# Patient Record
Sex: Female | Born: 1975 | Race: White | Hispanic: No | Marital: Married | Smoking: Never smoker
Health system: Southern US, Community
[De-identification: ages and names within clinical notes are randomized; demographics above are authoritative.]

## PROBLEM LIST (undated history)

## (undated) DIAGNOSIS — D6859 Other primary thrombophilia: Secondary | ICD-10-CM

## (undated) DIAGNOSIS — F419 Anxiety disorder, unspecified: Secondary | ICD-10-CM

## (undated) DIAGNOSIS — I82409 Acute embolism and thrombosis of unspecified deep veins of unspecified lower extremity: Secondary | ICD-10-CM

## (undated) DIAGNOSIS — J939 Pneumothorax, unspecified: Secondary | ICD-10-CM

## (undated) DIAGNOSIS — D689 Coagulation defect, unspecified: Secondary | ICD-10-CM

## (undated) DIAGNOSIS — Z87442 Personal history of urinary calculi: Secondary | ICD-10-CM

## (undated) DIAGNOSIS — N189 Chronic kidney disease, unspecified: Secondary | ICD-10-CM

## (undated) HISTORY — DX: Pneumothorax, unspecified: J93.9

## (undated) HISTORY — DX: Coagulation defect, unspecified: D68.9

## (undated) HISTORY — DX: Other primary thrombophilia: D68.59

## (undated) HISTORY — DX: Chronic kidney disease, unspecified: N18.9

## (undated) HISTORY — DX: Anxiety disorder, unspecified: F41.9

## (undated) HISTORY — DX: Acute embolism and thrombosis of unspecified deep veins of unspecified lower extremity: I82.409

---
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
## 2005-07-18 ENCOUNTER — Ambulatory Visit: Payer: Self-pay | Admitting: Obstetrics and Gynecology

## 2005-07-28 ENCOUNTER — Ambulatory Visit: Payer: Self-pay | Admitting: Obstetrics and Gynecology

## 2005-11-20 ENCOUNTER — Inpatient Hospital Stay: Payer: Self-pay | Admitting: Obstetrics and Gynecology

## 2005-12-25 ENCOUNTER — Ambulatory Visit: Payer: Self-pay

## 2006-01-11 ENCOUNTER — Emergency Department: Payer: Self-pay | Admitting: Emergency Medicine

## 2006-01-12 ENCOUNTER — Ambulatory Visit: Payer: Self-pay | Admitting: Emergency Medicine

## 2006-03-23 ENCOUNTER — Ambulatory Visit: Payer: Self-pay | Admitting: Obstetrics and Gynecology

## 2006-05-19 ENCOUNTER — Inpatient Hospital Stay: Payer: Self-pay | Admitting: Obstetrics and Gynecology

## 2006-05-25 ENCOUNTER — Observation Stay: Payer: Self-pay | Admitting: Certified Nurse Midwife

## 2006-05-27 ENCOUNTER — Observation Stay: Payer: Self-pay

## 2006-05-28 ENCOUNTER — Observation Stay: Payer: Self-pay

## 2006-05-31 ENCOUNTER — Observation Stay: Payer: Self-pay

## 2006-06-01 ENCOUNTER — Ambulatory Visit: Payer: Self-pay

## 2006-06-09 ENCOUNTER — Ambulatory Visit: Payer: Self-pay | Admitting: Unknown Physician Specialty

## 2006-06-21 ENCOUNTER — Inpatient Hospital Stay: Payer: Self-pay | Admitting: Obstetrics and Gynecology

## 2006-06-24 ENCOUNTER — Ambulatory Visit: Payer: Self-pay | Admitting: Certified Nurse Midwife

## 2007-09-25 ENCOUNTER — Inpatient Hospital Stay: Payer: Self-pay | Admitting: Obstetrics and Gynecology

## 2007-10-03 ENCOUNTER — Encounter: Payer: Self-pay | Admitting: Maternal & Fetal Medicine

## 2007-10-10 ENCOUNTER — Encounter: Payer: Self-pay | Admitting: Maternal & Fetal Medicine

## 2008-02-02 ENCOUNTER — Observation Stay: Payer: Self-pay | Admitting: Obstetrics and Gynecology

## 2008-05-07 ENCOUNTER — Inpatient Hospital Stay: Payer: Self-pay | Admitting: Obstetrics and Gynecology

## 2010-05-19 ENCOUNTER — Ambulatory Visit: Payer: Self-pay | Admitting: Obstetrics and Gynecology

## 2010-05-25 ENCOUNTER — Ambulatory Visit: Payer: Self-pay | Admitting: Obstetrics and Gynecology

## 2010-10-04 ENCOUNTER — Ambulatory Visit: Payer: Self-pay | Admitting: Unknown Physician Specialty

## 2010-10-07 ENCOUNTER — Ambulatory Visit: Payer: Self-pay | Admitting: Otolaryngology

## 2010-10-13 ENCOUNTER — Ambulatory Visit: Payer: Self-pay | Admitting: Cardiothoracic Surgery

## 2010-10-14 LAB — AFP TUMOR MARKER: AFP-Tumor Marker: 1.6 ng/mL (ref 0.0–8.3)

## 2010-10-19 ENCOUNTER — Ambulatory Visit: Payer: Self-pay | Admitting: Cardiothoracic Surgery

## 2010-12-29 ENCOUNTER — Ambulatory Visit: Payer: Self-pay | Admitting: Cardiothoracic Surgery

## 2011-01-17 ENCOUNTER — Ambulatory Visit: Payer: Self-pay | Admitting: Cardiothoracic Surgery

## 2011-04-19 ENCOUNTER — Ambulatory Visit: Payer: Self-pay | Admitting: Oncology

## 2011-04-24 ENCOUNTER — Encounter: Payer: Self-pay | Admitting: Obstetrics and Gynecology

## 2011-04-30 ENCOUNTER — Inpatient Hospital Stay: Payer: Self-pay | Admitting: Obstetrics and Gynecology

## 2011-05-08 ENCOUNTER — Ambulatory Visit: Payer: Self-pay | Admitting: Obstetrics and Gynecology

## 2011-05-08 ENCOUNTER — Observation Stay: Payer: Self-pay | Admitting: Obstetrics and Gynecology

## 2011-05-20 ENCOUNTER — Ambulatory Visit: Payer: Self-pay | Admitting: Oncology

## 2011-05-25 ENCOUNTER — Ambulatory Visit: Payer: Self-pay | Admitting: Oncology

## 2011-06-01 ENCOUNTER — Encounter: Payer: Self-pay | Admitting: Obstetrics & Gynecology

## 2011-06-19 ENCOUNTER — Ambulatory Visit: Payer: Self-pay | Admitting: Oncology

## 2011-06-21 LAB — HM MAMMOGRAPHY: HM Mammogram: NORMAL

## 2011-07-20 ENCOUNTER — Ambulatory Visit: Payer: Self-pay | Admitting: Oncology

## 2011-08-13 ENCOUNTER — Observation Stay: Payer: Self-pay | Admitting: Obstetrics and Gynecology

## 2011-08-31 ENCOUNTER — Ambulatory Visit: Payer: Self-pay | Admitting: Oncology

## 2011-09-19 ENCOUNTER — Ambulatory Visit: Payer: Self-pay | Admitting: Oncology

## 2011-09-19 ENCOUNTER — Ambulatory Visit: Payer: Self-pay | Admitting: Internal Medicine

## 2011-10-20 ENCOUNTER — Ambulatory Visit: Payer: Self-pay | Admitting: Oncology

## 2011-11-17 ENCOUNTER — Ambulatory Visit: Payer: Self-pay | Admitting: Oncology

## 2011-12-01 ENCOUNTER — Inpatient Hospital Stay: Payer: Self-pay | Admitting: Obstetrics and Gynecology

## 2011-12-01 LAB — CBC WITH DIFFERENTIAL/PLATELET
Basophil %: 0.1 %
Eosinophil #: 0.1 10*3/uL (ref 0.0–0.7)
Eosinophil %: 0.6 %
Lymphocyte #: 1.7 10*3/uL (ref 1.0–3.6)
MCH: 26.6 pg (ref 26.0–34.0)
MCHC: 32.4 g/dL (ref 32.0–36.0)
MCV: 82 fL (ref 80–100)
Monocyte #: 0.7 10*3/uL (ref 0.0–0.7)
Neutrophil %: 76.7 %
Platelet: 226 10*3/uL (ref 150–440)
RBC: 4.61 10*6/uL (ref 3.80–5.20)
RDW: 14.8 % — ABNORMAL HIGH (ref 11.5–14.5)
WBC: 10.8 10*3/uL (ref 3.6–11.0)

## 2011-12-02 LAB — HEMATOCRIT: HCT: 33.9 % — ABNORMAL LOW (ref 35.0–47.0)

## 2012-02-15 ENCOUNTER — Ambulatory Visit: Payer: Self-pay | Admitting: Cardiothoracic Surgery

## 2012-02-15 ENCOUNTER — Ambulatory Visit: Payer: Self-pay | Admitting: Oncology

## 2012-02-17 ENCOUNTER — Ambulatory Visit: Payer: Self-pay

## 2012-02-17 ENCOUNTER — Ambulatory Visit: Payer: Self-pay | Admitting: Oncology

## 2012-05-09 ENCOUNTER — Ambulatory Visit: Payer: Self-pay | Admitting: Oncology

## 2012-05-19 ENCOUNTER — Ambulatory Visit: Payer: Self-pay | Admitting: Oncology

## 2012-06-18 ENCOUNTER — Ambulatory Visit: Payer: Self-pay | Admitting: Oncology

## 2012-06-20 LAB — HM PAP SMEAR: HM Pap smear: NORMAL

## 2012-09-18 DIAGNOSIS — N189 Chronic kidney disease, unspecified: Secondary | ICD-10-CM

## 2012-09-18 HISTORY — DX: Chronic kidney disease, unspecified: N18.9

## 2012-12-17 ENCOUNTER — Emergency Department: Payer: Self-pay | Admitting: Emergency Medicine

## 2012-12-17 LAB — COMPREHENSIVE METABOLIC PANEL
Albumin: 3.7 g/dL (ref 3.4–5.0)
Anion Gap: 10 (ref 7–16)
BUN: 10 mg/dL (ref 7–18)
Bilirubin,Total: 0.7 mg/dL (ref 0.2–1.0)
Calcium, Total: 8.2 mg/dL — ABNORMAL LOW (ref 8.5–10.1)
Chloride: 107 mmol/L (ref 98–107)
Co2: 22 mmol/L (ref 21–32)
EGFR (African American): >60
EGFR (Non-African Amer.): >60
Glucose: 120 mg/dL — ABNORMAL HIGH (ref 65–99)
Potassium: 3.3 mmol/L — ABNORMAL LOW (ref 3.5–5.1)
SGOT(AST): 22 U/L (ref 15–37)
Sodium: 139 mmol/L (ref 136–145)
Total Protein: 6.6 g/dL (ref 6.4–8.2)

## 2012-12-17 LAB — URINALYSIS, COMPLETE
Bacteria: NONE SEEN
Bilirubin,UR: NEGATIVE
Glucose,UR: NEGATIVE mg/dL (ref 0–75)
Leukocyte Esterase: NEGATIVE
Ph: 5 (ref 4.5–8.0)
Protein: 100
Specific Gravity: 1.017 (ref 1.003–1.030)
WBC UR: 10 /HPF (ref 0–5)

## 2012-12-17 LAB — PREGNANCY, URINE: Pregnancy Test, Urine: NEGATIVE m[IU]/mL

## 2012-12-17 LAB — CBC
HCT: 43.7 % (ref 35.0–47.0)
HGB: 14.9 g/dL (ref 12.0–16.0)
MCV: 89 fL (ref 80–100)
Platelet: 180 10*3/uL (ref 150–440)
RBC: 4.94 10*6/uL (ref 3.80–5.20)
RDW: 13.7 % (ref 11.5–14.5)
WBC: 5.4 10*3/uL (ref 3.6–11.0)

## 2012-12-17 LAB — LIPASE, BLOOD: Lipase: 124 U/L (ref 73–393)

## 2012-12-26 ENCOUNTER — Ambulatory Visit: Payer: Self-pay | Admitting: Cardiothoracic Surgery

## 2013-01-02 ENCOUNTER — Ambulatory Visit: Payer: Self-pay

## 2013-01-16 ENCOUNTER — Ambulatory Visit: Payer: Self-pay | Admitting: Cardiothoracic Surgery

## 2013-01-16 ENCOUNTER — Ambulatory Visit: Payer: Self-pay | Admitting: Oncology

## 2013-02-21 ENCOUNTER — Emergency Department: Payer: Self-pay | Admitting: Emergency Medicine

## 2013-02-21 LAB — BASIC METABOLIC PANEL
Anion Gap: 7 (ref 7–16)
Calcium, Total: 8.2 mg/dL — ABNORMAL LOW (ref 8.5–10.1)
Chloride: 108 mmol/L — ABNORMAL HIGH (ref 98–107)
Co2: 25 mmol/L (ref 21–32)
EGFR (Non-African Amer.): >60
Glucose: 88 mg/dL (ref 65–99)
Osmolality: 279 (ref 275–301)
Potassium: 3.3 mmol/L — ABNORMAL LOW (ref 3.5–5.1)
Sodium: 140 mmol/L (ref 136–145)

## 2013-02-21 LAB — CBC
HCT: 42.4 % (ref 35.0–47.0)
HGB: 14.4 g/dL (ref 12.0–16.0)
MCH: 29.8 pg (ref 26.0–34.0)
MCV: 88 fL (ref 80–100)
Platelet: 157 10*3/uL (ref 150–440)
RDW: 13.5 % (ref 11.5–14.5)
WBC: 5.1 10*3/uL (ref 3.6–11.0)

## 2013-02-21 LAB — TROPONIN I: Troponin-I: <0.02 ng/mL

## 2013-02-21 LAB — CK TOTAL AND CKMB (NOT AT ARMC): CK-MB: 1 ng/mL (ref 0.5–3.6)

## 2013-02-21 LAB — PROTIME-INR: Prothrombin Time: 13.7 secs (ref 11.5–14.7)

## 2013-06-11 ENCOUNTER — Ambulatory Visit: Payer: Self-pay | Admitting: Internal Medicine

## 2013-07-21 ENCOUNTER — Encounter (INDEPENDENT_AMBULATORY_CARE_PROVIDER_SITE_OTHER): Payer: Self-pay

## 2013-07-21 ENCOUNTER — Encounter: Payer: Self-pay | Admitting: Internal Medicine

## 2013-07-21 ENCOUNTER — Ambulatory Visit (INDEPENDENT_AMBULATORY_CARE_PROVIDER_SITE_OTHER): Payer: Managed Care, Other (non HMO) | Admitting: Internal Medicine

## 2013-07-21 VITALS — BP 102/76 | HR 69 | Temp 98.7°F | Resp 12 | Ht 61.0 in | Wt 111.5 lb

## 2013-07-21 DIAGNOSIS — Z13220 Encounter for screening for lipoid disorders: Secondary | ICD-10-CM

## 2013-07-21 DIAGNOSIS — Z86711 Personal history of pulmonary embolism: Secondary | ICD-10-CM | POA: Insufficient documentation

## 2013-07-21 DIAGNOSIS — R079 Chest pain, unspecified: Secondary | ICD-10-CM

## 2013-07-21 DIAGNOSIS — Z7901 Long term (current) use of anticoagulants: Secondary | ICD-10-CM

## 2013-07-21 DIAGNOSIS — Z1239 Encounter for other screening for malignant neoplasm of breast: Secondary | ICD-10-CM | POA: Insufficient documentation

## 2013-07-21 DIAGNOSIS — Z23 Encounter for immunization: Secondary | ICD-10-CM

## 2013-07-21 DIAGNOSIS — I499 Cardiac arrhythmia, unspecified: Secondary | ICD-10-CM | POA: Insufficient documentation

## 2013-07-21 DIAGNOSIS — D6859 Other primary thrombophilia: Secondary | ICD-10-CM

## 2013-07-21 DIAGNOSIS — E329 Disease of thymus, unspecified: Secondary | ICD-10-CM

## 2013-07-21 DIAGNOSIS — R5381 Other malaise: Secondary | ICD-10-CM

## 2013-07-21 LAB — CBC WITH DIFFERENTIAL/PLATELET
Basophils Absolute: 0 10*3/uL (ref 0.0–0.1)
Basophils Relative: 0.6 % (ref 0.0–3.0)
Eosinophils Absolute: 0.2 10*3/uL (ref 0.0–0.7)
HCT: 46.6 % — ABNORMAL HIGH (ref 36.0–46.0)
Lymphs Abs: 1.7 10*3/uL (ref 0.7–4.0)
MCHC: 34.3 g/dL (ref 30.0–36.0)
Monocytes Absolute: 0.3 10*3/uL (ref 0.1–1.0)
Monocytes Relative: 6.9 % (ref 3.0–12.0)
Neutro Abs: 2.8 10*3/uL (ref 1.4–7.7)
Neutrophils Relative %: 55.6 % (ref 43.0–77.0)
Platelets: 187 10*3/uL (ref 150.0–400.0)
RDW: 13.2 % (ref 11.5–14.6)
WBC: 5.1 10*3/uL (ref 4.5–10.5)

## 2013-07-21 LAB — LIPID PANEL
HDL: 54.5 mg/dL (ref >39.00)
LDL Cholesterol: 120 mg/dL — ABNORMAL HIGH (ref 0–99)
Total CHOL/HDL Ratio: 3
Triglycerides: 55 mg/dL (ref 0.0–149.0)
VLDL: 11 mg/dL (ref 0.0–40.0)

## 2013-07-21 LAB — COMPREHENSIVE METABOLIC PANEL
ALT: 17 U/L (ref 0–35)
AST: 26 U/L (ref 0–37)
Alkaline Phosphatase: 55 U/L (ref 39–117)
CO2: 26 mEq/L (ref 19–32)
Calcium: 8.8 mg/dL (ref 8.4–10.5)
Chloride: 106 mEq/L (ref 96–112)
Creatinine, Ser: 0.8 mg/dL (ref 0.4–1.2)
Sodium: 139 mEq/L (ref 135–145)
Total Bilirubin: 0.7 mg/dL (ref 0.3–1.2)
Total Protein: 6.9 g/dL (ref 6.0–8.3)

## 2013-07-21 LAB — BRAIN NATRIURETIC PEPTIDE: Pro B Natriuretic peptide (BNP): 12 pg/mL (ref 0.0–100.0)

## 2013-07-21 NOTE — Patient Instructions (Signed)
Baseline EKG to be done today   Referral to [facility]  Mammogram TBD   Fasting labs today

## 2013-07-21 NOTE — Assessment & Plan Note (Signed)
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  Referring to cardiology for evaluation .

## 2013-07-21 NOTE — Assessment & Plan Note (Signed)
Suggested by history of bradycardia occurring with exertion and tachycardia at rest.  Short PR interval noted on today's EKG.  Refer to Cardiology for evaluation .

## 2013-07-21 NOTE — Assessment & Plan Note (Signed)
Per patient, has had unchanged thymus gland enlargement for 20 years. 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

## 2013-07-21 NOTE — Assessment & Plan Note (Addendum)
complicated by pregnancy.  Prior DVT/PEs despite use of once daily Lovenox. Followed by tim Orlie Dakin

## 2013-07-21 NOTE — Progress Notes (Signed)
Patient ID: Sabrina Woodard, female   DOB: 1976-04-18, 37 y.o.   MRN: [id number]   Patient Active Problem List   Diagnosis Date Noted  . Recurrent chest pain 07/21/2013  . Arrhythmia 07/21/2013  . 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 Hx pulmonary embolism 07/21/2013  . History of pulmonary embolism 07/21/2013    Subjective:  CC:   Chief Complaint  Patient presents with  . Establish Care    HPI:   Sabrina Woodard is a 37 y.o. female who presents as a new patient to establish primary care with the chief complaint of  Chest pain , recurrent.  She has a history of Protein C deficiency with recurrent DVT/PE, last one Spring  2014 , who has been having episodes of  chest tightnessand pain with exertion  On and off since her last PE.Marland Kitchen  She went to the ER In June and PE was ruled out,  EKG done, but o other workjp done . She recalls that during last episode of evaluation she was tachycardic at rest but became bradycardic with exertion.  Last episode occurred about 3 weeks ago while playing basket ball with the kids.   Protein C deficiency was found in 1999 during pregnancy;  Did not miscarry but had subsequent miscarriages  X 2 while on coumadin  History of PE in 1999 and in  2014, recently event  occurred during a sedentary period despite use of once daily lovenox .  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 No prior biopsy or bronchoscopy.  Had serologic workup for MG and CA.   5 children , oldest is 15  hoemschooling the 3 youngest.   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 Urged to have primary care by GYN .  Was due in October for her annual. Clois Comber at [place],  No history of abnormals. Last mammogram  2012 at [facility]  No history of biopsies         Past Medical History  Diagnosis Date  . Chronic kidney disease 2014    kidney  stones  . Clotting disorder 1995    Protien C deficcency    Past Surgical History  Procedure Laterality Date  . Tonsillectomy and adenoidectomy Bilateral 1995    Family History  Problem Relation Age of Onset  . Hyperlipidemia Mother   . Hypertension Mother   . Cancer Father 42    prostate   . Hyperlipidemia Father   . Hypertension Father   . Stroke Maternal Grandfather   . Cancer Paternal Grandmother 60    breast ca   . COPD Paternal Grandfather     History   Social History  . Marital Status: Married    Spouse Name: N/A    Number of Children: N/A  . Years of Education: N/A   Occupational History  . Not on file.   Social History Main Topics  . Smoking status: Never Smoker   . Smokeless tobacco: Never Used  . Alcohol Use: No  . Drug Use: No  . Sexual Activity: Yes    Birth Control/ Protection: Other-see comments     Comment: husband has had a vasectomy   Other Topics Concern  . Not on file   Social History Narrative  . No narrative on file   Allergies  Allergen Reactions  .  Penicillins Anaphylaxis  . Sulfa Antibiotics Rash     Review of Systems:   Patient denies headache, fevers, malaise, unintentional weight loss, skin rash, eye pain, sinus congestion and sinus pain, sore throat, dysphagia,  hemoptysis , cough, dyspnea, wheezing, chest pain, palpitations, orthopnea, edema, abdominal pain, nausea, melena, diarrhea, constipation, flank pain, dysuria, hematuria, urinary  Frequency, nocturia, numbness, tingling, seizures,  Focal weakness, Loss of consciousness,  Tremor, insomnia, depression, anxiety, and suicidal ideation.     Objective:  BP 102/76  Pulse 69  Temp(Src) 98.7 F (37.1 C) (Oral)  Resp 12  Ht 5\' 1"  (1.549 m)  Wt 111 lb 8 oz (50.576 kg)  BMI 21.08 kg/m2  SpO2 99%  LMP 07/19/2013  General appearance: alert, cooperative and appears stated age Ears: normal TM's and external ear canals both ears Throat: lips, mucosa, and tongue normal;  teeth and gums normal Neck: no adenopathy, no carotid bruit, supple, symmetrical, trachea midline and thyroid not enlarged, symmetric, no tenderness/mass/nodules Back: symmetric, no curvature. ROM normal. No CVA tenderness. Lungs: clear to auscultation bilaterally Heart: regular rate and rhythm, S1, S2 normal, no murmur, click, rub or gallop Abdomen: soft, non-tender; bowel sounds normal; no masses,  no organomegaly Pulses: 2+ and symmetric Skin: Skin color, texture, turgor normal. No rashes or lesions Lymph nodes: Cervical, supraclavicular, and axillary nodes normal.  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  Referring to cardiology for evaluation .   Arrhythmia Suggested by history of bradycardia occurring with exertion and tachycardia at rest.  Short PR interval noted on today's EKG.  Refer to Cardiology for evaluation .   Protein C deficiency complicated by pregnancy.  Prior DVT/PEs despite use of once daily Lovenox. Followed by tim Finnegan  History of pulmonary embolism Secondary to protein c deficiency . complicated by pregnancy .  Continue lovenox daily.  Patient has prevented pregnancy by having husband treated with vasectomy.  Unspecified disease of thymus gland Per patient, has had unchanged thymus gland enlargement for 20 years. Prior workup for MG and thyroid Ca negative   Breast cancer screening Advised to contineu annual mammograms,.. Overdue now ,.  Ordered.    Updated Medication List Outpatient Encounter Prescriptions as of 07/21/2013  Medication Sig  . enoxaparin (LOVENOX) 80 MG/0.8ML injection Inject 80 mg into the skin daily.

## 2013-07-21 NOTE — Assessment & Plan Note (Signed)
Advised to contineu annual mammograms,.. Overdue now ,.  Ordered.

## 2013-07-21 NOTE — Assessment & Plan Note (Addendum)
Secondary to protein c deficiency . complicated by pregnancy .  Continue lovenox daily.  Patient has prevented pregnancy by having husband treated with vasectomy.

## 2013-07-22 ENCOUNTER — Encounter: Payer: Self-pay | Admitting: *Deleted

## 2013-07-23 ENCOUNTER — Encounter: Payer: Self-pay | Admitting: Cardiovascular Disease

## 2013-07-23 ENCOUNTER — Ambulatory Visit (INDEPENDENT_AMBULATORY_CARE_PROVIDER_SITE_OTHER): Payer: Managed Care, Other (non HMO) | Admitting: Cardiovascular Disease

## 2013-07-23 VITALS — BP 116/82 | HR 69 | Ht 61.0 in | Wt 111.5 lb

## 2013-07-23 DIAGNOSIS — R079 Chest pain, unspecified: Secondary | ICD-10-CM

## 2013-07-23 DIAGNOSIS — R002 Palpitations: Secondary | ICD-10-CM

## 2013-07-23 DIAGNOSIS — Z86711 Personal history of pulmonary embolism: Secondary | ICD-10-CM

## 2013-07-23 NOTE — Assessment & Plan Note (Signed)
Sabrina Woodard presents today for further evaluation of 2 episodes of chest discomfort. These occurred when she was playing with her children. Suspected she may have some mild pulmonary hypertension due to her previous pulmonary block. Tuesday doubt that she has any ischemic heart disease.  We'll get an echocardiogram for further evaluation of her left ventricular function, right ventricular size and function and her pulmonary pressures. She's currently on daily Lovenox. I questioned her if she ever consider starting one of the new anticoagulants which should be a perfect to use in protein C deficiency.  I will see her  back in the office in 3 months.

## 2013-07-23 NOTE — Progress Notes (Signed)
     Sabrina Woodard Date of Birth  22-Jul-1976       [facility] [address]  [address] [place], [place]  [address]   [place], [place]  [address] [phone]     [phone]   Fax  [phone]    Fax [phone]  Problem List: 1. Protein C deficiency-history of recurrent pulmonary 2. Chest pain  History of Present Illness:  Sabrina Woodard is a 37 yo who is referred for evaluation of chest pain while exercising.  The pain was mild.  The pain lasted about 5-10 minutes.  She has occasional episodes at rest. She had some pleuretic cp, no dizziness.   She has been noted to have resting tachycardia.    She has had a similar episode while playing basketball with her children.  She had 3 DVT's ( 1 associated with birth control, 2  associated with her pregnancies)  She had a pulmonary embolus this past April 9 when her lovenox shipment had been delayed)   She is very active on a normal basis. She typically doesn't have any limitations) with her 5 children.  Current Outpatient Prescriptions on File Prior to Visit  Medication Sig Dispense Refill  . enoxaparin (LOVENOX) 80 MG/0.8ML injection Inject 80 mg into the skin daily.       No current facility-administered medications on file prior to visit.    Allergies  Allergen Reactions  . Penicillins Anaphylaxis  . Sulfa Antibiotics Rash    Past Medical History  Diagnosis Date  . Chronic kidney disease 2014    kidney stones  . Clotting disorder 1995    Protien C deficcency    Past Surgical History  Procedure Laterality Date  . Tonsillectomy and adenoidectomy Bilateral 1995    History  Smoking status  . Never Smoker   Smokeless tobacco  . Never Used    History  Alcohol Use No    Family History  Problem Relation Age of Onset  . Hyperlipidemia Mother   . Hypertension Mother   . Cancer Father 41    prostate   . Hyperlipidemia Father   . Hypertension Father   . Stroke  Maternal Grandfather   . Cancer Paternal Grandmother 39    breast ca   . COPD Paternal Grandfather     Reviw of Systems:  Reviewed in the HPI.  All other systems are negative.  Physical Exam: Blood pressure 116/82, pulse 69, height 5\' 1"  (1.549 m), weight 111 lb 8 oz (50.576 kg), last menstrual period 07/19/2013. General: Well developed, well nourished, in no acute distress.  Head: Normocephalic, atraumatic, sclera non-icteric, mucus membranes are moist,   Neck: Supple. Carotids are 2 + without bruits. No JVD   Lungs: Clear   Heart: RR, normal S1, loud S2, soft systolic murmur, no RV heave, pmi is non displaced  Abdomen: Soft, non-tender, non-distended with normal bowel sounds.   i was able to palpate her abdominal aorta  Msk:  Strength and tone are normal   Extremities: No clubbing or cyanosis. No edema.  Distal pedal pulses are 2+ and equal    Neuro: CN II - XII intact.  Alert and oriented X 3.   Psych:  Normal   ECG: Nov. 5, 2014:  NSR at 69,  NS ST abnormalities in the lateral leads.   Assessment / Plan:

## 2013-07-23 NOTE — Patient Instructions (Signed)
PLEASE SCHEDULE TO HAVE AN ECHO; DX H/O PE  FOLLOW UP WITH DR. Elease Hashimoto IN 3 MONTHS  NO CHANGES WERE MADE WITH YOUR MEDICATIONS TODAY

## 2013-08-05 ENCOUNTER — Ambulatory Visit (INDEPENDENT_AMBULATORY_CARE_PROVIDER_SITE_OTHER): Payer: Managed Care, Other (non HMO) | Admitting: Cardiology

## 2013-08-05 DIAGNOSIS — R079 Chest pain, unspecified: Secondary | ICD-10-CM

## 2013-08-06 ENCOUNTER — Telehealth: Payer: Self-pay

## 2013-08-06 NOTE — Telephone Encounter (Signed)
Message copied by Marilynne Halsted on Wed Aug 06, 2013 11:22 AM ------      Message from: Antony Odea      Created: Wed Aug 06, 2013 10:04 AM                   ----- Message -----         From: Vesta Mixer, MD         Sent: 08/05/2013   5:50 PM           To: Antony Odea, RN            Normal echo       ------

## 2013-08-06 NOTE — Telephone Encounter (Signed)
Spoke w/ pt.  She is aware of results.  

## 2013-08-27 ENCOUNTER — Encounter: Payer: Self-pay | Admitting: Internal Medicine

## 2013-08-27 DIAGNOSIS — D6859 Other primary thrombophilia: Secondary | ICD-10-CM

## 2013-08-29 ENCOUNTER — Telehealth: Payer: Self-pay | Admitting: Emergency Medicine

## 2013-08-29 ENCOUNTER — Other Ambulatory Visit: Payer: Self-pay | Admitting: Family Medicine

## 2013-08-29 DIAGNOSIS — Z86711 Personal history of pulmonary embolism: Secondary | ICD-10-CM

## 2013-08-29 MED ORDER — WARFARIN SODIUM 5 MG PO TABS: 5.0000 mg | ORAL_TABLET | Freq: Every day | ORAL | Status: DC

## 2013-08-29 NOTE — Telephone Encounter (Signed)
FYI

## 2013-08-29 NOTE — Telephone Encounter (Signed)
rx and message sent.

## 2013-08-29 NOTE — Telephone Encounter (Addendum)
I spoke with Sabrina Woodard @ [facility] who manages the coumadin clinic, she states we need to write the patient a script for coumadin for 5mg /day to take along with the the Lovenox. She needs to start this asap. This will help wean her. Sabrina Woodard will see the patient next Thursday 12/18 @ 4pm to determine when the pt needs to stop taking the Lovenox. She will instruct pt from that point.       I have also LVM for patient to call our office about apt with [facility] on 09/04/13 @ 4pm. Arriving @ 345pm.

## 2013-09-04 ENCOUNTER — Ambulatory Visit (INDEPENDENT_AMBULATORY_CARE_PROVIDER_SITE_OTHER): Payer: Managed Care, Other (non HMO) | Admitting: Family Medicine

## 2013-09-04 DIAGNOSIS — Z86711 Personal history of pulmonary embolism: Secondary | ICD-10-CM

## 2013-09-04 DIAGNOSIS — D6859 Other primary thrombophilia: Secondary | ICD-10-CM

## 2013-09-08 ENCOUNTER — Ambulatory Visit: Payer: Managed Care, Other (non HMO)

## 2013-09-08 ENCOUNTER — Ambulatory Visit (INDEPENDENT_AMBULATORY_CARE_PROVIDER_SITE_OTHER): Payer: Managed Care, Other (non HMO) | Admitting: Family Medicine

## 2013-09-08 DIAGNOSIS — D6859 Other primary thrombophilia: Secondary | ICD-10-CM

## 2013-09-08 DIAGNOSIS — Z86711 Personal history of pulmonary embolism: Secondary | ICD-10-CM

## 2013-09-10 ENCOUNTER — Ambulatory Visit (INDEPENDENT_AMBULATORY_CARE_PROVIDER_SITE_OTHER): Payer: Managed Care, Other (non HMO) | Admitting: Family Medicine

## 2013-09-10 DIAGNOSIS — Z86711 Personal history of pulmonary embolism: Secondary | ICD-10-CM

## 2013-09-10 DIAGNOSIS — D6859 Other primary thrombophilia: Secondary | ICD-10-CM

## 2013-09-17 ENCOUNTER — Ambulatory Visit (INDEPENDENT_AMBULATORY_CARE_PROVIDER_SITE_OTHER): Payer: Managed Care, Other (non HMO) | Admitting: Family Medicine

## 2013-09-17 DIAGNOSIS — D6859 Other primary thrombophilia: Secondary | ICD-10-CM

## 2013-09-17 DIAGNOSIS — Z86711 Personal history of pulmonary embolism: Secondary | ICD-10-CM

## 2013-09-17 LAB — POCT INR: INR: 1.2

## 2013-09-19 ENCOUNTER — Ambulatory Visit: Payer: Managed Care, Other (non HMO)

## 2013-10-02 ENCOUNTER — Ambulatory Visit: Payer: Managed Care, Other (non HMO)

## 2013-10-09 ENCOUNTER — Ambulatory Visit (INDEPENDENT_AMBULATORY_CARE_PROVIDER_SITE_OTHER): Payer: Managed Care, Other (non HMO) | Admitting: Family Medicine

## 2013-10-09 DIAGNOSIS — D6859 Other primary thrombophilia: Secondary | ICD-10-CM

## 2013-10-09 DIAGNOSIS — Z86711 Personal history of pulmonary embolism: Secondary | ICD-10-CM

## 2013-10-09 LAB — POCT INR: INR: 1.5

## 2013-10-23 ENCOUNTER — Ambulatory Visit: Payer: Managed Care, Other (non HMO) | Admitting: Cardiovascular Disease

## 2013-10-30 ENCOUNTER — Ambulatory Visit (INDEPENDENT_AMBULATORY_CARE_PROVIDER_SITE_OTHER): Payer: Managed Care, Other (non HMO) | Admitting: Family Medicine

## 2013-10-30 DIAGNOSIS — D6859 Other primary thrombophilia: Secondary | ICD-10-CM

## 2013-10-30 DIAGNOSIS — Z5181 Encounter for therapeutic drug level monitoring: Secondary | ICD-10-CM

## 2013-10-30 DIAGNOSIS — Z86711 Personal history of pulmonary embolism: Secondary | ICD-10-CM

## 2013-10-30 LAB — POCT INR: INR: 1.3

## 2013-12-01 ENCOUNTER — Ambulatory Visit: Payer: Self-pay | Admitting: Family Medicine

## 2013-12-01 ENCOUNTER — Encounter: Payer: Self-pay | Admitting: Internal Medicine

## 2013-12-01 ENCOUNTER — Ambulatory Visit: Payer: Managed Care, Other (non HMO)

## 2013-12-01 DIAGNOSIS — Z5181 Encounter for therapeutic drug level monitoring: Secondary | ICD-10-CM

## 2013-12-01 DIAGNOSIS — D6859 Other primary thrombophilia: Secondary | ICD-10-CM

## 2013-12-08 ENCOUNTER — Ambulatory Visit (INDEPENDENT_AMBULATORY_CARE_PROVIDER_SITE_OTHER): Payer: Managed Care, Other (non HMO) | Admitting: Family Medicine

## 2013-12-08 DIAGNOSIS — D6859 Other primary thrombophilia: Secondary | ICD-10-CM

## 2013-12-08 DIAGNOSIS — Z5181 Encounter for therapeutic drug level monitoring: Secondary | ICD-10-CM

## 2013-12-08 DIAGNOSIS — Z86711 Personal history of pulmonary embolism: Secondary | ICD-10-CM

## 2013-12-08 LAB — POCT INR: INR: 1.8

## 2013-12-09 ENCOUNTER — Encounter: Payer: Self-pay | Admitting: Internal Medicine

## 2013-12-11 NOTE — Telephone Encounter (Signed)
Unread mychart message mailed  

## 2013-12-29 ENCOUNTER — Other Ambulatory Visit: Payer: Self-pay | Admitting: Internal Medicine

## 2014-01-05 ENCOUNTER — Ambulatory Visit: Payer: Managed Care, Other (non HMO)

## 2014-01-09 ENCOUNTER — Ambulatory Visit: Payer: Managed Care, Other (non HMO)

## 2014-01-15 ENCOUNTER — Ambulatory Visit (INDEPENDENT_AMBULATORY_CARE_PROVIDER_SITE_OTHER): Payer: Managed Care, Other (non HMO) | Admitting: Family Medicine

## 2014-01-15 DIAGNOSIS — Z5181 Encounter for therapeutic drug level monitoring: Secondary | ICD-10-CM

## 2014-01-15 DIAGNOSIS — Z86711 Personal history of pulmonary embolism: Secondary | ICD-10-CM

## 2014-01-15 DIAGNOSIS — D6859 Other primary thrombophilia: Secondary | ICD-10-CM

## 2014-01-15 LAB — POCT INR: INR: 1.7

## 2014-02-12 ENCOUNTER — Ambulatory Visit: Payer: Managed Care, Other (non HMO)

## 2014-02-16 ENCOUNTER — Ambulatory Visit (INDEPENDENT_AMBULATORY_CARE_PROVIDER_SITE_OTHER): Payer: Managed Care, Other (non HMO) | Admitting: Family Medicine

## 2014-02-16 DIAGNOSIS — D6859 Other primary thrombophilia: Secondary | ICD-10-CM

## 2014-02-16 DIAGNOSIS — Z86711 Personal history of pulmonary embolism: Secondary | ICD-10-CM

## 2014-02-16 DIAGNOSIS — Z5181 Encounter for therapeutic drug level monitoring: Secondary | ICD-10-CM

## 2014-02-16 LAB — POCT INR: INR: 2.7

## 2014-03-18 ENCOUNTER — Encounter: Payer: Self-pay | Admitting: Internal Medicine

## 2014-03-19 ENCOUNTER — Other Ambulatory Visit: Payer: Self-pay | Admitting: General Practice

## 2014-03-19 MED ORDER — WARFARIN SODIUM 5 MG PO TABS: ORAL_TABLET | ORAL | Status: DC

## 2014-03-30 ENCOUNTER — Ambulatory Visit (INDEPENDENT_AMBULATORY_CARE_PROVIDER_SITE_OTHER): Payer: Managed Care, Other (non HMO) | Admitting: Family Medicine

## 2014-03-30 DIAGNOSIS — D6859 Other primary thrombophilia: Secondary | ICD-10-CM

## 2014-03-30 DIAGNOSIS — Z5181 Encounter for therapeutic drug level monitoring: Secondary | ICD-10-CM

## 2014-03-30 DIAGNOSIS — Z86711 Personal history of pulmonary embolism: Secondary | ICD-10-CM

## 2014-03-30 LAB — POCT INR: INR: 3.1

## 2014-05-11 ENCOUNTER — Ambulatory Visit: Payer: Managed Care, Other (non HMO)

## 2014-05-12 ENCOUNTER — Telehealth: Payer: Self-pay | Admitting: Internal Medicine

## 2014-05-12 NOTE — Telephone Encounter (Signed)
I am not managing her coumadin, but the patient needs to be contacted in the morning and rescheduled ASAP

## 2014-05-12 NOTE — Telephone Encounter (Signed)
Patient did not come for their scheduled appointment yesterday 04/2514 with coumadin clinic  Please let me know if the patient needs to be contacted immediately for follow up or if no follow up is necessary.

## 2014-05-13 NOTE — Telephone Encounter (Signed)
Robin-if you could call her and try to get her in tomorrow that'd be great, if not, as soon as she can.  Thanks!

## 2014-05-13 NOTE — Telephone Encounter (Signed)
Appointment 8/31 pt aware

## 2014-05-13 NOTE — Telephone Encounter (Signed)
Sabrina Sax do you want me to call ms Lippman to r/s or do you call the patient

## 2014-05-18 ENCOUNTER — Ambulatory Visit (INDEPENDENT_AMBULATORY_CARE_PROVIDER_SITE_OTHER): Payer: Managed Care, Other (non HMO) | Admitting: Family Medicine

## 2014-05-18 DIAGNOSIS — D6859 Other primary thrombophilia: Secondary | ICD-10-CM

## 2014-05-18 DIAGNOSIS — Z5181 Encounter for therapeutic drug level monitoring: Secondary | ICD-10-CM

## 2014-05-18 DIAGNOSIS — Z86711 Personal history of pulmonary embolism: Secondary | ICD-10-CM

## 2014-05-18 LAB — POCT INR: INR: 2.6

## 2014-06-29 ENCOUNTER — Ambulatory Visit (INDEPENDENT_AMBULATORY_CARE_PROVIDER_SITE_OTHER): Payer: Managed Care, Other (non HMO) | Admitting: *Deleted

## 2014-06-29 DIAGNOSIS — Z5181 Encounter for therapeutic drug level monitoring: Secondary | ICD-10-CM

## 2014-06-29 DIAGNOSIS — D6859 Other primary thrombophilia: Secondary | ICD-10-CM

## 2014-06-29 DIAGNOSIS — Z86711 Personal history of pulmonary embolism: Secondary | ICD-10-CM

## 2014-06-29 LAB — POCT INR: INR: 2.5

## 2014-08-10 ENCOUNTER — Other Ambulatory Visit: Payer: Managed Care, Other (non HMO)

## 2014-08-10 ENCOUNTER — Other Ambulatory Visit (INDEPENDENT_AMBULATORY_CARE_PROVIDER_SITE_OTHER): Payer: Managed Care, Other (non HMO)

## 2014-08-10 ENCOUNTER — Ambulatory Visit: Payer: Managed Care, Other (non HMO)

## 2014-08-10 DIAGNOSIS — Z86711 Personal history of pulmonary embolism: Secondary | ICD-10-CM

## 2014-08-10 DIAGNOSIS — Z5181 Encounter for therapeutic drug level monitoring: Secondary | ICD-10-CM

## 2014-08-11 LAB — PROTIME-INR
INR: 1.8 ratio — ABNORMAL HIGH (ref 0.8–1.0)
Prothrombin Time: 19.5 s — ABNORMAL HIGH (ref 9.6–13.1)

## 2014-09-03 ENCOUNTER — Encounter: Payer: Self-pay | Admitting: Internal Medicine

## 2014-09-25 ENCOUNTER — Ambulatory Visit (INDEPENDENT_AMBULATORY_CARE_PROVIDER_SITE_OTHER): Payer: Managed Care, Other (non HMO) | Admitting: Nurse Practitioner

## 2014-09-25 ENCOUNTER — Encounter: Payer: Self-pay | Admitting: Nurse Practitioner

## 2014-09-25 VITALS — BP 118/82 | HR 84 | Temp 98.1°F | Resp 12 | Ht 61.0 in | Wt 116.0 lb

## 2014-09-25 DIAGNOSIS — J069 Acute upper respiratory infection, unspecified: Secondary | ICD-10-CM

## 2014-09-25 DIAGNOSIS — Z5181 Encounter for therapeutic drug level monitoring: Secondary | ICD-10-CM

## 2014-09-25 MED ORDER — WARFARIN SODIUM 5 MG PO TABS: ORAL_TABLET | ORAL | Status: DC

## 2014-09-25 NOTE — Progress Notes (Signed)
Subjective:    Patient ID: Sabrina Woodard, female    DOB: [birth year], 39 y.o.   MRN: [id number]  HPI  Sabrina Woodard is a 39 yo female with a CC of dizziness, nasal congestion, and refill of Coumadin.   1) Not scheduled appointment for coumadin clinic, but has not heard from them either. She was instructed by a staff member on 09/03/14 to follow up with the Coumadin clinic and she has not heard back she states. [facility].   Taking each day, missed 2 doses during holidays, taking 5 mg daily M,W,F, takes 7.5 T, TH, Sat,Sun  2) x 2 weeks "Feels foggy" and cold   Gets down in floor to play with kids and then standing feels dizzy, happenning daily. Denies room spinning or losing balance.  Aleve- helpful Mucinex DM-helpful   Daughter was sick prior to this  Clear rhinorrhea, green sputum from chest  Review of Systems  HENT: Positive for congestion, postnasal drip and rhinorrhea. Negative for ear discharge, ear pain, sinus pressure, sneezing, sore throat, tinnitus, trouble swallowing and voice change.   Eyes: Positive for visual disturbance.       Feels vision is blurry consistently   Respiratory: Positive for cough. Negative for chest tightness, shortness of breath and wheezing.   Cardiovascular: Negative for chest pain, palpitations and leg swelling.  Gastrointestinal: Negative for nausea, vomiting and diarrhea.  Skin: Negative for rash.  Neurological: Positive for dizziness and headaches.       Headache yesterday   Past Medical History  Diagnosis Date  . Chronic kidney disease 2014    kidney stones  . Clotting disorder 1995    Protien C deficcency    History   Social History  . Marital Status: Married    Spouse Name: N/A    Number of Children: N/A  . Years of Education: N/A   Occupational History  . Not on file.   Social History Main Topics  . Smoking status: Never Smoker   . Smokeless tobacco: Never Used  . Alcohol Use: No  . Drug Use: No    . Sexual Activity: Yes    Birth Control/ Protection: Other-see comments     Comment: husband has had a vasectomy   Other Topics Concern  . Not on file   Social History Narrative    Past Surgical History  Procedure Laterality Date  . Tonsillectomy and adenoidectomy Bilateral 1995    Family History  Problem Relation Age of Onset  . Hyperlipidemia Mother   . Hypertension Mother   . Cancer Father 7060    prostate   . Hyperlipidemia Father   . Hypertension Father   . Stroke Maternal Grandfather   . Cancer Paternal Grandmother [id number]    breast ca   . COPD Paternal Grandfather     Allergies  Allergen Reactions  . Penicillins Anaphylaxis  . Sulfa Antibiotics Rash    Current Outpatient Prescriptions on File Prior to Visit  Medication Sig Dispense Refill  . warfarin (COUMADIN) 5 MG tablet Take as directed by anticoagulation clinic 45 tablet 3   No current facility-administered medications on file prior to visit.      Objective:   Physical Exam  Constitutional: She is oriented to person, place, and time. She appears well-developed and well-nourished. No distress.  HENT:  Head: Normocephalic and atraumatic.  Right Ear: External ear normal.  Left Ear: External ear normal.  Mouth/Throat: Oropharynx is clear and moist. No oropharyngeal  exudate.  Eyes: Conjunctivae and EOM are normal. Pupils are equal, round, and reactive to light. Right eye exhibits no discharge. Left eye exhibits no discharge. No scleral icterus.  Neck: Normal range of motion. Neck supple. No thyromegaly present.  Cardiovascular: Normal rate, regular rhythm, normal heart sounds and intact distal pulses.  Exam reveals no gallop and no friction rub.   No murmur heard. Pulmonary/Chest: Effort normal and breath sounds normal. No respiratory distress. She has no wheezes. She has no rales. She exhibits no tenderness.  Lymphadenopathy:    She has no cervical adenopathy.  Neurological: She is alert and oriented to  person, place, and time. No cranial nerve deficit. She exhibits normal muscle tone. Coordination normal.  Skin: Skin is warm and dry. No rash noted. She is not diaphoretic.  Psychiatric: She has a normal mood and affect. Her behavior is normal. Judgment and thought content normal.   BP 118/82 mmHg  Pulse 84  Temp(Src) 98.1 F (36.7 C) (Oral)  Resp 12  Ht  (1.549 m)  Wt 116 lb (52.617 kg)  BMI 21.93 kg/m2  SpO2 98%      Assessment & Plan:

## 2014-09-25 NOTE — Progress Notes (Signed)
Pre visit review using our clinic review tool, if applicable. No additional management support is needed unless otherwise documented below in the visit note. 

## 2014-09-25 NOTE — Patient Instructions (Signed)
Your cough may be coming from post nasal drip (PND).  Post Nasal Drip can be helped by Benadryl. This is the most effective for drying you up but it is also the most sedating,  So try taking it at night.   Add Sudafed for congestions (nothing with acetaminophen in it!)  "DM" stands for dextromethorphan which is a cough suppressant.  The best/strongest available OTC cough suppressant is Delsym.   Consider using simply Saline to flush your sinuses twice daily when you have congestion to prevent sinus infections.  Please go to the coumadin clinic for check up.

## 2014-09-28 NOTE — Assessment & Plan Note (Signed)
Pt strongly encouraged needing to FU with Coumadin clinc. Refill given for coumadin 5 mg, but needs labs and instruction. She verbalized understanding. Follow.

## 2014-09-29 DIAGNOSIS — J069 Acute upper respiratory infection, unspecified: Secondary | ICD-10-CM | POA: Insufficient documentation

## 2014-09-29 NOTE — Assessment & Plan Note (Signed)
Stable. Probable for viral illness. OTC benadryl at night for PNDrip, mucinex during daytime, and sudafed for congestion. Instructed to stay hydrated and call us if fever of 101 or greater, not improving, or worsens.

## 2014-10-23 ENCOUNTER — Other Ambulatory Visit (INDEPENDENT_AMBULATORY_CARE_PROVIDER_SITE_OTHER): Payer: Managed Care, Other (non HMO)

## 2014-10-23 DIAGNOSIS — I499 Cardiac arrhythmia, unspecified: Secondary | ICD-10-CM

## 2014-10-23 DIAGNOSIS — Z86711 Personal history of pulmonary embolism: Secondary | ICD-10-CM

## 2014-10-23 DIAGNOSIS — Z7901 Long term (current) use of anticoagulants: Secondary | ICD-10-CM

## 2014-10-23 LAB — POCT INR: INR: 1.7

## 2014-10-26 ENCOUNTER — Encounter: Payer: Self-pay | Admitting: Internal Medicine

## 2014-11-12 ENCOUNTER — Other Ambulatory Visit: Payer: Self-pay | Admitting: Nurse Practitioner

## 2014-11-13 NOTE — Telephone Encounter (Signed)
Patient need refill of Coumadin, she would like to know if she could get like four more refills. Thank You

## 2015-01-01 ENCOUNTER — Other Ambulatory Visit (INDEPENDENT_AMBULATORY_CARE_PROVIDER_SITE_OTHER): Payer: Managed Care, Other (non HMO)

## 2015-01-01 DIAGNOSIS — Z7901 Long term (current) use of anticoagulants: Secondary | ICD-10-CM

## 2015-01-02 LAB — PROTIME-INR
INR: 1.36 (ref <1.50)
PROTHROMBIN TIME: 16.8 s — AB (ref 11.6–15.2)

## 2015-01-04 ENCOUNTER — Other Ambulatory Visit: Payer: Self-pay | Admitting: *Deleted

## 2015-01-04 MED ORDER — WARFARIN SODIUM 5 MG PO TABS: ORAL_TABLET | ORAL | Status: DC

## 2015-01-08 ENCOUNTER — Other Ambulatory Visit (INDEPENDENT_AMBULATORY_CARE_PROVIDER_SITE_OTHER): Payer: Managed Care, Other (non HMO)

## 2015-01-08 DIAGNOSIS — Z7901 Long term (current) use of anticoagulants: Secondary | ICD-10-CM | POA: Diagnosis not present

## 2015-01-08 LAB — PROTIME-INR
INR: 1.8 ratio — ABNORMAL HIGH (ref 0.8–1.0)
Prothrombin Time: 19.8 s — ABNORMAL HIGH (ref 9.6–13.1)

## 2015-01-11 MED ORDER — WARFARIN SODIUM 1 MG PO TABS: 1.0000 mg | ORAL_TABLET | Freq: Every day | ORAL | Status: DC

## 2015-01-11 NOTE — Progress Notes (Signed)
Script sent for coumadin 1 mg

## 2015-01-11 NOTE — Addendum Note (Signed)
Addended by: Dennie BibleAVIS, Peretz Thieme R on: 01/11/2015 02:53 PM   Modules accepted: Orders

## 2015-01-14 ENCOUNTER — Telehealth: Payer: Self-pay | Admitting: *Deleted

## 2015-01-14 DIAGNOSIS — Z7901 Long term (current) use of anticoagulants: Secondary | ICD-10-CM

## 2015-01-14 NOTE — Telephone Encounter (Signed)
Labs and dx?  

## 2015-01-15 ENCOUNTER — Other Ambulatory Visit (INDEPENDENT_AMBULATORY_CARE_PROVIDER_SITE_OTHER): Payer: Managed Care, Other (non HMO)

## 2015-01-15 DIAGNOSIS — Z7901 Long term (current) use of anticoagulants: Secondary | ICD-10-CM

## 2015-01-15 LAB — PROTIME-INR
INR: 2 ratio — AB (ref 0.8–1.0)
Prothrombin Time: 21.8 s — ABNORMAL HIGH (ref 9.6–13.1)

## 2015-01-17 ENCOUNTER — Telehealth: Payer: Self-pay | Admitting: Internal Medicine

## 2015-01-17 NOTE — Telephone Encounter (Signed)
Coumadin level is therapeutic,  Continue current regimen and repeat PT/INR in one month 

## 2015-01-18 NOTE — Telephone Encounter (Signed)
Patient notified of results and lab appointment set. Patient also requested appointment with MD for nausea and mild abdominal discomfort for Wednesday per patient requested was scheduled.

## 2015-01-18 NOTE — Telephone Encounter (Signed)
Left message for patient to return call to office. 

## 2015-01-20 ENCOUNTER — Ambulatory Visit: Payer: Managed Care, Other (non HMO) | Admitting: Internal Medicine

## 2015-02-11 ENCOUNTER — Other Ambulatory Visit: Payer: Managed Care, Other (non HMO)

## 2015-02-15 ENCOUNTER — Other Ambulatory Visit: Payer: Self-pay | Admitting: Internal Medicine

## 2015-02-16 NOTE — Telephone Encounter (Signed)
Pt missed last lab appt for a 2 week appt for INR and no showed with last visit with MD.

## 2015-02-17 NOTE — Telephone Encounter (Signed)
Ok to refill,  Refill sent  

## 2015-02-25 ENCOUNTER — Telehealth: Payer: Self-pay | Admitting: Internal Medicine

## 2015-03-03 ENCOUNTER — Other Ambulatory Visit (INDEPENDENT_AMBULATORY_CARE_PROVIDER_SITE_OTHER): Payer: Managed Care, Other (non HMO)

## 2015-03-03 DIAGNOSIS — Z7901 Long term (current) use of anticoagulants: Secondary | ICD-10-CM

## 2015-03-03 LAB — COMPREHENSIVE METABOLIC PANEL
ALK PHOS: 54 U/L (ref 39–117)
ALT: 14 U/L (ref 0–35)
AST: 23 U/L (ref 0–37)
Albumin: 4.2 g/dL (ref 3.5–5.2)
BILIRUBIN TOTAL: 0.5 mg/dL (ref 0.2–1.2)
BUN: 15 mg/dL (ref 6–23)
CO2: 24 meq/L (ref 19–32)
Calcium: 8.9 mg/dL (ref 8.4–10.5)
Chloride: 106 mEq/L (ref 96–112)
Creatinine, Ser: 1.04 mg/dL (ref 0.40–1.20)
GFR: 62.31 mL/min (ref >60.00)
Glucose, Bld: 87 mg/dL (ref 70–99)
Potassium: 4.9 mEq/L (ref 3.5–5.1)
Sodium: 137 mEq/L (ref 135–145)
Total Protein: 6.4 g/dL (ref 6.0–8.3)

## 2015-03-03 LAB — CBC WITH DIFFERENTIAL/PLATELET
BASOS ABS: 0 10*3/uL (ref 0.0–0.1)
Basophils Relative: 0.6 % (ref 0.0–3.0)
EOS PCT: 2.6 % (ref 0.0–5.0)
Eosinophils Absolute: 0.1 10*3/uL (ref 0.0–0.7)
HCT: 45.3 % (ref 36.0–46.0)
Hemoglobin: 15.2 g/dL — ABNORMAL HIGH (ref 12.0–15.0)
LYMPHS PCT: 18.4 % (ref 12.0–46.0)
Lymphs Abs: 1 10*3/uL (ref 0.7–4.0)
MCHC: 33.5 g/dL (ref 30.0–36.0)
MCV: 89 fl (ref 78.0–100.0)
Monocytes Absolute: 0.3 10*3/uL (ref 0.1–1.0)
Monocytes Relative: 5.7 % (ref 3.0–12.0)
NEUTROS ABS: 4 10*3/uL (ref 1.4–7.7)
Neutrophils Relative %: 72.7 % (ref 43.0–77.0)
Platelets: 160 10*3/uL (ref 150.0–400.0)
RBC: 5.1 Mil/uL (ref 3.87–5.11)
RDW: 13.7 % (ref 11.5–15.5)
WBC: 5.5 10*3/uL (ref 4.0–10.5)

## 2015-03-03 LAB — PROTIME-INR
INR: 3.6 ratio — AB (ref 0.8–1.0)
PROTHROMBIN TIME: 38.9 s — AB (ref 9.6–13.1)

## 2015-03-05 ENCOUNTER — Other Ambulatory Visit: Payer: Self-pay

## 2015-03-05 DIAGNOSIS — Z1329 Encounter for screening for other suspected endocrine disorder: Secondary | ICD-10-CM

## 2015-03-05 DIAGNOSIS — Z13220 Encounter for screening for lipoid disorders: Secondary | ICD-10-CM

## 2015-03-05 DIAGNOSIS — Z7901 Long term (current) use of anticoagulants: Secondary | ICD-10-CM

## 2015-03-05 DIAGNOSIS — Z5181 Encounter for therapeutic drug level monitoring: Secondary | ICD-10-CM

## 2015-03-12 ENCOUNTER — Other Ambulatory Visit (INDEPENDENT_AMBULATORY_CARE_PROVIDER_SITE_OTHER): Payer: Managed Care, Other (non HMO)

## 2015-03-12 DIAGNOSIS — Z1329 Encounter for screening for other suspected endocrine disorder: Secondary | ICD-10-CM | POA: Diagnosis not present

## 2015-03-12 DIAGNOSIS — Z7901 Long term (current) use of anticoagulants: Secondary | ICD-10-CM

## 2015-03-12 DIAGNOSIS — Z5181 Encounter for therapeutic drug level monitoring: Secondary | ICD-10-CM

## 2015-03-12 LAB — LIPID PANEL
CHOL/HDL RATIO: 4
CHOLESTEROL: 191 mg/dL (ref 0–200)
HDL: 53.1 mg/dL (ref >39.00)
LDL Cholesterol: 125 mg/dL — ABNORMAL HIGH (ref 0–99)
NonHDL: 137.9
Triglycerides: 66 mg/dL (ref 0.0–149.0)
VLDL: 13.2 mg/dL (ref 0.0–40.0)

## 2015-03-12 LAB — CBC WITH DIFFERENTIAL/PLATELET
BASOS ABS: 0 10*3/uL (ref 0.0–0.1)
Basophils Relative: 0.8 % (ref 0.0–3.0)
EOS PCT: 4.4 % (ref 0.0–5.0)
Eosinophils Absolute: 0.2 10*3/uL (ref 0.0–0.7)
HCT: 45.7 % (ref 36.0–46.0)
Hemoglobin: 15.3 g/dL — ABNORMAL HIGH (ref 12.0–15.0)
LYMPHS ABS: 1.4 10*3/uL (ref 0.7–4.0)
LYMPHS PCT: 30.7 % (ref 12.0–46.0)
MCHC: 33.5 g/dL (ref 30.0–36.0)
MCV: 88.9 fl (ref 78.0–100.0)
MONOS PCT: 7.3 % (ref 3.0–12.0)
Monocytes Absolute: 0.3 10*3/uL (ref 0.1–1.0)
NEUTROS ABS: 2.5 10*3/uL (ref 1.4–7.7)
Neutrophils Relative %: 56.8 % (ref 43.0–77.0)
Platelets: 168 10*3/uL (ref 150.0–400.0)
RBC: 5.14 Mil/uL — ABNORMAL HIGH (ref 3.87–5.11)
RDW: 13.4 % (ref 11.5–15.5)
WBC: 4.5 10*3/uL (ref 4.0–10.5)

## 2015-03-12 LAB — PROTIME-INR
INR: 2.3 ratio — AB (ref 0.8–1.0)
Prothrombin Time: 25.3 s — ABNORMAL HIGH (ref 9.6–13.1)

## 2015-03-12 LAB — COMPREHENSIVE METABOLIC PANEL
ALT: 15 U/L (ref 0–35)
AST: 25 U/L (ref 0–37)
Albumin: 4.1 g/dL (ref 3.5–5.2)
Alkaline Phosphatase: 59 U/L (ref 39–117)
BUN: 12 mg/dL (ref 6–23)
CO2: 28 meq/L (ref 19–32)
Calcium: 8.8 mg/dL (ref 8.4–10.5)
Chloride: 105 mEq/L (ref 96–112)
Creatinine, Ser: 0.98 mg/dL (ref 0.40–1.20)
GFR: 66.72 mL/min (ref >60.00)
GLUCOSE: 87 mg/dL (ref 70–99)
Potassium: 4.3 mEq/L (ref 3.5–5.1)
Sodium: 138 mEq/L (ref 135–145)
TOTAL PROTEIN: 6.6 g/dL (ref 6.0–8.3)
Total Bilirubin: 0.6 mg/dL (ref 0.2–1.2)

## 2015-03-12 LAB — TSH: TSH: 1.64 u[IU]/mL (ref 0.35–4.50)

## 2015-03-15 ENCOUNTER — Encounter: Payer: Self-pay | Admitting: *Deleted

## 2015-03-29 ENCOUNTER — Other Ambulatory Visit: Payer: Self-pay | Admitting: Internal Medicine

## 2015-03-31 ENCOUNTER — Telehealth: Payer: Self-pay | Admitting: Internal Medicine

## 2015-03-31 ENCOUNTER — Emergency Department
Admission: EM | Admit: 2015-03-31 | Discharge: 2015-03-31 | Disposition: A | Discharge status: Home / Self Care | Payer: Managed Care, Other (non HMO) | Attending: Emergency Medicine | Admitting: Emergency Medicine

## 2015-03-31 ENCOUNTER — Emergency Department: Payer: Managed Care, Other (non HMO)

## 2015-03-31 ENCOUNTER — Encounter: Payer: Self-pay | Admitting: Emergency Medicine

## 2015-03-31 ENCOUNTER — Other Ambulatory Visit: Payer: Self-pay

## 2015-03-31 DIAGNOSIS — N189 Chronic kidney disease, unspecified: Secondary | ICD-10-CM | POA: Insufficient documentation

## 2015-03-31 DIAGNOSIS — R0789 Other chest pain: Secondary | ICD-10-CM | POA: Diagnosis present

## 2015-03-31 DIAGNOSIS — R0602 Shortness of breath: Secondary | ICD-10-CM | POA: Insufficient documentation

## 2015-03-31 DIAGNOSIS — R0781 Pleurodynia: Secondary | ICD-10-CM | POA: Insufficient documentation

## 2015-03-31 DIAGNOSIS — Z880 Allergy status to penicillin: Secondary | ICD-10-CM | POA: Insufficient documentation

## 2015-03-31 LAB — CBC
HEMATOCRIT: 44.7 % (ref 35.0–47.0)
HEMOGLOBIN: 15.1 g/dL (ref 12.0–16.0)
MCH: 30.1 pg (ref 26.0–34.0)
MCHC: 33.7 g/dL (ref 32.0–36.0)
MCV: 89.3 fL (ref 80.0–100.0)
Platelets: 152 10*3/uL (ref 150–440)
RBC: 5.01 MIL/uL (ref 3.80–5.20)
RDW: 13.8 % (ref 11.5–14.5)
WBC: 7.8 10*3/uL (ref 3.6–11.0)

## 2015-03-31 LAB — PROTIME-INR
INR: 1.37
PROTHROMBIN TIME: 17.1 s — AB (ref 11.4–15.0)

## 2015-03-31 LAB — TROPONIN I

## 2015-03-31 LAB — BASIC METABOLIC PANEL
ANION GAP: 7 (ref 5–15)
BUN: 14 mg/dL (ref 6–20)
CO2: 24 mmol/L (ref 22–32)
CREATININE: 1.07 mg/dL — AB (ref 0.44–1.00)
Calcium: 8.5 mg/dL — ABNORMAL LOW (ref 8.9–10.3)
Chloride: 107 mmol/L (ref 101–111)
Glucose, Bld: 99 mg/dL (ref 65–99)
Potassium: 4.3 mmol/L (ref 3.5–5.1)
Sodium: 138 mmol/L (ref 135–145)

## 2015-03-31 MED ORDER — ENOXAPARIN SODIUM 60 MG/0.6ML ~~LOC~~ SOLN
50.0000 mg | Freq: Once | SUBCUTANEOUS | Status: AC
  Administered 2015-03-31: 50 mg via SUBCUTANEOUS
  Filled 2015-03-31: qty 0.6

## 2015-03-31 MED ORDER — IOHEXOL 350 MG/ML SOLN
75.0000 mL | Freq: Once | INTRAVENOUS | Status: AC | PRN
  Administered 2015-03-31: 75 mL via INTRAVENOUS
  Filled 2015-03-31: qty 75

## 2015-03-31 NOTE — Discharge Instructions (Signed)
Pleurodynia °Pleurodynia is a sharp pain in the muscles between your ribs (intercostal muscles). This condition makes it painful to breathe. Pleurodynia is sometimes described as an iron grip around the rib cage. Pleurodynia attacks are unpredictable. °CAUSES  °Pleurodynia is commonly caused by a viral infection. A virus, called coxsackievirus B, attacks the intercostal muscles. However, getting pleurodynia from this virus is rare. Most people infected with coxsackievirus B have no symptoms. In some people, the virus causes a mild sore throat, cough, or diarrhea. Coxsackievirus B can live in body fluids, such as saliva and mucus. It is easily spread from person to person through coughing or sneezing. Coming in contact with the stool of an infected person can also spread the virus. °SYMPTOMS  °Symptoms usually start 3 to 6 days after you have been infected with the virus. Very bad chest pain is the main symptom of pleurodynia. The pain is usually felt on one side of the body, along the lower ribs. It starts suddenly and may last from a few seconds to 1 minute. It is hard to breathe when the pain strikes. You might feel pain again a few minutes or hours later. In most cases, the pain keeps coming back for 3 to 5 days. Then it goes away. In some cases, the pain keeps coming back every so often for up to 1 month. °Other symptoms of pleurodynia may include:  °· Fever. °· Rapid heartbeat. °· Sore throat. °· Cough. °· Headache. °· Stomach pain. °· Nausea. °· Vomiting. °· Diarrhea. °· Feeling very tired. °· Rash. °· For males, pain in the testicles. °DIAGNOSIS  °If you have had very bad chest pain, your caregiver will probably order some tests to determine whether you have pleurodynia. These tests may include: °· A throat swab. Your caregiver may rub the back of your throat with a cotton swab. The cotton swab can then be tested for coxsackievirus B. °· Urine and stool samples. These samples will be tested for coxsackievirus  B. °· Blood tests. These tests can tell if you have muscle damage. °· Chest X-rays. °· Electrocardiography (ECG). This test checks your heartbeat. °TREATMENT  °There is no treatment for an infection caused by coxsackievirus B. However, pleurodynia usually goes away on its own. It may take up to 1 month to fully recover. You may be given nonsteroidal anti-inflammatory drugs (NSAIDs) to control your pain. If your chest pain continues, you may need to see a pain specialist to discuss possibly using nerve block injections to relieve your pain. °HOME CARE INSTRUCTIONS °· Only take over-the-counter or prescription medicines for pain, fever, or discomfort as directed by your caregiver. °· Return to your regular activities slowly. °· Wash your hands often. This helps prevent coxsackievirus B from spreading. °· Do not smoke. °· Keep all follow-up appointments as directed by your caregiver. °SEEK MEDICAL CARE IF: °· You have new symptoms. °· Your symptoms are getting worse. °· You develop a cough. °· You have a sore throat. °· You have a rash. °· You have abdominal pain. °· You vomit. °· You have diarrhea. °SEEK IMMEDIATE MEDICAL CARE IF: °· You have very bad chest pain that is getting worse. °· You have trouble breathing. °· You have a fever. °MAKE SURE YOU: °· Understand these instructions. °· Will watch your condition. °· Will get help right away if you are not doing well or get worse. °Document Released: 08/24/2011 Document Revised: 11/27/2011 Document Reviewed: 08/24/2011 °[employer]® Patient Information ©2015 [employer]. This information is not   intended to replace advice given to you by your health care provider. Make sure you discuss any questions you have with your health care provider. ° °

## 2015-03-31 NOTE — ED Notes (Signed)
Pharmacy called for lovenox

## 2015-03-31 NOTE — ED Provider Notes (Signed)
[facility] Emergency Department Provider Note     Time seen: ----------------------------------------- 2:04 PM on 03/31/2015 -----------------------------------------    I have reviewed the triage vital signs and the nursing notes.   HISTORY  Chief Complaint Chest Pain    HPI CASILDA PICKERILL is a 39 y.o. female who presents ER for right-sided chest pain, worse with exhaling. Patient states started on Monday, has noted exertional dyspnea. Patient ordinarily exercises 5-6 days a week, has been on Coumadin for several years due to a PE secondary to protein C deficiency. She denies any fevers chills, any recent illness or infection. Patient denies recent stress or sleep deprivation. No other explanation for weakness. Pain is mild at this time, getting only worse with exhaling.   Past Medical History  Diagnosis Date  . Chronic kidney disease 2014    kidney stones  . Clotting disorder 1995    Protien C deficcency    Patient Active Problem List   Diagnosis Date Noted  . Acute upper respiratory infection 09/29/2014  . Encounter for therapeutic drug monitoring 10/30/2013  . Recurrent chest pain 07/21/2013  . Arrhythmia 07/21/2013  . 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 Hx pulmonary embolism 07/21/2013  . History of pulmonary embolism 07/21/2013  . 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 Breast cancer screening 07/21/2013    Past Surgical History  Procedure Laterality Date  . Tonsillectomy and adenoidectomy Bilateral 1995    Allergies Penicillins and Sulfa antibiotics  Social History History  Substance Use Topics  . Smoking status: Never Smoker   . Smokeless tobacco: Never Used  . Alcohol Use: No    Review of Systems Constitutional: Negative for fever. Eyes: Negative for visual changes. ENT: Negative for sore throat. Cardiovascular: Positive for right-sided pleuritic pain Respiratory: Positive for exertional shortness of  breath Gastrointestinal: Negative for abdominal pain, vomiting and diarrhea. Genitourinary: Negative for dysuria. Musculoskeletal: Negative for back pain. Skin: Negative for rash. Neurological: Negative for headaches, focal weakness or numbness.  10-point ROS otherwise negative.  ____________________________________________   PHYSICAL EXAM:  VITAL SIGNS: ED Triage Vitals  Enc Vitals Group     BP 03/31/15 1248 139/84 mmHg     Pulse Rate 03/31/15 1248 82     Resp 03/31/15 1248 18     Temp 03/31/15 1251 98.7 F (37.1 C)     Temp Source 03/31/15 1251 Oral     SpO2 03/31/15 1248 100 %     Weight 03/31/15 1248 112 lb (50.803 kg)     Height 03/31/15 1248  (1.499 m)     Head Cir --      Peak Flow --      Pain Score 03/31/15 1249 4     Pain Loc --      Pain Edu? --      Excl. in GC? --     Constitutional: Alert and oriented. Well appearing and in no distress. Eyes: Conjunctivae are normal. PERRL. Normal extraocular movements. ENT   Head: Normocephalic and atraumatic.   Nose: No congestion/rhinnorhea.   Mouth/Throat: Mucous membranes are moist.   Neck: No stridor. Hematological/Lymphatic/Immunilogical: No cervical lymphadenopathy. Cardiovascular: Normal rate, regular rhythm. Normal and symmetric distal pulses are present in all extremities. No murmurs, rubs, or gallops. Respiratory: Normal respiratory effort without tachypnea nor retractions. Breath sounds are clear and equal bilaterally. No wheezes/rales/rhonchi. Gastrointestinal: Soft and nontender. No distention. No abdominal bruits. There is no CVA tenderness. Musculoskeletal: Nontender with normal range of motion in all  extremities. No joint effusions.  No lower extremity tenderness nor edema. Neurologic:  Normal speech and language. No gross focal neurologic deficits are appreciated. Speech is normal. No gait instability. Skin:  Skin is warm, dry and intact. No rash noted. Psychiatric: Mood and affect are  normal. Speech and behavior are normal. Patient exhibits appropriate insight and judgment. ____________________________________________  EKG: Interpreted by me. Normal sinus rhythm with short PR, rate is 82 bpm, normal axis. No evidence of hypertrophy or acute infarction.  ____________________________________________  ED COURSE:  Pertinent labs & imaging results that were available during my care of the patient were reviewed by me and considered in my medical decision making (see chart for details). Patient a cardiac labs, imaging. ____________________________________________    LABS (pertinent positives/negatives)  Labs Reviewed  BASIC METABOLIC PANEL - Abnormal; Notable for the following:    Creatinine, Ser 1.07 (*)    Calcium 8.5 (*)    All other components within normal limits  PROTIME-INR - Abnormal; Notable for the following:    Prothrombin Time 17.1 (*)    All other components within normal limits  CBC  TROPONIN I    RADIOLOGY Images were viewed by me  CT angiogram  IMPRESSION: 1. No embolus or acute findings. 2. A chronic anterior mediastinal structure with fluid density element and calcific density element is slightly less prominent than back in 2012, but generally stable from more recent exams. Given the fluid density and calcific density, entities such as dermoid/epidermoid cyst (benign teratoma) are favored over residual thymic tissue, thymoma, or lymphoma. 3. Chronic scarring posterior basal segment right lower lobe. ____________________________________________  FINAL ASSESSMENT AND PLAN  Pleuritic pain  Plan: Patient with labs and imaging as dictated above. No clear etiology is identified for her symptoms. We'll give her shot of Lovenox here to bridge her until her Coumadin is more therapeutic and advised an extra dose of Coumadin today.   Emily FilbertWilliams, Jonathan E, MD   Emily FilbertJonathan E Williams, MD 03/31/15 [phone]

## 2015-03-31 NOTE — Telephone Encounter (Signed)
[facility] [place] [facility] TELEPHONE ADVICE RECORD [facility] Patient Name: Sabrina Woodard DOB: [birth year] Initial Comment Caller states she is having constant chest pain on right side. Nurse Assessment Nurse: Apolinar JunesBrandon, RN, Darl PikesSusan Date/Time Lamount Cohen(Eastern Time): 03/31/2015 10:59:32 AM Confirm and document reason for call. If symptomatic, describe symptoms. ---Caller states she is having constant chest pain on right side - started Monday but getting worse - she had costochondritis one time and it hurt like this but it was both sides not just one side like this - she can feel it worsen when she takes a deep breath - no cough or colds recently - she is on coumadin daily for blood clotting disorder - had blood clot in lung in 2013 and in her leg in 2011 Has the patient traveled out of the country within the last 30 days? ---Not Applicable Does the patient require triage? ---Yes Related visit to physician within the last 2 weeks? ---No Does the PT have any chronic conditions? (i.e. diabetes, asthma, etc.) ---Yes Did the patient indicate they were pregnant? ---No Guidelines Guideline Title Affirmed Question Affirmed Notes Chest Pain History of prior "blood clot" in leg or lungs (i.e., deep vein thrombosis, pulmonary embolism) Final Disposition User Go to ED Now Apolinar JunesBrandon, RN, Darl PikesSusan Referrals [facility] - ED Disagree/Comply: Comply

## 2015-03-31 NOTE — ED Notes (Signed)
Pt presents with chest pain right side, worse when exhaling. Pt states started on Monday. No acute distress noted.

## 2015-03-31 NOTE — Telephone Encounter (Signed)
Followed up with patient.  Confirms symptoms are still present and plans to go the ED.  Will continue to follow.

## 2015-04-01 ENCOUNTER — Other Ambulatory Visit: Payer: Self-pay | Admitting: Internal Medicine

## 2015-04-19 ENCOUNTER — Ambulatory Visit: Payer: Managed Care, Other (non HMO) | Admitting: Internal Medicine

## 2015-04-22 ENCOUNTER — Encounter: Payer: Self-pay | Admitting: Internal Medicine

## 2015-04-22 ENCOUNTER — Ambulatory Visit (INDEPENDENT_AMBULATORY_CARE_PROVIDER_SITE_OTHER): Payer: Managed Care, Other (non HMO) | Admitting: Internal Medicine

## 2015-04-22 VITALS — BP 114/76 | HR 82 | Temp 98.3°F | Resp 16 | Ht 61.0 in | Wt 114.5 lb

## 2015-04-22 DIAGNOSIS — Z7901 Long term (current) use of anticoagulants: Secondary | ICD-10-CM

## 2015-04-22 DIAGNOSIS — Z0000 Encounter for general adult medical examination without abnormal findings: Secondary | ICD-10-CM

## 2015-04-22 DIAGNOSIS — E329 Disease of thymus, unspecified: Secondary | ICD-10-CM

## 2015-04-22 DIAGNOSIS — Z86711 Personal history of pulmonary embolism: Secondary | ICD-10-CM

## 2015-04-22 DIAGNOSIS — Z1239 Encounter for other screening for malignant neoplasm of breast: Secondary | ICD-10-CM

## 2015-04-22 DIAGNOSIS — E875 Hyperkalemia: Secondary | ICD-10-CM

## 2015-04-22 NOTE — Progress Notes (Signed)
Subjective:  Patient ID: Sabrina Woodard, female    DOB: [birth year]  Age: 39 y.o. MRN: [id number]  CC: The primary encounter diagnosis was Long term current use of anticoagulant therapy. Diagnoses of Breast cancer screening, Encounter for preventive health examination, History of pulmonary embolism, and Disease of thymus gland were also pertinent to this visit.  HPI [name] presents for  ER follow up AND annua physicil,  But patientn has not been seen in 2 years.   Er evaluation was for chest pain rule out PE given history of PE due to protein C deficiency  And chronically subtherpauetic iNRs per review of last 6 months of INRs.   Last therapuetic INR was  On June 24th  , then subtherapeutic   INR in July during ER evaluation   CT angio was negative for PE but showed a chronic stable mediastinal mass that was seen in 2012  Previosu noninvasive workup for thymic mass by [facility] in 2012 .  And RLL scarring  History of pneumonia in 2012n  Current coumadin dose 7.5 mg daily 7 days per week     Since July 13th  History of chest tightness   And pleurisy  Without cold symptoms.  followed by joint aches without fevers, developed severe swelling of forehead and eyes while vacationing at the beach occurred on the day she did NOT wear a sun hat.  Took four days to resolve.   Last PAP smear by [facility] around 2012 no history of abnormals.   Needs annual mammograms last one 2012 and annual follow up   Was recommended   5 kids, has shome schooled the younger ones.  Oldest one turns 17 today   Outpatient Prescriptions Prior to Visit  Medication Sig Dispense Refill  . warfarin (COUMADIN) 1 MG tablet TAKE 1 TABLET (1 MG TOTAL) BY MOUTH DAILY. 30 tablet 5  . warfarin (COUMADIN) 5 MG tablet TAKE AS DIRECTED BY ANTICOAGULATION CLINIC 45 tablet 1  . warfarin (COUMADIN) 5 MG tablet TAKE AS DIRECTED BY ANTICOAGULATION CLINIC 45 tablet 1   No facility-administered medications prior to visit.    Review of  Systems;  Patient denies headache, fevers, malaise, unintentional weight loss, skin rash, eye pain, sinus congestion and sinus pain, sore throat, dysphagia,  hemoptysis , cough, dyspnea, wheezing, chest pain, palpitations, orthopnea, edema, abdominal pain, nausea, melena, diarrhea, constipation, flank pain, dysuria, hematuria, urinary  Frequency, nocturia, numbness, tingling, seizures,  Focal weakness, Loss of consciousness,  Tremor, insomnia, depression, anxiety, and suicidal ideation.      Objective:  BP 114/76 mmHg  Pulse 82  Temp(Src) 98.3 F (36.8 C) (Oral)  Resp 16  Ht  (1.549 m)  Wt 114 lb 8 oz (51.937 kg)  BMI 21.65 kg/m2  SpO2 99%  LMP 04/05/2015 (Approximate)  BP Readings from Last 3 Encounters:  04/22/15 114/76  03/31/15 104/74  09/25/14 118/82    Wt Readings from Last 3 Encounters:  04/22/15 114 lb 8 oz (51.937 kg)  03/31/15 112 lb (50.803 kg)  09/25/14 116 lb (52.617 kg)    General appearance: alert, cooperative and appears stated age Ears: normal TM's and external ear canals both ears Throat: lips, mucosa, and tongue normal; teeth and gums normal Neck: no adenopathy, no carotid bruit, supple, symmetrical, trachea midline and thyroid not enlarged, symmetric, no tenderness/mass/nodules Back: symmetric, no curvature. ROM normal. No CVA tenderness. Lungs: clear to auscultation bilaterally Heart: regular rate and rhythm, S1, S2 normal, no murmur, click, rub or  gallop Abdomen: soft, non-tender; bowel sounds normal; no masses,  no organomegaly Pulses: 2+ and symmetric Skin: Skin color, texture, turgor normal. No rashes or lesions Lymph nodes: Cervical, supraclavicular, and axillary nodes normal.  No results found for: HGBA1C  Lab Results  Component Value Date   CREATININE 1.13 04/22/2015   CREATININE 1.07* 03/31/2015   CREATININE 0.98 03/12/2015    Lab Results  Component Value Date   WBC 7.8 03/31/2015   HGB 15.1 03/31/2015   HCT 44.7 03/31/2015    PLT 152 03/31/2015   GLUCOSE 90 04/22/2015   CHOL 191 03/12/2015   TRIG 66.0 03/12/2015   HDL 53.10 03/12/2015   LDLCALC 125* 03/12/2015   ALT 15 03/12/2015   AST 25 03/12/2015   NA 140 04/22/2015   K 4.8 04/22/2015   CL 105 04/22/2015   CREATININE 1.13 04/22/2015   BUN 15 04/22/2015   CO2 27 04/22/2015   TSH 1.64 03/12/2015   INR 4.0* 04/22/2015    Dg Chest 2 View  03/31/2015   CLINICAL DATA:  Right upper chest pain, history of PE  EXAM: CHEST  2 VIEW  COMPARISON:  02/21/2013  FINDINGS: Cardiomediastinal silhouette is stable. No acute infiltrate or pleural effusion. No pulmonary edema. Stable compression deformities mid thoracic spine.  IMPRESSION: No active cardiopulmonary disease.   Electronically Signed   By: Natasha Mead M.D.   On: 03/31/2015 13:29   Ct Angio Chest Pe W/cm &/or Wo Cm  03/31/2015   CLINICAL DATA:  Right chest pain, pleuritic.  EXAM: CT ANGIOGRAPHY CHEST WITH CONTRAST  TECHNIQUE: Multidetector CT imaging of the chest was performed using the standard protocol during bolus administration of intravenous contrast. Multiplanar CT image reconstructions and MIPs were obtained to evaluate the vascular anatomy.  CONTRAST:  75mL OMNIPAQUE IOHEXOL 350 MG/ML SOLN  COMPARISON:  Multiple exams, including 02/21/2013 and 03/31/2015  FINDINGS: Mediastinum/Nodes: No filling defect is identified in the pulmonary arterial tree to suggest pulmonary embolus. No acute aortic findings identified.  The patient has a chronic low-density anterior mediastinal structure with primarily fluid density elements but also with curvilinear calcification inferiorly. This is been present back through 2012 and if anything has slightly reduced in size. It appears somewhat multilobular.  Lungs/Pleura: Biapical pleural parenchymal scarring. Chronic scarring in the posterior basal segment right lower lobe, similar to prior.  Upper abdomen: Unremarkable  Musculoskeletal: No significant findings.  Review of the MIP  images confirms the above findings.  IMPRESSION: 1. No embolus or acute findings. 2. A chronic anterior mediastinal structure with fluid density element and calcific density element is slightly less prominent than back in 2012, but generally stable from more recent exams. Given the fluid density and calcific density, entities such as dermoid/epidermoid cyst (benign teratoma) are favored over residual thymic tissue, thymoma, or lymphoma. 3. Chronic scarring posterior basal segment right lower lobe.   Electronically Signed   By: Gaylyn Rong M.D.   On: 03/31/2015 14:53    Assessment & Plan:   Problem List Items Addressed This Visit      Unprioritized   History of pulmonary embolism    Secondary to Protein C deficiency.  CT angiogram was done recently during ER evaluation of pleurisy and was negative.  She will need lifelong anticoagulation      Disease of thymus gland    Prior workup was done by Dr Thelma Barge. And benign. Repeat CT was reviewed.       Encounter for preventive health examination    Annual  wellness  exam was done as well as a comprehensive physical exam  .  During the course of the visit the patient was educated and counseled about appropriate screening and preventive services and screenings were brought up to date for cervical and breast cancer .  She will return for fasting labs to provide samples for diabetes screening and lipid analysis with projected  10 year  risk for CAD. nutrition counseling, skin cancer screening has been recommended, along with review of the age appropriate recommended immunizations.  Printed recommendations for health maintenance screenings was given.        Breast cancer screening   Relevant Orders   MM DIGITAL SCREENING BILATERAL    Other Visit Diagnoses    Long term current use of anticoagulant therapy    -  Primary    Relevant Orders    INR/PT (Completed)    CBC with Differential/Platelet    Basic metabolic panel       I am having Ms.  Denson maintain her warfarin, warfarin, and warfarin.  No orders of the defined types were placed in this encounter.    There are no discontinued medications.  Follow-up: Return in about 4 weeks (around 05/20/2015).   Sherlene Shams, MD

## 2015-04-22 NOTE — Progress Notes (Signed)
Pre-visit discussion using our clinic review tool. No additional management support is needed unless otherwise documented below in the visit note.  

## 2015-04-22 NOTE — Patient Instructions (Signed)

## 2015-04-23 LAB — BASIC METABOLIC PANEL
BUN: 15 mg/dL (ref 6–23)
CO2: 27 mEq/L (ref 19–32)
Calcium: 9.2 mg/dL (ref 8.4–10.5)
Chloride: 105 mEq/L (ref 96–112)
Creatinine, Ser: 1.13 mg/dL (ref 0.40–1.20)
GFR: 56.58 mL/min — AB (ref >60.00)
GLUCOSE: 90 mg/dL (ref 70–99)
Potassium: 4.8 mEq/L (ref 3.5–5.1)
Sodium: 140 mEq/L (ref 135–145)

## 2015-04-23 LAB — PROTIME-INR
INR: 4 ratio — ABNORMAL HIGH (ref 0.8–1.0)
PROTHROMBIN TIME: 43 s — AB (ref 9.6–13.1)

## 2015-04-25 DIAGNOSIS — Z0000 Encounter for general adult medical examination without abnormal findings: Secondary | ICD-10-CM | POA: Insufficient documentation

## 2015-04-25 NOTE — Assessment & Plan Note (Signed)
Prior workup was done by Dr Thelma Barge. And benign. Repeat CT was reviewed.

## 2015-04-25 NOTE — Assessment & Plan Note (Addendum)
Secondary to Protein C deficiency.  CT angiogram was done recently during ER evaluation of pleurisy and was negative.  She will need lifelong anticoagulation

## 2015-04-25 NOTE — Assessment & Plan Note (Signed)

## 2015-04-27 MED ORDER — WARFARIN SODIUM 1 MG PO TABS: 2.0000 mg | ORAL_TABLET | Freq: Every day | ORAL | Status: DC

## 2015-04-27 NOTE — Addendum Note (Signed)
Addended by: Sherlene Shams on: 04/27/2015 06:58 AM   Modules accepted: Orders

## 2015-04-29 ENCOUNTER — Telehealth: Payer: Self-pay | Admitting: *Deleted

## 2015-04-29 DIAGNOSIS — Z7901 Long term (current) use of anticoagulants: Secondary | ICD-10-CM

## 2015-04-29 NOTE — Telephone Encounter (Signed)
Pt was notified that we needed to repeat a CBC when she comes in for her PT/INR due to insufficient specimen.

## 2015-05-03 ENCOUNTER — Other Ambulatory Visit (INDEPENDENT_AMBULATORY_CARE_PROVIDER_SITE_OTHER): Payer: Managed Care, Other (non HMO)

## 2015-05-03 DIAGNOSIS — Z7901 Long term (current) use of anticoagulants: Secondary | ICD-10-CM

## 2015-05-03 LAB — PROTIME-INR
INR: 1.9 ratio — ABNORMAL HIGH (ref 0.8–1.0)
Prothrombin Time: 20.8 s — ABNORMAL HIGH (ref 9.6–13.1)

## 2015-05-19 ENCOUNTER — Other Ambulatory Visit: Payer: Managed Care, Other (non HMO)

## 2015-05-21 ENCOUNTER — Other Ambulatory Visit (INDEPENDENT_AMBULATORY_CARE_PROVIDER_SITE_OTHER): Payer: Managed Care, Other (non HMO)

## 2015-05-21 ENCOUNTER — Other Ambulatory Visit (HOSPITAL_COMMUNITY)
Admission: RE | Admit: 2015-05-21 | Discharge: 2015-05-21 | Disposition: A | Discharge status: Home / Self Care | Payer: Managed Care, Other (non HMO) | Source: Ambulatory Visit | Attending: Internal Medicine | Admitting: Internal Medicine

## 2015-05-21 ENCOUNTER — Ambulatory Visit (INDEPENDENT_AMBULATORY_CARE_PROVIDER_SITE_OTHER): Payer: Managed Care, Other (non HMO) | Admitting: Internal Medicine

## 2015-05-21 ENCOUNTER — Encounter: Payer: Self-pay | Admitting: Internal Medicine

## 2015-05-21 VITALS — BP 108/68 | HR 58 | Temp 98.4°F | Ht 61.0 in | Wt 116.0 lb

## 2015-05-21 DIAGNOSIS — Z7901 Long term (current) use of anticoagulants: Secondary | ICD-10-CM

## 2015-05-21 DIAGNOSIS — Z113 Encounter for screening for infections with a predominantly sexual mode of transmission: Secondary | ICD-10-CM | POA: Insufficient documentation

## 2015-05-21 DIAGNOSIS — Z01419 Encounter for gynecological examination (general) (routine) without abnormal findings: Secondary | ICD-10-CM | POA: Insufficient documentation

## 2015-05-21 DIAGNOSIS — Z5181 Encounter for therapeutic drug level monitoring: Secondary | ICD-10-CM | POA: Diagnosis not present

## 2015-05-21 DIAGNOSIS — Z1151 Encounter for screening for human papillomavirus (HPV): Secondary | ICD-10-CM | POA: Insufficient documentation

## 2015-05-21 DIAGNOSIS — Z124 Encounter for screening for malignant neoplasm of cervix: Secondary | ICD-10-CM | POA: Diagnosis not present

## 2015-05-21 DIAGNOSIS — N760 Acute vaginitis: Secondary | ICD-10-CM | POA: Diagnosis present

## 2015-05-21 LAB — PROTIME-INR
INR: 3.4 ratio — AB (ref 0.8–1.0)
PROTHROMBIN TIME: 36.8 s — AB (ref 9.6–13.1)

## 2015-05-21 NOTE — Patient Instructions (Signed)

## 2015-05-21 NOTE — Progress Notes (Signed)
Pre visit review using our clinic review tool, if applicable. No additional management support is needed unless otherwise documented below in the visit note. 

## 2015-05-23 ENCOUNTER — Encounter: Payer: Self-pay | Admitting: Internal Medicine

## 2015-05-23 DIAGNOSIS — Z124 Encounter for screening for malignant neoplasm of cervix: Secondary | ICD-10-CM | POA: Insufficient documentation

## 2015-05-23 NOTE — Assessment & Plan Note (Signed)
Coumadin level (INR) is high at 3.4 (goal is 2.0 to 3.0)   But dose was recently changed.  Will repeat in 2 weeks.  Lab Results  Component Value Date   INR 3.4* 05/21/2015   INR 1.9* 05/03/2015   INR 4.0* 04/22/2015

## 2015-05-23 NOTE — Assessment & Plan Note (Signed)
PAP smear was done.  Pelvic Exam was normal

## 2015-05-23 NOTE — Progress Notes (Signed)
Subjective:  Patient ID: Sabrina Woodard, female    DOB: 14-Jun-1976  Age: 39 y.o. MRN: [id number]  CC: The primary encounter diagnosis was Long term current use of anticoagulant therapy. Diagnoses of Screening for cervical cancer, Encounter for screening for cervical cancer , and Encounter for therapeutic drug monitoring were also pertinent to this visit.  HPI Sabrina Woodard presents for cervical ca screening and anticoagulation monitoring. She has had no excessive bleeding , and menses oare occurring regularly.   Outpatient Prescriptions Prior to Visit  Medication Sig Dispense Refill  . warfarin (COUMADIN) 1 MG tablet Take 2 tablets (2 mg total) by mouth daily at 6 PM. With 5 mg tablet 60 tablet 5  . warfarin (COUMADIN) 5 MG tablet TAKE AS DIRECTED BY ANTICOAGULATION CLINIC 45 tablet 1  . warfarin (COUMADIN) 5 MG tablet TAKE AS DIRECTED BY ANTICOAGULATION CLINIC 45 tablet 1   No facility-administered medications prior to visit.    Review of Systems;  Patient denies headache, fevers, malaise, unintentional weight loss, skin rash, eye pain, sinus congestion and sinus pain, sore throat, dysphagia,  hemoptysis , cough, dyspnea, wheezing, chest pain, palpitations, orthopnea, edema, abdominal pain, nausea, melena, diarrhea, constipation, flank pain, dysuria, hematuria, urinary  Frequency, nocturia, numbness, tingling, seizures,  Focal weakness, Loss of consciousness,  Tremor, insomnia, depression, anxiety, and suicidal ideation.      Objective:  BP 108/68 mmHg  Pulse 58  Temp(Src) 98.4 F (36.9 C) (Oral)  Ht 5\' 1"  (1.549 m)  Wt 116 lb (52.617 kg)  BMI 21.93 kg/m2  SpO2 97%  LMP 04/05/2015 (Approximate)  BP Readings from Last 3 Encounters:  05/21/15 108/68  04/22/15 114/76  03/31/15 104/74    Wt Readings from Last 3 Encounters:  05/21/15 116 lb (52.617 kg)  04/22/15 114 lb 8 oz (51.937 kg)  03/31/15 112 lb (50.803 kg)    Exam:  General Appearance:    Alert, cooperative, no  distress, appears stated age  Head:    Normocephalic, without obvious abnormality, atraumatic     Back:     Symmetric, no curvature, ROM normal, no CVA tenderness  Lungs:     Clear to auscultation bilaterally, respirations unlabored  Chest Wall:    No tenderness or deformity   Heart:    Regular rate and rhythm, S1 and S2 normal, no murmur, rub   or gallop     Genitalia:    Pelvic: cervix normal in appearance, external genitalia normal, no adnexal masses or tenderness, no cervical motion tenderness, rectovaginal septum normal, uterus normal size, shape, and consistency and vagina normal without discharge  Extremities:   Extremities normal, atraumatic, no cyanosis or edema  Pulses:   2+ and symmetric all extremities  Skin:   Skin color, texture, turgor normal, no rashes or lesions  Lymph nodes:   Cervical, supraclavicular, and axillary nodes normal  Neurologic:   CNII-XII intact, normal strength, sensation and reflexes    throughout   No results found for: HGBA1C  Lab Results  Component Value Date   CREATININE 1.13 04/22/2015   CREATININE 1.07* 03/31/2015   CREATININE 0.98 03/12/2015    Lab Results  Component Value Date   WBC 7.8 03/31/2015   HGB 15.1 03/31/2015   HCT 44.7 03/31/2015   PLT 152 03/31/2015   GLUCOSE 90 04/22/2015   CHOL 191 03/12/2015   TRIG 66.0 03/12/2015   HDL 53.10 03/12/2015   LDLCALC 125* 03/12/2015   ALT 15 03/12/2015   AST 25 03/12/2015  NA 140 04/22/2015   K 4.8 04/22/2015   CL 105 04/22/2015   CREATININE 1.13 04/22/2015   BUN 15 04/22/2015   CO2 27 04/22/2015   TSH 1.64 03/12/2015   INR 3.4* 05/21/2015    No results found.  Assessment & Plan:   Problem List Items Addressed This Visit      Unprioritized   Encounter for therapeutic drug monitoring    Coumadin level (INR) is high at 3.4 (goal is 2.0 to 3.0)   But dose was recently changed.  Will repeat in 2 weeks.  Lab Results  Component Value Date   INR 3.4* 05/21/2015   INR 1.9*  05/03/2015   INR 4.0* 04/22/2015         Encounter for screening for cervical cancer     PAP smear was done.  Pelvic Exam was normal       Other Visit Diagnoses    Long term current use of anticoagulant therapy    -  Primary    Relevant Orders    INR/PT (Completed)    Screening for cervical cancer        Relevant Orders    Cytology - PAP       I am having Ms. Steve maintain her warfarin, warfarin, and warfarin.  No orders of the defined types were placed in this encounter.    There are no discontinued medications.  Follow-up: No Follow-up on file.   Sherlene Shams, MD

## 2015-05-26 LAB — CYTOLOGY - PAP

## 2015-05-27 ENCOUNTER — Encounter: Payer: Self-pay | Admitting: *Deleted

## 2015-05-27 LAB — CERVICOVAGINAL ANCILLARY ONLY
Bacterial vaginitis: NEGATIVE
Candida vaginitis: NEGATIVE

## 2015-06-01 ENCOUNTER — Inpatient Hospital Stay
Admission: RE | Admit: 2015-06-01 | Discharge: 2015-06-01 | Disposition: A | Discharge status: Home / Self Care | Payer: Managed Care, Other (non HMO) | Source: Ambulatory Visit | Attending: Internal Medicine | Admitting: Internal Medicine

## 2015-06-01 DIAGNOSIS — Z1239 Encounter for other screening for malignant neoplasm of breast: Secondary | ICD-10-CM

## 2015-06-01 DIAGNOSIS — Z1231 Encounter for screening mammogram for malignant neoplasm of breast: Secondary | ICD-10-CM | POA: Diagnosis present

## 2015-06-02 LAB — CERVICOVAGINAL ANCILLARY ONLY: Herpes: NEGATIVE

## 2015-06-04 ENCOUNTER — Other Ambulatory Visit: Payer: Managed Care, Other (non HMO)

## 2015-06-11 ENCOUNTER — Other Ambulatory Visit (INDEPENDENT_AMBULATORY_CARE_PROVIDER_SITE_OTHER): Payer: Managed Care, Other (non HMO)

## 2015-06-11 ENCOUNTER — Encounter: Payer: Self-pay | Admitting: Internal Medicine

## 2015-06-11 DIAGNOSIS — Z7901 Long term (current) use of anticoagulants: Secondary | ICD-10-CM

## 2015-06-11 LAB — PROTIME-INR
INR: 4.3 ratio — AB (ref 0.8–1.0)
Prothrombin Time: 45.7 s — ABNORMAL HIGH (ref 9.6–13.1)

## 2015-08-03 ENCOUNTER — Other Ambulatory Visit: Payer: Self-pay | Admitting: Internal Medicine

## 2015-08-25 ENCOUNTER — Ambulatory Visit (INDEPENDENT_AMBULATORY_CARE_PROVIDER_SITE_OTHER): Payer: Managed Care, Other (non HMO) | Admitting: Family Medicine

## 2015-08-25 ENCOUNTER — Encounter: Payer: Self-pay | Admitting: Family Medicine

## 2015-08-25 VITALS — BP 106/64 | HR 70 | Temp 98.0°F | Ht 61.0 in | Wt 113.5 lb

## 2015-08-25 DIAGNOSIS — R21 Rash and other nonspecific skin eruption: Secondary | ICD-10-CM | POA: Insufficient documentation

## 2015-08-25 MED ORDER — PREDNISONE 10 MG PO TABS: ORAL_TABLET | ORAL | Status: DC

## 2015-08-25 NOTE — Progress Notes (Signed)
Pre visit review using our clinic review tool, if applicable. No additional management support is needed unless otherwise documented below in the visit note. 

## 2015-08-25 NOTE — Assessment & Plan Note (Signed)
New Problem. Rash appears to be contact/allergic in nature. Discussed treatment options today: Topical steroids versus systemic steroids. Patient elected for systemic steroids. Will treat with prednisone taper; See order.

## 2015-08-25 NOTE — Patient Instructions (Signed)
Take the prednisone as prescribed.  Benadryl for itching.  Call if you worsen or fail to improve.  Take care  Dr. Adriana Simasook

## 2015-08-25 NOTE — Progress Notes (Signed)
Subjective:  Patient ID: Sabrina Woodard, female    DOB: [birth year]  Age: 39 y.o. MRN: [id number]  CC: Rash  HPI:  39 year old female presents with complaints of rash.  Rash  Patient reports that she developed a bilateral medial ankle rash approximately 3 weeks ago.  She states that she thought was from her new boots.  She states that it is currently improving.  However, yesterday she developed the same type of rash around her neck and face.  Rash is intensely pruritic.  She denies any new exposures or contacts (other than the boots).  She's been using topical cortisone as well as lisinopril with no improvement.  No other associated symptoms: Fever, chills, mouth lesions.  Social Hx   Social History   Social History  . Marital Status: Married    Spouse Name: N/A  . Number of Children: N/A  . Years of Education: N/A   Social History Main Topics  . Smoking status: Never Smoker   . Smokeless tobacco: Never Used  . Alcohol Use: No  . Drug Use: No  . Sexual Activity: Yes    Birth Control/ Protection: Other-see comments     Comment: husband has had a vasectomy   Other Topics Concern  . None   Social History Narrative   Review of Systems  Constitutional: Negative for fever and chills.  Skin: Positive for rash.   Objective:  BP 106/64 mmHg  Pulse 70  Temp(Src) 98 F (36.7 C) (Oral)  Ht 5\' 1"  (1.549 m)  Wt 113 lb 8 oz (51.483 kg)  BMI 21.46 kg/m2  SpO2 99%  BP/Weight 08/25/2015 05/21/2015 04/22/2015  Systolic BP 106 108 114  Diastolic BP 64 68 76  Wt. (Lbs) 113.5 116 114.5  BMI 21.46 21.93 21.65   Physical Exam  Constitutional: She is oriented to person, place, and time. She appears well-developed. No distress.  HENT:  Head: Normocephalic and atraumatic.  Mouth/Throat: Oropharynx is clear and moist.  Cardiovascular: Normal rate and regular rhythm.   Pulmonary/Chest: Effort normal and breath sounds normal. No respiratory distress. She has no wheezes. She  has no rales.  Neurological: She is alert and oriented to person, place, and time.  Skin:  Medial ankles/medial lower legs - erythematous, papular rash noted.  Face/Neck - erythematous, papular rash noted. Excoriation marks noted. No crusting or drainage.  Psychiatric: She has a normal mood and affect.  Vitals reviewed.  Lab Results  Component Value Date   WBC 7.8 03/31/2015   HGB 15.1 03/31/2015   HCT 44.7 03/31/2015   PLT 152 03/31/2015   GLUCOSE 90 04/22/2015   CHOL 191 03/12/2015   TRIG 66.0 03/12/2015   HDL 53.10 03/12/2015   LDLCALC 125* 03/12/2015   ALT 15 03/12/2015   AST 25 03/12/2015   NA 140 04/22/2015   K 4.8 04/22/2015   CL 105 04/22/2015   CREATININE 1.13 04/22/2015   BUN 15 04/22/2015   CO2 27 04/22/2015   TSH 1.64 03/12/2015   INR 4.3* 06/11/2015    Assessment & Plan:   Problem List Items Addressed This Visit    Rash - Primary    New Problem. Rash appears to be contact/allergic in nature. Discussed treatment options today: Topical steroids versus systemic steroids. Patient elected for systemic steroids. Will treat with prednisone taper; See order.         Meds ordered this encounter  Medications  . predniSONE (DELTASONE) 10 MG tablet    Sig: 40 mg (4 tablets)  daily x 3 days, then 30 mg (3 tablets) daily x 3 days, then 20 mg (2 tablets) daily x 3 days, then 10 mg (1 tablet) daily x 3 days.    Dispense:  30 tablet    Refill:  0    Follow-up: Return if symptoms worsen or fail to improve.  Everlene Other DO [facility]

## 2015-10-22 ENCOUNTER — Other Ambulatory Visit: Payer: Self-pay

## 2015-10-22 MED ORDER — WARFARIN SODIUM 5 MG PO TABS: ORAL_TABLET | ORAL | Status: DC

## 2015-10-22 NOTE — Telephone Encounter (Signed)
Refill for 30 days only. inr needed prior to any more refills

## 2015-10-22 NOTE — Telephone Encounter (Signed)
[facility] pharmacy call to get a refill on Sabrina Woodard coumadin. Pt's last OV with you 05/21/15, pt did have a appt with Dr Adriana Simas 08/25/15 last INR 06/11/15 was abnormal, last filled 08/03/15 #45tabs with 1refill. Please advise

## 2015-10-25 NOTE — Telephone Encounter (Signed)
Pt scheduled  

## 2015-10-25 NOTE — Telephone Encounter (Signed)
LM with mother for her to call and make an appt to get her blood drawn

## 2015-10-29 ENCOUNTER — Encounter: Payer: Self-pay | Admitting: Internal Medicine

## 2015-10-29 ENCOUNTER — Other Ambulatory Visit (INDEPENDENT_AMBULATORY_CARE_PROVIDER_SITE_OTHER): Payer: Managed Care, Other (non HMO)

## 2015-10-29 DIAGNOSIS — Z7901 Long term (current) use of anticoagulants: Secondary | ICD-10-CM | POA: Diagnosis not present

## 2015-10-29 DIAGNOSIS — Z5181 Encounter for therapeutic drug level monitoring: Secondary | ICD-10-CM

## 2015-10-29 LAB — PROTIME-INR
INR: 1.6 ratio — AB (ref 0.8–1.0)
Prothrombin Time: 16.8 s — ABNORMAL HIGH (ref 9.6–13.1)

## 2015-11-04 NOTE — Telephone Encounter (Signed)
Increase dose to 10 mg tonight,   And going forward 6 mg daily starting on Friday   Lab Results  Component Value Date   INR 1.6* 10/29/2015   INR 4.3* 06/11/2015   INR 3.4* 05/21/2015

## 2015-11-04 NOTE — Telephone Encounter (Signed)
Pt called and stated that she was currently locked out of her mychart account. I notified the pt of her test results, pt verbalized understanding. Pt states that she is currently taking  daily, and she has not missed any doses. Please advise, thanks

## 2015-11-04 NOTE — Telephone Encounter (Signed)
Left message for patient to call office and verify message received for the repeat of new regimen.

## 2015-11-15 NOTE — Telephone Encounter (Signed)
Left numerous messages for patien to retrurn call concerning PT-INR .

## 2015-11-16 NOTE — Telephone Encounter (Signed)
Mailed unread message to patient.  

## 2015-11-16 NOTE — Telephone Encounter (Signed)
Patient was notified on 11/04/15 of results and new regimen but no appointment for re-check of PT-INR given is 11/26/15 ok?

## 2015-11-25 ENCOUNTER — Other Ambulatory Visit: Payer: Self-pay | Admitting: Internal Medicine

## 2015-11-25 ENCOUNTER — Other Ambulatory Visit (INDEPENDENT_AMBULATORY_CARE_PROVIDER_SITE_OTHER): Payer: Managed Care, Other (non HMO)

## 2015-11-25 DIAGNOSIS — Z5181 Encounter for therapeutic drug level monitoring: Secondary | ICD-10-CM

## 2015-11-25 LAB — PROTIME-INR
INR: 1.9 ratio — AB (ref 0.8–1.0)
PROTHROMBIN TIME: 20 s — AB (ref 9.6–13.1)

## 2015-11-25 MED ORDER — WARFARIN SODIUM 2 MG PO TABS: ORAL_TABLET | ORAL | Status: DC

## 2015-11-25 MED ORDER — WARFARIN SODIUM 5 MG PO TABS: ORAL_TABLET | ORAL | Status: DC

## 2015-12-21 ENCOUNTER — Other Ambulatory Visit: Payer: Self-pay | Admitting: Internal Medicine

## 2015-12-21 DIAGNOSIS — Z7901 Long term (current) use of anticoagulants: Secondary | ICD-10-CM

## 2015-12-21 NOTE — Telephone Encounter (Signed)
Scheduled patient for PT-INR on 12/24/15 ok to refill 5 mg coumadin ?

## 2015-12-21 NOTE — Telephone Encounter (Signed)
Ok to refill. But I would like to make an appt to discuss alternative  forms of anticoagulation

## 2015-12-24 ENCOUNTER — Other Ambulatory Visit (INDEPENDENT_AMBULATORY_CARE_PROVIDER_SITE_OTHER): Payer: Managed Care, Other (non HMO)

## 2015-12-24 DIAGNOSIS — Z7901 Long term (current) use of anticoagulants: Secondary | ICD-10-CM | POA: Diagnosis not present

## 2015-12-24 LAB — PROTIME-INR
INR: 2.8 ratio — ABNORMAL HIGH (ref 0.8–1.0)
PROTHROMBIN TIME: 30.2 s — AB (ref 9.6–13.1)

## 2016-01-05 ENCOUNTER — Telehealth: Payer: Self-pay | Admitting: Internal Medicine

## 2016-01-05 ENCOUNTER — Encounter: Payer: Self-pay | Admitting: *Deleted

## 2016-01-05 NOTE — Telephone Encounter (Signed)
Spoke with the patient reviewed labs with her.  Verbalized understanding.

## 2016-01-05 NOTE — Telephone Encounter (Signed)
Pt is calling to get her lab results. Please call cell phone.

## 2016-02-18 ENCOUNTER — Other Ambulatory Visit (INDEPENDENT_AMBULATORY_CARE_PROVIDER_SITE_OTHER): Payer: Managed Care, Other (non HMO)

## 2016-02-18 DIAGNOSIS — Z7901 Long term (current) use of anticoagulants: Secondary | ICD-10-CM | POA: Diagnosis not present

## 2016-02-18 LAB — PROTIME-INR
INR: 3.5 ratio — AB (ref 0.8–1.0)
Prothrombin Time: 37.8 s — ABNORMAL HIGH (ref 9.6–13.1)

## 2016-05-04 ENCOUNTER — Other Ambulatory Visit: Payer: Self-pay

## 2016-05-04 DIAGNOSIS — Z7901 Long term (current) use of anticoagulants: Secondary | ICD-10-CM

## 2016-05-05 ENCOUNTER — Other Ambulatory Visit (INDEPENDENT_AMBULATORY_CARE_PROVIDER_SITE_OTHER): Payer: Managed Care, Other (non HMO)

## 2016-05-05 DIAGNOSIS — Z7901 Long term (current) use of anticoagulants: Secondary | ICD-10-CM

## 2016-05-05 LAB — PROTIME-INR
INR: 2.1 ratio — ABNORMAL HIGH (ref 0.8–1.0)
PROTHROMBIN TIME: 22.6 s — AB (ref 9.6–13.1)

## 2016-05-09 ENCOUNTER — Telehealth: Payer: Self-pay | Admitting: *Deleted

## 2016-05-09 NOTE — Telephone Encounter (Signed)
Refill request for, last seen, last filled.  Please advise. 

## 2016-05-09 NOTE — Telephone Encounter (Signed)
Patient was informed of results.  Patient understood and no questions, comments, or concerns at this time.  

## 2016-05-09 NOTE — Telephone Encounter (Signed)
Patient has requested INR results  Pt contact [phone]

## 2016-07-07 ENCOUNTER — Telehealth: Payer: Self-pay | Admitting: *Deleted

## 2016-07-07 NOTE — Telephone Encounter (Signed)
Per pt- she will go to a walk in  *no acute appt available

## 2016-07-07 NOTE — Telephone Encounter (Signed)
Please triage patient.

## 2016-07-07 NOTE — Telephone Encounter (Signed)
Spoken to patient. A month ago patient choked real bad a developed a cough.  Now she has been congested for two weeks. Sinus pain/pressure, drainage, coughing up phlegm, phlegm has been light green form nose and mouth.  Most of the congestion is in her sinuses.  Patient would like an antibiotic for sx.  spokemn to patient and she will be scheduling appointment with margaret on Monday for SX.

## 2016-07-07 NOTE — Telephone Encounter (Signed)
Pt requested to have a antibiotic called into the pharmacy,per pt- she currently has symptoms of a sinus infection. Greenish/yellow mucus, cough from drainage. She stated it started off with allergies and progressed with time. Pt contact [phone]

## 2016-07-10 ENCOUNTER — Encounter: Payer: Self-pay | Admitting: Family

## 2016-07-10 ENCOUNTER — Ambulatory Visit (INDEPENDENT_AMBULATORY_CARE_PROVIDER_SITE_OTHER): Payer: Managed Care, Other (non HMO) | Admitting: Family

## 2016-07-10 VITALS — BP 130/76 | HR 85 | Temp 98.0°F | Ht 59.0 in | Wt 113.6 lb

## 2016-07-10 DIAGNOSIS — J209 Acute bronchitis, unspecified: Secondary | ICD-10-CM | POA: Diagnosis not present

## 2016-07-10 DIAGNOSIS — Z7901 Long term (current) use of anticoagulants: Secondary | ICD-10-CM

## 2016-07-10 LAB — PROTIME-INR
INR: 3.6 ratio — ABNORMAL HIGH (ref 0.8–1.0)
PROTHROMBIN TIME: 39.4 s — AB (ref 9.6–13.1)

## 2016-07-10 MED ORDER — AZITHROMYCIN 250 MG PO TABS: ORAL_TABLET | ORAL | 0 refills | Status: DC

## 2016-07-10 NOTE — Patient Instructions (Addendum)
  INR today and on Wednesday.   Increase intake of clear fluids. Congestion is best treated by hydration, when mucus is wetter, it is thinner, less sticky, and easier to expel from the body, either through coughing up drainage, or by blowing your nose.   Get plenty of rest.   Use saline nasal drops and blow your nose frequently. Run a humidifier at night and elevate the head of the bed. Vicks Vapor rub will help with congestion and cough. Steam showers and sinus massage for congestion.   Use Acetaminophen or Ibuprofen as needed for fever or pain. Avoid second hand smoke. Even the smallest exposure will worsen symptoms.   Over the counter medications you can try include Delsym for cough, a decongestant for congestion, and Mucinex or Robitussin as an expectorant. Be sure to just get the plain Mucinex or Robitussin that just has one medication (Guaifenesen). We don't recommend the combination products. Note, be sure to drink two glasses of water with each dose of Mucinex as the medication will not work well without adequate hydration.   You can also try a teaspoon of honey to see if this will help reduce cough. Throat lozenges can sometimes be beneficial as well.    This illness will typically last 7 - 10 days.   Please follow up with our clinic if you develop a fever greater than 101 F, symptoms worsen, or do not resolve in the next week.

## 2016-07-10 NOTE — Progress Notes (Signed)
Subjective:    Patient ID: Sabrina Woodard, female    DOB: 02-16-1976, 40 y.o.   MRN: [id number]  CC: Sabrina Woodard is a 40 y.o. female who presents today for an acute visit.    HPI: Acute visit for sinus congestion for 3 weeks, waxing and waning. Has felt SOB when exercising. Endorses productive  cough at night, pressure ears, sore throat. Tried Aleve D, Sudafed, Mucinex with minimal relief.  No fever, chills, body aches.  No h/o lung disease. H/o clotting disorder, on coumadin. Notes 'pink tinge' to mucous when blows nose, intermittent, over last 3 weeks. No other bleeding.     HISTORY:  Past Medical History:  Diagnosis Date  . Chronic kidney disease 2014   kidney stones  . Clotting disorder (HCC) 1995   Protien C deficcency   Past Surgical History:  Procedure Laterality Date  . TONSILLECTOMY AND ADENOIDECTOMY Bilateral 1995   Family History  Problem Relation Age of Onset  . Hyperlipidemia Mother   . Hypertension Mother   . Cancer Father [id number]    prostate   . Hyperlipidemia Father   . Hypertension Father   . Stroke Maternal Grandfather   . Cancer Paternal Grandmother [id number]    breast ca   . COPD Paternal Grandfather     Allergies: Penicillins and Sulfa antibiotics Current Outpatient Prescriptions on File Prior to Visit  Medication Sig Dispense Refill  . warfarin (COUMADIN) 2 MG tablet Take 1 tablet daily along with 5 mg tablet for total  daily dose 7 mg 60 tablet 11  . warfarin (COUMADIN) 5 MG tablet ONE TABLET DAILY 90 tablet 1  . warfarin (COUMADIN) 5 MG tablet TAKE ONE TABLET BY MOUTH DAILY AS DIRECTED. OFFICE VISIT NEEDED PRIOR TO ANY MORE REFILLS 45 tablet 0   No current facility-administered medications on file prior to visit.     Social History  Substance Use Topics  . Smoking status: Never Smoker  . Smokeless tobacco: Never Used  . Alcohol use No    Review of Systems  Constitutional: Negative for chills and fever.  HENT: Positive for congestion, ear pain  (bilateral) and sore throat. Negative for sinus pressure.   Respiratory: Positive for cough and shortness of breath. Negative for wheezing.   Cardiovascular: Negative for chest pain and palpitations.  Gastrointestinal: Negative for nausea and vomiting.  Musculoskeletal: Negative for myalgias.  Neurological: Positive for headaches (pressure on left of head; resolved with sudafed).      Objective:    BP 130/76   Pulse 85   Temp 98 F (36.7 C) (Oral)   Ht 4\' 11"  (1.499 m)   Wt 113 lb 9.6 oz (51.5 kg)   SpO2 99%   BMI 22.94 kg/m    Physical Exam  Constitutional: She appears well-developed and well-nourished.  HENT:  Head: Normocephalic and atraumatic.  Right Ear: Hearing, tympanic membrane, external ear and ear canal normal. No drainage, swelling or tenderness. No foreign bodies. Tympanic membrane is not erythematous and not bulging. No middle ear effusion. No decreased hearing is noted.  Left Ear: Hearing, tympanic membrane, external ear and ear canal normal. No drainage, swelling or tenderness. No foreign bodies. Tympanic membrane is not erythematous and not bulging.  No middle ear effusion. No decreased hearing is noted.  Nose: Rhinorrhea present. Right sinus exhibits no maxillary sinus tenderness and no frontal sinus tenderness. Left sinus exhibits no maxillary sinus tenderness and no frontal sinus tenderness.  Mouth/Throat: Uvula is midline, oropharynx is clear  and moist and mucous membranes are normal. No oropharyngeal exudate, posterior oropharyngeal edema, posterior oropharyngeal erythema or tonsillar abscesses.  Eyes: Conjunctivae are normal.  Cardiovascular: Regular rhythm, normal heart sounds and normal pulses.   Pulmonary/Chest: Effort normal and breath sounds normal. She has no wheezes. She has no rhonchi. She has no rales.  Lymphadenopathy:       Head (right side): No submental, no submandibular, no tonsillar, no preauricular, no posterior auricular and no occipital  adenopathy present.       Head (left side): No submental, no submandibular, no tonsillar, no preauricular, no posterior auricular and no occipital adenopathy present.    She has no cervical adenopathy.  Neurological: She is alert.  Skin: Skin is warm and dry.  Psychiatric: She has a normal mood and affect. Her speech is normal and behavior is normal. Thought content normal.  Vitals reviewed.      Assessment & Plan:  1. Acute bronchitis, unspecified organism Afebrile. Sa02 99. Based on duration of symptoms, patient I jointly agreed on antibiotic therapy. Patient politely declined albuterol inhaler for occasional shortness of breath with exercise. Return precautions given. - azithromycin (ZITHROMAX) 250 MG tablet; Tale 500 mg PO on day 1, then 250 mg PO q24h x 4 days.  Dispense: 6 tablet; Refill: 0  2. Long term current use of anticoagulant therapy Advised patient to stay vigilant for any increased bleeding. Education provided on drug interaction with antibiotics and Coumadin. Pending INR today and on Wednesday.  - INR/PT - INR/PT     I have discontinued Ms. Mccormac's predniSONE. I am also having her start on azithromycin. Additionally, I am having her maintain her warfarin, warfarin, and warfarin.   Meds ordered this encounter  Medications  . azithromycin (ZITHROMAX) 250 MG tablet    Sig: Tale 500 mg PO on day 1, then 250 mg PO q24h x 4 days.    Dispense:  6 tablet    Refill:  0    Order Specific Question:   Supervising Provider    Answer:   Sherlene Shams [[id number]]    Return precautions given.   Risks, benefits, and alternatives of the medications and treatment plan prescribed today were discussed, and patient expressed understanding.   Education regarding symptom management and diagnosis given to patient on AVS.  Continue to follow with TULLO, Mar Daring, MD for routine health maintenance.   Sabrina Seton and I agreed with plan.   Rennie Plowman, FNP

## 2016-07-10 NOTE — Progress Notes (Signed)
Pre visit review using our clinic review tool, if applicable. No additional management support is needed unless otherwise documented below in the visit note. 

## 2016-07-11 ENCOUNTER — Other Ambulatory Visit: Payer: Self-pay

## 2016-07-11 DIAGNOSIS — Z7901 Long term (current) use of anticoagulants: Principal | ICD-10-CM

## 2016-07-11 DIAGNOSIS — Z5181 Encounter for therapeutic drug level monitoring: Secondary | ICD-10-CM

## 2016-07-11 NOTE — Telephone Encounter (Signed)
Mondays and Fridays take 5mg  coumadin Other days takes 7 mg coumadin.  Advised to suspend dose today ( 7mg ) and coming back Weds to recheck INR. Patient will stay vigilant with any signs of bleeding.

## 2016-07-12 ENCOUNTER — Other Ambulatory Visit (INDEPENDENT_AMBULATORY_CARE_PROVIDER_SITE_OTHER): Payer: Managed Care, Other (non HMO)

## 2016-07-12 DIAGNOSIS — Z7901 Long term (current) use of anticoagulants: Secondary | ICD-10-CM

## 2016-07-12 DIAGNOSIS — Z5181 Encounter for therapeutic drug level monitoring: Secondary | ICD-10-CM | POA: Diagnosis not present

## 2016-07-12 LAB — PROTIME-INR
INR: 2.1 ratio — ABNORMAL HIGH (ref 0.8–1.0)
Prothrombin Time: 22.1 s — ABNORMAL HIGH (ref 9.6–13.1)

## 2016-07-14 ENCOUNTER — Telehealth: Payer: Self-pay | Admitting: Family

## 2016-07-14 DIAGNOSIS — Z7901 Long term (current) use of anticoagulants: Secondary | ICD-10-CM

## 2016-07-14 NOTE — Telephone Encounter (Signed)
Thank you!!  TT

## 2016-07-14 NOTE — Telephone Encounter (Signed)
Patient has been informed. Patient will be coming in at 0900 on 30oct2017

## 2016-07-14 NOTE — Telephone Encounter (Signed)
FYI Dr Darrick Huntsmanullo-   Patient started zpack on 10/23 for bronchitis , and we checked her INR that same day which was 3.6. We suspended her coumadin dose for one day.   10/25   INR 2.1 which is in goal ( 2-3).   She completes z pack today.   We will repeat next week after she has completed antibiotic to ensure she remains therapeutic.   Mal AmabileBrock- would you call patient and advise her to come in for INR on Monday? Order pended.

## 2016-07-17 ENCOUNTER — Other Ambulatory Visit (INDEPENDENT_AMBULATORY_CARE_PROVIDER_SITE_OTHER): Payer: Managed Care, Other (non HMO)

## 2016-07-17 DIAGNOSIS — Z7901 Long term (current) use of anticoagulants: Secondary | ICD-10-CM | POA: Diagnosis not present

## 2016-07-17 DIAGNOSIS — Z5181 Encounter for therapeutic drug level monitoring: Secondary | ICD-10-CM

## 2016-07-17 LAB — PROTIME-INR
INR: 1.9 ratio — AB (ref 0.8–1.0)
Prothrombin Time: 20.1 s — ABNORMAL HIGH (ref 9.6–13.1)

## 2016-11-30 ENCOUNTER — Other Ambulatory Visit: Payer: Self-pay | Admitting: Internal Medicine

## 2016-11-30 NOTE — Telephone Encounter (Signed)
Refilled on 12/21/2015 Last OV: 07/10/2016 Next OV: not scheduled.  Last INR: 07/17/2016

## 2016-12-11 ENCOUNTER — Other Ambulatory Visit: Payer: Self-pay | Admitting: Internal Medicine

## 2016-12-22 ENCOUNTER — Ambulatory Visit (INDEPENDENT_AMBULATORY_CARE_PROVIDER_SITE_OTHER): Payer: Managed Care, Other (non HMO) | Admitting: Internal Medicine

## 2016-12-22 ENCOUNTER — Encounter: Payer: Self-pay | Admitting: Internal Medicine

## 2016-12-22 VITALS — BP 112/76 | HR 72 | Temp 98.5°F | Resp 16 | Ht 59.0 in | Wt 116.0 lb

## 2016-12-22 DIAGNOSIS — E329 Disease of thymus, unspecified: Secondary | ICD-10-CM | POA: Diagnosis not present

## 2016-12-22 DIAGNOSIS — Z0000 Encounter for general adult medical examination without abnormal findings: Secondary | ICD-10-CM | POA: Diagnosis not present

## 2016-12-22 DIAGNOSIS — Z79899 Other long term (current) drug therapy: Secondary | ICD-10-CM | POA: Diagnosis not present

## 2016-12-22 DIAGNOSIS — Z124 Encounter for screening for malignant neoplasm of cervix: Secondary | ICD-10-CM

## 2016-12-22 DIAGNOSIS — M8588 Other specified disorders of bone density and structure, other site: Secondary | ICD-10-CM | POA: Diagnosis not present

## 2016-12-22 DIAGNOSIS — E7800 Pure hypercholesterolemia, unspecified: Secondary | ICD-10-CM

## 2016-12-22 DIAGNOSIS — Z5181 Encounter for therapeutic drug level monitoring: Secondary | ICD-10-CM

## 2016-12-22 DIAGNOSIS — Z1231 Encounter for screening mammogram for malignant neoplasm of breast: Secondary | ICD-10-CM

## 2016-12-22 DIAGNOSIS — R5383 Other fatigue: Secondary | ICD-10-CM | POA: Diagnosis not present

## 2016-12-22 DIAGNOSIS — D6859 Other primary thrombophilia: Secondary | ICD-10-CM

## 2016-12-22 DIAGNOSIS — Z1239 Encounter for other screening for malignant neoplasm of breast: Secondary | ICD-10-CM

## 2016-12-22 LAB — COMPREHENSIVE METABOLIC PANEL
ALBUMIN: 4 g/dL (ref 3.5–5.2)
ALT: 16 U/L (ref 0–35)
AST: 24 U/L (ref 0–37)
Alkaline Phosphatase: 52 U/L (ref 39–117)
BILIRUBIN TOTAL: 0.4 mg/dL (ref 0.2–1.2)
BUN: 11 mg/dL (ref 6–23)
CALCIUM: 8.9 mg/dL (ref 8.4–10.5)
CHLORIDE: 107 meq/L (ref 96–112)
CO2: 28 meq/L (ref 19–32)
CREATININE: 0.86 mg/dL (ref 0.40–1.20)
GFR: 76.9 mL/min (ref >60.00)
Glucose, Bld: 89 mg/dL (ref 70–99)
Potassium: 4.2 mEq/L (ref 3.5–5.1)
Sodium: 141 mEq/L (ref 135–145)
Total Protein: 6 g/dL (ref 6.0–8.3)

## 2016-12-22 LAB — CBC WITH DIFFERENTIAL/PLATELET
Basophils Absolute: 0 10*3/uL (ref 0.0–0.1)
Basophils Relative: 1 % (ref 0.0–3.0)
EOS ABS: 0.1 10*3/uL (ref 0.0–0.7)
Eosinophils Relative: 2.5 % (ref 0.0–5.0)
HEMATOCRIT: 43 % (ref 36.0–46.0)
HEMOGLOBIN: 14.6 g/dL (ref 12.0–15.0)
Lymphocytes Relative: 31.6 % (ref 12.0–46.0)
Lymphs Abs: 1.4 10*3/uL (ref 0.7–4.0)
MCHC: 34 g/dL (ref 30.0–36.0)
MCV: 90.5 fl (ref 78.0–100.0)
MONOS PCT: 8.5 % (ref 3.0–12.0)
Monocytes Absolute: 0.4 10*3/uL (ref 0.1–1.0)
NEUTROS ABS: 2.4 10*3/uL (ref 1.4–7.7)
Neutrophils Relative %: 56.4 % (ref 43.0–77.0)
Platelets: 162 10*3/uL (ref 150.0–400.0)
RBC: 4.75 Mil/uL (ref 3.87–5.11)
RDW: 13.1 % (ref 11.5–15.5)
WBC: 4.3 10*3/uL (ref 4.0–10.5)

## 2016-12-22 LAB — LIPID PANEL
CHOL/HDL RATIO: 3
CHOLESTEROL: 187 mg/dL (ref 0–200)
HDL: 53.4 mg/dL (ref >39.00)
LDL CALC: 118 mg/dL — AB (ref 0–99)
NonHDL: 133.24
Triglycerides: 76 mg/dL (ref 0.0–149.0)
VLDL: 15.2 mg/dL (ref 0.0–40.0)

## 2016-12-22 LAB — PROTIME-INR
INR: 1.5 ratio — ABNORMAL HIGH (ref 0.8–1.0)
Prothrombin Time: 16.1 s — ABNORMAL HIGH (ref 9.6–13.1)

## 2016-12-22 LAB — TSH: TSH: 1.36 u[IU]/mL (ref 0.35–4.50)

## 2016-12-22 MED ORDER — WARFARIN SODIUM 5 MG PO TABS: 5.0000 mg | ORAL_TABLET | Freq: Every day | ORAL | 1 refills | Status: DC

## 2016-12-22 NOTE — Progress Notes (Signed)
Patient ID: Sabrina Woodard, female    DOB: [birth year]  Age: 41 y.o. MRN: [id number]  The patient is here for annua examination and management of other chronic and acute problems.   The risk factors are reflected in the social history.  The roster of all physicians providing medical care to patient - is listed in the Snapshot section of the chart.  Home safety : The patient has smoke detectors in the home. They wear seatbelts.  There are no firearms at home. There is no violence in the home.   There is no risks for hepatitis, STDs or HIV. There is no   history of blood transfusion. They have no travel history to infectious disease endemic areas of the world.  The patient has seen their dentist in the last six month. They have seen their eye doctor in the last year.   They do not  have excessive sun exposure. Discussed the need for sun protection: hats, long sleeves and use of sunscreen if there is significant sun exposure.   Diet: the importance of a healthy diet is discussed. They do have a healthy diet.  The benefits of regular aerobic exercise were discussed. She walks 4 times per week ,  20 minutes.   Depression screen: there are no signs or vegative symptoms of depression- irritability, change in appetite, anhedonia, sadness/tearfullness. The following portions of the patient's history were reviewed and updated as appropriate: allergies, current medications, past family history, past medical history,  past surgical history, past social history  and problem list.  Visual acuity was not assessed per patient preference since she has regular follow up with her ophthalmologist. Hearing and body mass index were assessed and reviewed.   During the course of the visit the patient was educated and counseled about appropriate screening and preventive services including : fall prevention , diabetes screening, nutrition counseling, colorectal cancer screening, and recommended immunizations.    CC:  The primary encounter diagnosis was Encounter for therapeutic drug monitoring. Diagnoses of Long-term use of high-risk medication, Pure hypercholesterolemia, Other fatigue, Protein C deficiency (HCC), Breast cancer screening, Disease of thymus gland (HCC), Osteopenia of spine, Encounter for screening for cervical cancer , and Encounter for preventive health examination were also pertinent to this visit.  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   She was last seen in October, and her last INr was in October.  Her INR was low and she was advised to increase her warfarin dose to 7 mg daily (49 mg weekly) via detailed phone message but has not done so and is still taking a total of 45 mg weekly).  Daily dose 7  Mg  5 days per week and 5 mg two days per week.  She denies any signs of symptoms of recurrent DV/PE or of excessive bleeding.   Father is having currently being worked up  By neurology  for Parkinson's and lewy body dementia.  He is In mid 60's.  There is no family history .  History Sabrina Woodard has a past medical history of Chronic kidney disease (2014) and Clotting disorder (HCC) (1995).   She has a past surgical history that includes Tonsillectomy and adenoidectomy (Bilateral, 1995).   Her family history includes COPD in her paternal grandfather; Cancer (age of onset: 35) in her paternal grandmother; Cancer (age of onset: 39) in her father; Hyperlipidemia in her father and mother; Hypertension in her father and mother;  Stroke in her maternal grandfather.She reports that she has never smoked. She has never used smokeless tobacco. She reports that she does not drink alcohol or use drugs.  Outpatient Medications Prior to Visit  Medication Sig Dispense Refill  . warfarin (COUMADIN) 2 MG tablet TAKE 1 TABLET DAILY ALONG WITH 5 MG TABLET FOR TOTAL DAILY DOSE 7 MG 60 tablet 10  . warfarin (COUMADIN) 5 MG tablet ONE  TABLET DAILY 90 tablet 1  . warfarin (COUMADIN) 5 MG tablet TAKE ONE TABLET BY MOUTH DAILY AS DIRECTED. OFFICE VISIT NEEDED PRIOR TO ANY MORE REFILLS 45 tablet 0  . azithromycin (ZITHROMAX) 250 MG tablet Tale 500 mg PO on day 1, then 250 mg PO q24h x 4 days. (Patient not taking: Reported on 12/22/2016) 6 tablet 0   No facility-administered medications prior to visit.     Review of Systems   Patient denies headache, fevers, malaise, unintentional weight loss, skin rash, eye pain, sinus congestion and sinus pain, sore throat, dysphagia,  hemoptysis , cough, dyspnea, wheezing, chest pain, palpitations, orthopnea, edema, abdominal pain, nausea, melena, diarrhea, constipation, flank pain, dysuria, hematuria, urinary  Frequency, nocturia, numbness, tingling, seizures,  Focal weakness, Loss of consciousness,  Tremor, insomnia, depression, anxiety, and suicidal ideation.      Objective:  BP 112/76   Pulse 72   Temp 98.5 F (36.9 C) (Oral)   Resp 16   Ht  (1.499 m)   Wt 116 lb (52.6 kg)   SpO2 98%   BMI 23.43 kg/m   Physical Exam   General appearance: alert, cooperative and appears stated age Head: Normocephalic, without obvious abnormality, atraumatic Eyes: conjunctivae/corneas clear. PERRL, EOM's intact. Fundi benign. Ears: normal TM's and external ear canals both ears Nose: Nares normal. Septum midline. Mucosa normal. No drainage or sinus tenderness. Throat: lips, mucosa, and tongue normal; teeth and gums normal Neck: no adenopathy, no carotid bruit, no JVD, supple, symmetrical, trachea midline and thyroid not enlarged, symmetric, no tenderness/mass/nodules Lungs: clear to auscultation bilaterally Breasts: normal appearance, no masses or tenderness Heart: regular rate and rhythm, S1, S2 normal, no murmur, click, rub or gallop Abdomen: soft, non-tender; bowel sounds normal; no masses,  no organomegaly Extremities: extremities normal, atraumatic, no cyanosis or edema Pulses: 2+ and  symmetric Skin: Skin color, texture, turgor normal. No rashes or lesions Neurologic: Alert and oriented X 3, normal strength and tone. Normal symmetric reflexes. Normal coordination and gait.      Assessment & Plan:   Problem List Items Addressed This Visit    Breast cancer screening    Last mammogram was 2016 and normal except for dense breast.  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  During the course of the visit the patient was educated and counseled about appropriate screening and preventive services including :  diabetes screening, lipid analysis with projected  10 year  risk for CAD  ,  nutrition counseling, colorectal cancer screening, and recommended immunizations.  Printed recommendations for health maintenance screenings was given.        Encounter for screening for cervical cancer     Pap SMEAR WAS NORMAL IN 2016  Encounter for therapeutic drug monitoring - Primary    INR is even lower at 1.5  Using 7 mg 5 days per week and 5 mg 2 days (45 mg ) Increase daily dose to 8 mg daily  (56 mg per week)  And repeat INR In 2 week s      Relevant Orders   INR/PT (Completed)   Osteopenia of spine    By DEXA in 2013 at age 57 .  Will repeat this year       Protein C deficiency (HCC)    Second thrombotic event has resulted in lifelong anticoagulation, but she has been negligent in getting monthly INRs.  Will discuss the risks and benefits of thrombin and direct Xa inhibitors vs continued use of warfarin        Other Visit Diagnoses    Long-term use of high-risk medication       Relevant Orders   CBC with Differential/Platelet (Completed)   Pure hypercholesterolemia       Relevant Medications    warfarin (COUMADIN) 5 MG tablet   Other Relevant Orders   Lipid panel (Completed)   Other fatigue       Relevant Orders   Comprehensive metabolic panel (Completed)   TSH (Completed)      I have discontinued Ms. Segar's azithromycin. I have also changed her warfarin.  Meds ordered this encounter  Medications  . warfarin (COUMADIN) 5 MG tablet    Sig: Take 1 tablet (5 mg total) by mouth daily at 6 PM.    Dispense:  90 tablet    Refill:  1    Medications Discontinued During This Encounter  Medication Reason  . azithromycin (ZITHROMAX) 250 MG tablet Therapy completed  . warfarin (COUMADIN) 5 MG tablet Reorder    Follow-up: Return in about 6 months (around 06/23/2017).   Sherlene Shams, MD

## 2016-12-22 NOTE — Patient Instructions (Signed)
Good to see you!   I have refilled the 5 mg warfarin dose,  But regimen is likley to changed pending today's INR  Mammogram ordered  PLEASE TRY TO GET MONTHLY INRS TO AVOID PROBLEMS

## 2016-12-22 NOTE — Progress Notes (Signed)
Pre visit review using our clinic review tool, if applicable. No additional management support is needed unless otherwise documented below in the visit note. 

## 2016-12-24 ENCOUNTER — Other Ambulatory Visit: Payer: Self-pay | Admitting: Internal Medicine

## 2016-12-24 DIAGNOSIS — Z5181 Encounter for therapeutic drug level monitoring: Secondary | ICD-10-CM

## 2016-12-24 DIAGNOSIS — M8588 Other specified disorders of bone density and structure, other site: Secondary | ICD-10-CM | POA: Insufficient documentation

## 2016-12-24 DIAGNOSIS — Z7901 Long term (current) use of anticoagulants: Principal | ICD-10-CM

## 2016-12-24 MED ORDER — WARFARIN SODIUM 3 MG PO TABS: ORAL_TABLET | ORAL | 0 refills | Status: DC

## 2016-12-24 NOTE — Assessment & Plan Note (Signed)
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  Will discuss the risks and benefits of thrombin and direct Xa inhibitors vs continued use of warfarin

## 2016-12-24 NOTE — Assessment & Plan Note (Signed)
By DEXA in 2013 at age 41 .  Will repeat this year

## 2016-12-24 NOTE — Assessment & Plan Note (Signed)
Annual comprehensive preventive exam was done as well as an evaluation and management of chronic conditions .  During the course of the visit the patient was educated and counseled about appropriate screening and preventive services including :  diabetes screening, lipid analysis with projected  10 year  risk for CAD , nutrition counseling, colorectal cancer screening, and recommended immunizations.  Printed recommendations for health maintenance screenings was given.   

## 2016-12-24 NOTE — Assessment & Plan Note (Signed)
Pap SMEAR WAS NORMAL IN 2016

## 2016-12-24 NOTE — Progress Notes (Unsigned)
warfarin

## 2016-12-24 NOTE — Assessment & Plan Note (Addendum)
INR is even lower at 1.5  Using 7 mg 5 days per week and 5 mg 2 days (45 mg ) Increase daily dose to 8 mg daily  (56 mg per week)  And repeat INR In 2 week s

## 2016-12-24 NOTE — Assessment & Plan Note (Signed)
Last mammogram was 2016 and normal except for dense breast.  3d mammogram ordered,

## 2016-12-24 NOTE — Assessment & Plan Note (Addendum)
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

## 2016-12-25 ENCOUNTER — Telehealth: Payer: Self-pay | Admitting: Internal Medicine

## 2016-12-25 NOTE — Telephone Encounter (Signed)
Documented in result notes message.  

## 2016-12-25 NOTE — Telephone Encounter (Signed)
Pt called back returning your call. Thank you!  Call pt @ [phone]

## 2017-01-09 ENCOUNTER — Other Ambulatory Visit (INDEPENDENT_AMBULATORY_CARE_PROVIDER_SITE_OTHER): Payer: Managed Care, Other (non HMO)

## 2017-01-09 DIAGNOSIS — Z5181 Encounter for therapeutic drug level monitoring: Secondary | ICD-10-CM

## 2017-01-09 DIAGNOSIS — Z7901 Long term (current) use of anticoagulants: Secondary | ICD-10-CM | POA: Diagnosis not present

## 2017-01-09 LAB — PROTIME-INR
INR: 4.8 ratio — ABNORMAL HIGH (ref 0.8–1.0)
Prothrombin Time: 52.3 s — ABNORMAL HIGH (ref 9.6–13.1)

## 2017-01-10 ENCOUNTER — Telehealth: Payer: Self-pay

## 2017-01-10 DIAGNOSIS — Z1239 Encounter for other screening for malignant neoplasm of breast: Secondary | ICD-10-CM

## 2017-01-10 NOTE — Telephone Encounter (Signed)
-----   Message from Sherlene Shams, MD sent at 01/09/2017  8:43 PM EDT ----- Coumadin level (INR) is  Too high (goal is 2.0 to 3.0)   Please confirm current  regimen is 8 mg so I can lower her dose .  Take NO WARFARIN ON Wednesday OR Thursday AND RESUME 7 MG DAILY STARTING ON Friday  .  PLEASE SEND AN RX FOR 4 MG TABLET TO HER PHARMACY . SHE HAS A 3 MG TABLET AND MAY NEED REFILLS ON IT . REPEAT INR IN 2 WEEK S

## 2017-01-19 ENCOUNTER — Encounter: Payer: Self-pay | Admitting: Internal Medicine

## 2017-01-23 ENCOUNTER — Other Ambulatory Visit: Payer: Self-pay | Admitting: Radiology

## 2017-01-23 DIAGNOSIS — Z5181 Encounter for therapeutic drug level monitoring: Secondary | ICD-10-CM

## 2017-01-23 DIAGNOSIS — Z7901 Long term (current) use of anticoagulants: Principal | ICD-10-CM

## 2017-01-24 ENCOUNTER — Other Ambulatory Visit: Payer: Managed Care, Other (non HMO)

## 2017-02-05 ENCOUNTER — Other Ambulatory Visit (INDEPENDENT_AMBULATORY_CARE_PROVIDER_SITE_OTHER): Payer: Managed Care, Other (non HMO)

## 2017-02-05 ENCOUNTER — Encounter (INDEPENDENT_AMBULATORY_CARE_PROVIDER_SITE_OTHER): Payer: Self-pay

## 2017-02-05 DIAGNOSIS — Z7901 Long term (current) use of anticoagulants: Secondary | ICD-10-CM | POA: Diagnosis not present

## 2017-02-05 DIAGNOSIS — Z5181 Encounter for therapeutic drug level monitoring: Secondary | ICD-10-CM | POA: Diagnosis not present

## 2017-02-05 LAB — PROTIME-INR
INR: 1.8 ratio — ABNORMAL HIGH (ref 0.8–1.0)
Prothrombin Time: 18.9 s — ABNORMAL HIGH (ref 9.6–13.1)

## 2017-02-09 ENCOUNTER — Telehealth: Payer: Self-pay | Admitting: Internal Medicine

## 2017-02-09 NOTE — Telephone Encounter (Signed)
Pt called back returning your call. Please advise, thank you!  Call pt @ [phone]

## 2017-02-09 NOTE — Telephone Encounter (Signed)
See result note.  

## 2017-02-14 ENCOUNTER — Ambulatory Visit
Admission: RE | Admit: 2017-02-14 | Discharge: 2017-02-14 | Disposition: A | Discharge status: Home / Self Care | Payer: Managed Care, Other (non HMO) | Source: Ambulatory Visit | Attending: Internal Medicine | Admitting: Internal Medicine

## 2017-02-14 DIAGNOSIS — Z1239 Encounter for other screening for malignant neoplasm of breast: Secondary | ICD-10-CM

## 2017-02-14 DIAGNOSIS — Z1231 Encounter for screening mammogram for malignant neoplasm of breast: Secondary | ICD-10-CM | POA: Insufficient documentation

## 2017-02-15 ENCOUNTER — Other Ambulatory Visit: Payer: Self-pay | Admitting: Internal Medicine

## 2017-02-15 DIAGNOSIS — N6320 Unspecified lump in the left breast, unspecified quadrant: Secondary | ICD-10-CM

## 2017-02-15 DIAGNOSIS — R928 Other abnormal and inconclusive findings on diagnostic imaging of breast: Secondary | ICD-10-CM

## 2017-02-26 ENCOUNTER — Ambulatory Visit
Admission: RE | Admit: 2017-02-26 | Discharge: 2017-02-26 | Disposition: A | Discharge status: Home / Self Care | Payer: Managed Care, Other (non HMO) | Source: Ambulatory Visit | Attending: Internal Medicine | Admitting: Internal Medicine

## 2017-02-26 DIAGNOSIS — R928 Other abnormal and inconclusive findings on diagnostic imaging of breast: Secondary | ICD-10-CM | POA: Diagnosis not present

## 2017-02-26 DIAGNOSIS — N6320 Unspecified lump in the left breast, unspecified quadrant: Secondary | ICD-10-CM | POA: Insufficient documentation

## 2017-03-05 ENCOUNTER — Ambulatory Visit (INDEPENDENT_AMBULATORY_CARE_PROVIDER_SITE_OTHER): Payer: Managed Care, Other (non HMO) | Admitting: Internal Medicine

## 2017-03-05 ENCOUNTER — Telehealth: Payer: Self-pay | Admitting: Internal Medicine

## 2017-03-05 ENCOUNTER — Other Ambulatory Visit (INDEPENDENT_AMBULATORY_CARE_PROVIDER_SITE_OTHER): Payer: Managed Care, Other (non HMO)

## 2017-03-05 ENCOUNTER — Encounter: Payer: Self-pay | Admitting: Internal Medicine

## 2017-03-05 VITALS — BP 110/76 | HR 72 | Temp 98.1°F | Resp 16 | Ht 59.0 in | Wt 114.2 lb

## 2017-03-05 DIAGNOSIS — R310 Gross hematuria: Secondary | ICD-10-CM | POA: Diagnosis not present

## 2017-03-05 DIAGNOSIS — Z7901 Long term (current) use of anticoagulants: Secondary | ICD-10-CM

## 2017-03-05 DIAGNOSIS — T45515A Adverse effect of anticoagulants, initial encounter: Secondary | ICD-10-CM

## 2017-03-05 DIAGNOSIS — Z5181 Encounter for therapeutic drug level monitoring: Secondary | ICD-10-CM | POA: Diagnosis not present

## 2017-03-05 DIAGNOSIS — E329 Disease of thymus, unspecified: Secondary | ICD-10-CM | POA: Diagnosis not present

## 2017-03-05 DIAGNOSIS — R319 Hematuria, unspecified: Secondary | ICD-10-CM

## 2017-03-05 LAB — CBC WITH DIFFERENTIAL/PLATELET
BASOS PCT: 1.2 % (ref 0.0–3.0)
Basophils Absolute: 0 10*3/uL (ref 0.0–0.1)
EOS PCT: 6.9 % — AB (ref 0.0–5.0)
Eosinophils Absolute: 0.3 10*3/uL (ref 0.0–0.7)
HCT: 45.9 % (ref 36.0–46.0)
Hemoglobin: 15.1 g/dL — ABNORMAL HIGH (ref 12.0–15.0)
Lymphocytes Relative: 36.8 % (ref 12.0–46.0)
Lymphs Abs: 1.5 10*3/uL (ref 0.7–4.0)
MCHC: 32.9 g/dL (ref 30.0–36.0)
MCV: 91.3 fl (ref 78.0–100.0)
MONO ABS: 0.4 10*3/uL (ref 0.1–1.0)
MONOS PCT: 10.2 % (ref 3.0–12.0)
NEUTROS ABS: 1.8 10*3/uL (ref 1.4–7.7)
NEUTROS PCT: 44.9 % (ref 43.0–77.0)
PLATELETS: 173 10*3/uL (ref 150.0–400.0)
RBC: 5.03 Mil/uL (ref 3.87–5.11)
RDW: 13.1 % (ref 11.5–15.5)
WBC: 4 10*3/uL (ref 4.0–10.5)

## 2017-03-05 LAB — URINALYSIS, ROUTINE W REFLEX MICROSCOPIC
Bilirubin Urine: NEGATIVE
KETONES UR: NEGATIVE
Leukocytes, UA: NEGATIVE
Nitrite: NEGATIVE
Total Protein, Urine: NEGATIVE
UROBILINOGEN UA: 0.2 (ref 0.0–1.0)
Urine Glucose: NEGATIVE
WBC UA: NONE SEEN (ref 0–?)
pH: 7 (ref 5.0–8.0)

## 2017-03-05 LAB — PROTIME-INR
INR: 3.7 ratio — ABNORMAL HIGH (ref 0.8–1.0)
Prothrombin Time: 40 s — ABNORMAL HIGH (ref 9.6–13.1)

## 2017-03-05 NOTE — Telephone Encounter (Signed)
Pt called and c/o peeing blood since yesterday, pt is on coumadin. Pt just got off cycle but states that the blood is coming from her urine. Transferred call to [facility].  Call pt @ [phone]

## 2017-03-05 NOTE — Patient Instructions (Addendum)
No coumadin today   Take 5 mg tomorrow   Take 7 mgs daily starting on  Wednesday ,  And  One day per week (Saturday) take 10 mg   INR recheck in  2  Weeks

## 2017-03-05 NOTE — Telephone Encounter (Signed)
Patient current  Coumadin dose is 8 mg daily,  Patient stated she noticed it yesterday afternoon when she went urinate, and has had blood in urine ever since, patient stopped her cycle normally just  few days before this started has had no bleeding gums, but feels weak and tired today. With blood in urine . Yesterday the urine was really red, but today has not been as red but sinks to the bottom of when she urinates , patient held last nights dose of coumadin.

## 2017-03-05 NOTE — Progress Notes (Signed)
Subjective:  Patient ID: Sabrina Woodard, female    DOB: 06/17/1976  Age: 41 y.o. MRN: [id number]  CC: The primary encounter diagnosis was Encounter for therapeutic drug monitoring. Diagnoses of Anticoagulant-induced hematuria and Disease of thymus gland (HCC) were also pertinent to this visit.  HPI Sabrina Woodard presents for evaluation of hematuria with clots, first noted yesterday,  No other  BLEEDING symptoms.  Had a normal menstrual period which ended just a few days ago and was actually shorter than usual  Held her dose of coumadin (8 mg daily) yesterday . No history of flank pain or dysuria,  But has been feeling more faituge than usual.  Exercises vigorously daily and changed her routine recently.  INR is 3.7 today,  UA is normal.  Outpatient Medications Prior to Visit  Medication Sig Dispense Refill  . warfarin (COUMADIN) 5 MG tablet Take 1 tablet (5 mg total) by mouth daily at 6 PM. 90 tablet 1  . warfarin (COUMADIN) 3 MG tablet TAKE WITH 5 MG FOR TOTAL DAILY DOSE OF 8 MG 90 tablet 0   No facility-administered medications prior to visit.     Review of Systems;  Patient denies headache, fevers, malaise, unintentional weight loss, skin rash, eye pain, sinus congestion and sinus pain, sore throat, dysphagia,  hemoptysis , cough, dyspnea, wheezing, chest pain, palpitations, orthopnea, edema, abdominal pain, nausea, melena, diarrhea, constipation, flank pain, dysuria, hematuria, urinary  Frequency, nocturia, numbness, tingling, seizures,  Focal weakness, Loss of consciousness,  Tremor, insomnia, depression, anxiety, and suicidal ideation.      Objective:  BP 110/76   Pulse 72   Temp 98.1 F (36.7 C) (Oral)   Resp 16   Ht 4\' 11"  (1.499 m)   Wt 114 lb 4 oz (51.8 kg)   LMP 03/02/2017   SpO2 98%   BMI 23.08 kg/m   BP Readings from Last 3 Encounters:  03/05/17 110/76  12/22/16 112/76  07/10/16 130/76    Wt Readings from Last 3 Encounters:  03/05/17 114 lb 4 oz (51.8 kg)    12/22/16 116 lb (52.6 kg)  07/10/16 113 lb 9.6 oz (51.5 kg)    General appearance: alert, cooperative and appears stated age Ears: normal TM's and external ear canals both ears Throat: lips, mucosa, and tongue normal; teeth and gums normal Neck: no adenopathy, no carotid bruit, supple, symmetrical, trachea midline and thyroid not enlarged, symmetric, no tenderness/mass/nodules Back: symmetric, no curvature. ROM normal. No CVA tenderness. Lungs: clear to auscultation bilaterally Heart: regular rate and rhythm, S1, S2 normal, no murmur, click, rub or gallop Abdomen: soft, non-tender; bowel sounds normal; no masses,  no organomegaly Pulses: 2+ and symmetric Skin: Skin color, texture, turgor normal. No rashes or lesions Lymph nodes: Cervical, supraclavicular, and axillary nodes normal.  No results found for: HGBA1C  Lab Results  Component Value Date   CREATININE 0.86 12/22/2016   CREATININE 1.13 04/22/2015   CREATININE 1.07 (H) 03/31/2015    Lab Results  Component Value Date   WBC 4.0 03/05/2017   HGB 15.1 (H) 03/05/2017   HCT 45.9 03/05/2017   PLT 173.0 03/05/2017   GLUCOSE 89 12/22/2016   CHOL 187 12/22/2016   TRIG 76.0 12/22/2016   HDL 53.40 12/22/2016   LDLCALC 118 (H) 12/22/2016   ALT 16 12/22/2016   AST 24 12/22/2016   NA 141 12/22/2016   K 4.2 12/22/2016   CL 107 12/22/2016   CREATININE 0.86 12/22/2016   BUN 11 12/22/2016   CO2  28 12/22/2016   TSH 1.36 12/22/2016   INR 3.7 (H) 03/05/2017    US Breast Ltd Uni Left Inc Axilla  Result Date: 02/26/2017 CLINICAL DATA:  Callback from screening mammogram EXAM: 2D DIGITAL DIAGNOSTIC LEFT MAMMOGRAM WITH CAD AND ADJUNCT TOMO ULTRASOUND LEFT BREAST COMPARISON:  Previous exam(s). ACR Breast Density Category b: There are scattered areas of fibroglandular density. FINDINGS: MLO spot-compression views of the left breast and a full lateral view of the left breast were performed with tomosynthesis. The partially imaged oval mass  within the posterior, superior left breast seen on screening mammogram was not able to be reproduced on today's images. This mass was seen to be within the lateral left breast on the tomosynthesis images from the screening mammogram. Mammographic images were processed with CAD. Targeted ultrasound of the lateral left breast was performed demonstrating a normal appearing intramammary lymph node at 3 o'clock, 8 cm from the nipple measuring 6 x 3 x 6 mm, corresponding to the partially imaged mass seen on the screening mammogram. Ultrasound of the left axilla was also performed demonstrating multiple normal appearing axillary lymph nodes. IMPRESSION: No mammographic or sonographic evidence of malignancy. RECOMMENDATION: Screening mammogram in one year.(Code:SM-B-01Y) I have discussed the findings and recommendations with the patient. Results were also provided in writing at the conclusion of the visit. If applicable, a reminder letter will be sent to the patient regarding the next appointment. BI-RADS CATEGORY  2: Benign. Electronically Signed   By: Dalphine Handing M.D.   On: 02/26/2017 16:35   Mm Diag Breast Tomo Uni Left  Result Date: 02/26/2017 CLINICAL DATA:  Callback from screening mammogram EXAM: 2D DIGITAL DIAGNOSTIC LEFT MAMMOGRAM WITH CAD AND ADJUNCT TOMO ULTRASOUND LEFT BREAST COMPARISON:  Previous exam(s). ACR Breast Density Category b: There are scattered areas of fibroglandular density. FINDINGS: MLO spot-compression views of the left breast and a full lateral view of the left breast were performed with tomosynthesis. The partially imaged oval mass within the posterior, superior left breast seen on screening mammogram was not able to be reproduced on today's images. This mass was seen to be within the lateral left breast on the tomosynthesis images from the screening mammogram. Mammographic images were processed with CAD. Targeted ultrasound of the lateral left breast was performed demonstrating a normal  appearing intramammary lymph node at 3 o'clock, 8 cm from the nipple measuring 6 x 3 x 6 mm, corresponding to the partially imaged mass seen on the screening mammogram. Ultrasound of the left axilla was also performed demonstrating multiple normal appearing axillary lymph nodes. IMPRESSION: No mammographic or sonographic evidence of malignancy. RECOMMENDATION: Screening mammogram in one year.(Code:SM-B-01Y) I have discussed the findings and recommendations with the patient. Results were also provided in writing at the conclusion of the visit. If applicable, a reminder letter will be sent to the patient regarding the next appointment. BI-RADS CATEGORY  2: Benign. Electronically Signed   By: Dalphine Handing M.D.   On: 02/26/2017 16:35    Assessment & Plan:   Problem List Items Addressed This Visit    Encounter for therapeutic drug monitoring - Primary    INR was 3.7 on 8 mg daily dose ( 56 mg weekly) ,   Which was suspended one day prior to INR due to gross hematuria.  INR was low on 7 mg daily dose (49 mg weekly) New regimen 7 mg daily x 6 days,  With one day of 10 mg per week. (52 mg weekly) will  Repeat INR in  2 weeks       Relevant Orders   INR/PT   Disease of thymus gland [facility](HCC)    Diagnostic mammogram done recently due to abnormal screening mammogram on left. .  Additional images and ultrasound normal   Follow up in one year.       Anticoagulant-induced hematuria    Self limiting.  None in urine today.  INR was 3.7 after one day suspension of 8 mg daily dose.  If recurrent with therapeutic range INR in future,  Will eval renal system with CT          I am having Sabrina Woodard maintain her warfarin.  No orders of the defined types were placed in this encounter.   Medications Discontinued During This Encounter  Medication Reason  . warfarin (COUMADIN) 3 MG tablet     Follow-up: No Follow-up on file.   Sherlene ShamsULLO, Amanada Philbrick L, MD

## 2017-03-05 NOTE — Telephone Encounter (Signed)
Patient has been notified to come in for labs and to be scheduled for 5 pm today.

## 2017-03-05 NOTE — Telephone Encounter (Signed)
[facility] called back stating no appt avail for pt. [facility] also stated that pt needs to have a PT/INR due to pt bleeding. Pt was scheduled to come in on 03/13/17. Her lab appt was rescheduled to a sooner date. Pt was also advised if the bleeding gets worse to go to the ER. Thank you!

## 2017-03-05 NOTE — Telephone Encounter (Signed)
[facility] - [place] TELEPHONE ADVICE RECORD [facility]  Patient Name: Sabrina Woodard  DOB: [birth year]    Initial Comment Caller states c/o blood in urine, is on Coumadin.   Nurse Assessment  Nurse: Laural BenesJohnson, RN, Dondra SpryGail Date/Time Lamount Cohen(Eastern Time): 03/05/2017 9:38:58 AM  Confirm and document reason for call. If symptomatic, describe symptoms. ---Sabrina Woodard noticed blood in urine yesterday -- is on coumadin DVT  Does the patient have any new or worsening symptoms? ---Yes  Will a triage be completed? ---Yes  Related visit to physician within the last 2 weeks? ---No  Does the PT have any chronic conditions? (i.e. diabetes, asthma, etc.) ---Yes  List chronic conditions. ---Protein C disease Anticoagulant [place]py  Is the patient pregnant or possibly pregnant? (Ask all females between the ages of 8312-55) ---No  Is this a behavioral health or substance abuse call? ---No     Guidelines    Guideline Title Affirmed Question Affirmed Notes       Final Disposition User        Comments  NOTE no appointment available --called office and advised the gravity of situation; on coumadin and having blood in urine -- at first lot of blood now 2pm appointment tomorrow. nOTE please call patient and advise if she needs to hold her coumadin dose for tonight; Nurse advised she should probably hold it but since MD is in the office let them decide.

## 2017-03-05 NOTE — Telephone Encounter (Signed)
Urine and labs ordered

## 2017-03-05 NOTE — Telephone Encounter (Signed)
FYI Patient has been scheduled with Dr. Darrick Huntsmanullo At 5pm -Pt will come in prior to appt for Labs

## 2017-03-06 ENCOUNTER — Other Ambulatory Visit: Payer: Managed Care, Other (non HMO)

## 2017-03-06 DIAGNOSIS — R319 Hematuria, unspecified: Secondary | ICD-10-CM | POA: Insufficient documentation

## 2017-03-06 DIAGNOSIS — T45515A Adverse effect of anticoagulants, initial encounter: Secondary | ICD-10-CM

## 2017-03-06 NOTE — Assessment & Plan Note (Signed)
INR was 3.7 on 8 mg daily dose ( 56 mg weekly) ,   Which was suspended one day prior to INR due to gross hematuria.  INR was low on 7 mg daily dose (49 mg weekly) New regimen 7 mg daily x 6 days,  With one day of 10 mg per week. (52 mg weekly) will  Repeat INR in 2 weeks

## 2017-03-06 NOTE — Assessment & Plan Note (Signed)
Self limiting.  None in urine today.  INR was 3.7 after one day suspension of 8 mg daily dose.  If recurrent with therapeutic range INR in future,  Will eval renal system with CT

## 2017-03-06 NOTE — Assessment & Plan Note (Signed)
Diagnostic mammogram done recently due to abnormal screening mammogram on left. .  Additional images and ultrasound normal   Follow up in one year.

## 2017-03-07 LAB — URINE CULTURE

## 2017-03-13 ENCOUNTER — Other Ambulatory Visit: Payer: Managed Care, Other (non HMO)

## 2017-03-19 ENCOUNTER — Other Ambulatory Visit (INDEPENDENT_AMBULATORY_CARE_PROVIDER_SITE_OTHER): Payer: 59

## 2017-03-19 ENCOUNTER — Ambulatory Visit (INDEPENDENT_AMBULATORY_CARE_PROVIDER_SITE_OTHER): Payer: Managed Care, Other (non HMO) | Admitting: *Deleted

## 2017-03-19 VITALS — BP 120/78 | HR 84 | Temp 97.8°F | Resp 16

## 2017-03-19 DIAGNOSIS — R310 Gross hematuria: Secondary | ICD-10-CM

## 2017-03-19 DIAGNOSIS — T45515A Adverse effect of anticoagulants, initial encounter: Secondary | ICD-10-CM

## 2017-03-19 DIAGNOSIS — Z5181 Encounter for therapeutic drug level monitoring: Secondary | ICD-10-CM

## 2017-03-19 DIAGNOSIS — R319 Hematuria, unspecified: Secondary | ICD-10-CM | POA: Diagnosis not present

## 2017-03-19 LAB — POCT URINALYSIS DIPSTICK
GLUCOSE UA: NEGATIVE
KETONES UA: 15
NITRITE UA: NEGATIVE
PH UA: 5 (ref 5.0–8.0)
Protein, UA: >=300
Spec Grav, UA: 1.025 (ref 1.010–1.025)
Urobilinogen, UA: 1 E.U./dL

## 2017-03-19 LAB — URINALYSIS, MICROSCOPIC ONLY

## 2017-03-19 LAB — PROTIME-INR
INR: 3.2 ratio — AB (ref 0.8–1.0)
PROTHROMBIN TIME: 33.7 s — AB (ref 9.6–13.1)

## 2017-03-19 NOTE — Progress Notes (Signed)
Urine today dark red in color, started on 03/18/17 as light red color and has gradually gotten darker. Urine in lab very dark red in color ,patient says she feels tired and crampy in her pelvic area.has had some occasional right flank pain but far between like a spasm that comes and goes. Regimen 7 mg daily x 6 days,  With one day of 10 mg per week.

## 2017-03-19 NOTE — Addendum Note (Signed)
Addended by: WRIGHT, LATOYA S on: 03/19/2017 03:43 PM   Modules accepted: Orders  

## 2017-03-20 ENCOUNTER — Encounter: Payer: Self-pay | Admitting: Internal Medicine

## 2017-03-20 DIAGNOSIS — R310 Gross hematuria: Secondary | ICD-10-CM | POA: Insufficient documentation

## 2017-03-21 LAB — URINE CULTURE

## 2017-03-22 ENCOUNTER — Other Ambulatory Visit: Payer: Self-pay | Admitting: Internal Medicine

## 2017-03-23 ENCOUNTER — Other Ambulatory Visit: Payer: Self-pay | Admitting: Internal Medicine

## 2017-03-23 DIAGNOSIS — R310 Gross hematuria: Secondary | ICD-10-CM

## 2017-03-23 NOTE — Progress Notes (Signed)
CT

## 2017-03-26 ENCOUNTER — Telehealth: Payer: Self-pay

## 2017-03-26 DIAGNOSIS — R310 Gross hematuria: Secondary | ICD-10-CM

## 2017-03-26 NOTE — Telephone Encounter (Signed)
Reordered correct CT

## 2017-03-30 ENCOUNTER — Ambulatory Visit
Admission: RE | Admit: 2017-03-30 | Discharge: 2017-03-30 | Disposition: A | Discharge status: Home / Self Care | Payer: 59 | Source: Ambulatory Visit | Attending: Internal Medicine | Admitting: Internal Medicine

## 2017-03-30 DIAGNOSIS — N201 Calculus of ureter: Secondary | ICD-10-CM | POA: Insufficient documentation

## 2017-03-30 DIAGNOSIS — R310 Gross hematuria: Secondary | ICD-10-CM | POA: Diagnosis not present

## 2017-03-30 MED ORDER — IOPAMIDOL (ISOVUE-300) INJECTION 61%
125.0000 mL | Freq: Once | INTRAVENOUS | Status: AC | PRN
  Administered 2017-03-30: 125 mL via INTRAVENOUS

## 2017-04-01 ENCOUNTER — Other Ambulatory Visit: Payer: Self-pay | Admitting: Internal Medicine

## 2017-04-01 MED ORDER — TAMSULOSIN HCL 0.4 MG PO CAPS: 0.4000 mg | ORAL_CAPSULE | Freq: Every day | ORAL | 0 refills | Status: DC

## 2017-04-02 ENCOUNTER — Other Ambulatory Visit: Payer: Self-pay | Admitting: Radiology

## 2017-04-02 DIAGNOSIS — Z5181 Encounter for therapeutic drug level monitoring: Secondary | ICD-10-CM

## 2017-04-02 DIAGNOSIS — Z7901 Long term (current) use of anticoagulants: Principal | ICD-10-CM

## 2017-04-03 ENCOUNTER — Other Ambulatory Visit (INDEPENDENT_AMBULATORY_CARE_PROVIDER_SITE_OTHER): Payer: 59

## 2017-04-03 DIAGNOSIS — Z7901 Long term (current) use of anticoagulants: Secondary | ICD-10-CM

## 2017-04-03 DIAGNOSIS — Z5181 Encounter for therapeutic drug level monitoring: Secondary | ICD-10-CM

## 2017-04-03 LAB — PROTIME-INR
INR: 2.7 ratio — AB (ref 0.8–1.0)
PROTHROMBIN TIME: 29.4 s — AB (ref 9.6–13.1)

## 2017-04-27 ENCOUNTER — Other Ambulatory Visit: Payer: Self-pay | Admitting: Internal Medicine

## 2017-05-03 ENCOUNTER — Telehealth: Payer: Self-pay | Admitting: *Deleted

## 2017-05-03 ENCOUNTER — Other Ambulatory Visit: Payer: Self-pay | Admitting: Radiology

## 2017-05-03 DIAGNOSIS — R109 Unspecified abdominal pain: Secondary | ICD-10-CM

## 2017-05-03 DIAGNOSIS — Z7901 Long term (current) use of anticoagulants: Principal | ICD-10-CM

## 2017-05-03 DIAGNOSIS — Z5181 Encounter for therapeutic drug level monitoring: Secondary | ICD-10-CM

## 2017-05-03 NOTE — Telephone Encounter (Signed)
Disregard last message, patient has been scheduled for 10:30 tomorrow morning to see you.

## 2017-05-03 NOTE — Telephone Encounter (Signed)
Patient stated she is having right lower flank pain radiating to the front with some nausea and her stomach has been upset today. Denies fever, chills, vomiting or any other symptoms.

## 2017-05-03 NOTE — Telephone Encounter (Signed)
Pt will be coming in the morning for lab, and requested to have a urine specimen added to her orders in reference to a kidney stone

## 2017-05-03 NOTE — Telephone Encounter (Signed)
UA with micro and culture ordered so if she wants to go to lab before appt to give specimen she can

## 2017-05-04 ENCOUNTER — Encounter: Payer: Self-pay | Admitting: Internal Medicine

## 2017-05-04 ENCOUNTER — Other Ambulatory Visit (INDEPENDENT_AMBULATORY_CARE_PROVIDER_SITE_OTHER): Payer: 59

## 2017-05-04 ENCOUNTER — Ambulatory Visit (INDEPENDENT_AMBULATORY_CARE_PROVIDER_SITE_OTHER): Payer: 59 | Admitting: Internal Medicine

## 2017-05-04 ENCOUNTER — Ambulatory Visit
Admission: RE | Admit: 2017-05-04 | Discharge: 2017-05-04 | Disposition: A | Discharge status: Home / Self Care | Payer: 59 | Source: Ambulatory Visit | Attending: Internal Medicine | Admitting: Internal Medicine

## 2017-05-04 VITALS — BP 104/78 | HR 64 | Temp 97.9°F | Resp 15 | Ht 59.0 in | Wt 118.0 lb

## 2017-05-04 DIAGNOSIS — R109 Unspecified abdominal pain: Secondary | ICD-10-CM

## 2017-05-04 DIAGNOSIS — I700 Atherosclerosis of aorta: Secondary | ICD-10-CM | POA: Insufficient documentation

## 2017-05-04 DIAGNOSIS — N201 Calculus of ureter: Secondary | ICD-10-CM | POA: Diagnosis not present

## 2017-05-04 DIAGNOSIS — Z7901 Long term (current) use of anticoagulants: Secondary | ICD-10-CM | POA: Diagnosis not present

## 2017-05-04 DIAGNOSIS — R1031 Right lower quadrant pain: Secondary | ICD-10-CM

## 2017-05-04 DIAGNOSIS — N132 Hydronephrosis with renal and ureteral calculous obstruction: Secondary | ICD-10-CM | POA: Insufficient documentation

## 2017-05-04 DIAGNOSIS — Z5181 Encounter for therapeutic drug level monitoring: Secondary | ICD-10-CM

## 2017-05-04 HISTORY — DX: Hydronephrosis with renal and ureteral calculous obstruction: N13.2

## 2017-05-04 LAB — URINALYSIS, ROUTINE W REFLEX MICROSCOPIC
Ketones, ur: NEGATIVE
Leukocytes, UA: NEGATIVE
Nitrite: NEGATIVE
Specific Gravity, Urine: >=1.03 — AB (ref 1.000–1.030)
Total Protein, Urine: 100 — AB
Urine Glucose: NEGATIVE
Urobilinogen, UA: 0.2 (ref 0.0–1.0)
pH: 5.5 (ref 5.0–8.0)

## 2017-05-04 LAB — PROTIME-INR
INR: 2.8 ratio — AB (ref 0.8–1.0)
PROTHROMBIN TIME: 29.5 s — AB (ref 9.6–13.1)

## 2017-05-04 LAB — CBC WITH DIFFERENTIAL/PLATELET
Basophils Absolute: 0.1 10*3/uL (ref 0.0–0.1)
Basophils Relative: 1.2 % (ref 0.0–3.0)
EOS PCT: 4.1 % (ref 0.0–5.0)
Eosinophils Absolute: 0.2 10*3/uL (ref 0.0–0.7)
HCT: 44.4 % (ref 36.0–46.0)
Hemoglobin: 14.9 g/dL (ref 12.0–15.0)
LYMPHS ABS: 1.7 10*3/uL (ref 0.7–4.0)
Lymphocytes Relative: 36.1 % (ref 12.0–46.0)
MCHC: 33.7 g/dL (ref 30.0–36.0)
MCV: 91.5 fl (ref 78.0–100.0)
MONO ABS: 0.4 10*3/uL (ref 0.1–1.0)
MONOS PCT: 7.6 % (ref 3.0–12.0)
NEUTROS ABS: 2.4 10*3/uL (ref 1.4–7.7)
NEUTROS PCT: 51 % (ref 43.0–77.0)
PLATELETS: 186 10*3/uL (ref 150.0–400.0)
RBC: 4.85 Mil/uL (ref 3.87–5.11)
RDW: 13.2 % (ref 11.5–15.5)
WBC: 4.8 10*3/uL (ref 4.0–10.5)

## 2017-05-04 LAB — BASIC METABOLIC PANEL
BUN: 8 mg/dL (ref 6–23)
CALCIUM: 8.8 mg/dL (ref 8.4–10.5)
CO2: 27 meq/L (ref 19–32)
Chloride: 105 mEq/L (ref 96–112)
Creatinine, Ser: 0.85 mg/dL (ref 0.40–1.20)
GFR: 77.8 mL/min (ref >60.00)
GLUCOSE: 92 mg/dL (ref 70–99)
POTASSIUM: 4.1 meq/L (ref 3.5–5.1)
SODIUM: 139 meq/L (ref 135–145)

## 2017-05-04 MED ORDER — HYDROCODONE-ACETAMINOPHEN 10-325 MG PO TABS: 1.0000 | ORAL_TABLET | Freq: Four times a day (QID) | ORAL | 0 refills | Status: DC | PRN

## 2017-05-04 MED ORDER — ONDANSETRON 4 MG PO TBDP: 4.0000 mg | ORAL_TABLET | Freq: Three times a day (TID) | ORAL | 0 refills | Status: DC | PRN

## 2017-05-04 MED ORDER — CELECOXIB 200 MG PO CAPS: 200.0000 mg | ORAL_CAPSULE | Freq: Two times a day (BID) | ORAL | 0 refills | Status: DC

## 2017-05-04 MED ORDER — CIPROFLOXACIN HCL 250 MG PO TABS
250.0000 mg | ORAL_TABLET | Freq: Two times a day (BID) | ORAL | 0 refills | Status: DC
Start: 2017-05-04 — End: 2017-05-21

## 2017-05-04 NOTE — Progress Notes (Signed)
Subjective:  Patient ID: Sabrina Woodard, female    DOB: 10/23/1975  Age: 41 y.o. MRN: [id number]  CC: The primary encounter diagnosis was Ureteral stone. Diagnoses of Right lower quadrant abdominal pain and Ureteral stone with hydronephrosis were also pertinent to this visit.  HPI Sabrina Woodard presents for  Follow up on hematuria  Noted in June with  CT confirming a  5 mm right sided ureteral stone without hydronephrosis  Patient was unable to tolerate the Flomax  Due to hypotension.  Has been drinking lots of water and lemonade,  Doesn't think she passed the stone.    Nausea and hematuria started yesterday,  Yesterday had moderate to severe Right sided back pain ,  Today the pain is more colicky and the nausea is intermittent .   Has not passed a stone.  Denies fevers,  No vomiting .  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 warfarin (COUMADIN) 2 MG tablet TAKE 1 TABLET DAILY ALONG WITH 5 MG TABLET FOR TOTAL DAILY DOSE 7 MG 60 tablet 1  . warfarin (COUMADIN) 3 MG tablet TAKE WITH 5 MG FOR TOTAL DAILY DOSE OF 8 MG 90 tablet 0  . warfarin (COUMADIN) 5 MG tablet Take 1 tablet (5 mg total) by mouth daily at 6 PM. 90 tablet 1  . tamsulosin (FLOMAX) 0.4 MG CAPS capsule Take 1 capsule (0.4 mg total) by mouth daily. (Patient not taking: Reported on 05/04/2017) 30 capsule 0   No facility-administered medications prior to visit.     Review of Systems;  Patient denies headache, fevers, malaise, unintentional weight loss, skin rash, eye pain, sinus congestion and sinus pain, sore throat, dysphagia,  hemoptysis , cough, dyspnea, wheezing, chest pain, palpitations, orthopnea, edema, abdominal pain, nausea, melena, diarrhea, constipation, , dysuria, urinary  Frequency, nocturia, numbness, tingling, seizures,  Focal weakness, Loss of consciousness,  Tremor, insomnia, depression,  anxiety, and suicidal ideation.      Objective:  BP 104/78 (BP Location: Left Arm, Patient Position: Sitting, Cuff Size: Normal)   Pulse 64   Temp 97.9 F (36.6 C) (Oral)   Resp 15   Ht 4\' 11"  (1.499 m)   Wt 118 lb (53.5 kg)   SpO2 99%   BMI 23.83 kg/m   BP Readings from Last 3 Encounters:  05/04/17 104/78  03/19/17 120/78  03/05/17 110/76    Wt Readings from Last 3 Encounters:  05/04/17 118 lb (53.5 kg)  03/05/17 114 lb 4 oz (51.8 kg)  12/22/16 116 lb (52.6 kg)    General appearance: alert, cooperative and appears stated age Ears: normal TM's and external ear canals both ears Throat: lips, mucosa, and tongue normal; teeth and gums normal Neck: no adenopathy, no carotid bruit, supple, symmetrical, trachea midline and thyroid not enlarged, symmetric, no tenderness/mass/nodules Back:  Right flank pain with percussion  Lungs: clear to auscultation bilaterally Heart: regular rate and rhythm, S1, S2 normal, no murmur, click, rub or gallop Abdomen: soft, non-tender; bowel sounds normal; no masses,  no organomegaly Pulses: 2+ and symmetric Skin: Skin color, texture, turgor normal. No rashes or lesions Lymph nodes: Cervical, supraclavicular, and axillary nodes normal.  No results found for: HGBA1C  Lab Results  Component Value Date   CREATININE 0.86 12/22/2016   CREATININE 1.13 04/22/2015   CREATININE 1.07 (H) 03/31/2015    Lab Results  Component Value Date  WBC 4.0 03/05/2017   HGB 15.1 (H) 03/05/2017   HCT 45.9 03/05/2017   PLT 173.0 03/05/2017   GLUCOSE 89 12/22/2016   CHOL 187 12/22/2016   TRIG 76.0 12/22/2016   HDL 53.40 12/22/2016   LDLCALC 118 (H) 12/22/2016   ALT 16 12/22/2016   AST 24 12/22/2016   NA 141 12/22/2016   K 4.2 12/22/2016   CL 107 12/22/2016   CREATININE 0.86 12/22/2016   BUN 11 12/22/2016   CO2 28 12/22/2016   TSH 1.36 12/22/2016   INR 2.8 (H) 05/04/2017    Ct Abdomen Pelvis W Wo Contrast  Result Date: 03/30/2017 CLINICAL DATA:   Gross hematuria x 1 1/2 months, history of kidney stone EXAM: CT ABDOMEN AND PELVIS WITHOUT AND WITH CONTRAST TECHNIQUE: Multidetector CT imaging of the abdomen and pelvis was performed following the standard protocol before and following the bolus administration of intravenous contrast. CONTRAST:  ISOVUE-300 IOPAMIDOL (ISOVUE-300) INJECTION 61% COMPARISON:  None. FINDINGS: Lower chest: Mild patchy opacity in the posterior right lower lobe, likely atelectasis. Hepatobiliary: Liver is within normal limits. Gallbladder is unremarkable. No intrahepatic or extrahepatic ductal dilatation. Pancreas: Within normal limits. Spleen: Within normal limits. Adrenals/Urinary Tract: Adrenal glands are within normal limits. Kidneys are within normal limits.  No enhancing renal lesions. 5 mm proximal right ureteral calculus (series 5/image 40). No hydronephrosis. On delayed imaging, there are no filling defects in the bilateral opacified proximal collecting systems, ureters, or bladder. Bladder is within normal limits. Stomach/Bowel: Stomach is within normal limits. No evidence of bowel obstruction. Normal appendix (series 8/ image 53). Vascular/Lymphatic: No evidence of abdominal aortic aneurysm. No suspicious abdominopelvic lymphadenopathy. Reproductive: Uterus is within normal limits. Ovaries are grossly unremarkable. Other: No abdominopelvic ascites. Musculoskeletal: Visualized osseous structures are within normal limits. IMPRESSION: 5 mm proximal right ureteral calculus.  No hydronephrosis. Electronically Signed   By: Charline Bills M.D.   On: 03/30/2017 12:41    Assessment & Plan:   Problem List Items Addressed This Visit    Ureteral stone with hydronephrosis    Repeat stat CT done today shows increase in size of the right ureteral  stone with mild hydronephrosis.  Cr is still pending.  Patient did not tolerate flomax due to hypotension . NSAID, Vicodin, and urgent Urology referral made.       Relevant  Orders   Ambulatory referral to Urology    Other Visit Diagnoses    Ureteral stone    -  Primary   Relevant Medications   HYDROcodone-acetaminophen (NORCO) 10-325 MG tablet   Other Relevant Orders   Basic metabolic panel   Right lower quadrant abdominal pain       Relevant Orders   CT RENAL STONE STUDY (Completed)   Basic metabolic panel   CBC with Differential/Platelet      I have discontinued Ms. Viverette's tamsulosin. I am also having her start on ciprofloxacin, celecoxib, ondansetron, and HYDROcodone-acetaminophen. Additionally, I am having her maintain her warfarin, warfarin, and warfarin.  Meds ordered this encounter  Medications  . ciprofloxacin (CIPRO) 250 MG tablet    Sig: Take 1 tablet (250 mg total) by mouth 2 (two) times daily.    Dispense:  10 tablet    Refill:  0  . celecoxib (CELEBREX) 200 MG capsule    Sig: Take 1 capsule (200 mg total) by mouth 2 (two) times daily.    Dispense:  30 capsule    Refill:  0  . ondansetron (ZOFRAN ODT) 4 MG disintegrating  tablet    Sig: Take 1 tablet (4 mg total) by mouth every 8 (eight) hours as needed for nausea or vomiting.    Dispense:  20 tablet    Refill:  0  . HYDROcodone-acetaminophen (NORCO) 10-325 MG tablet    Sig: Take 1 tablet by mouth every 6 (six) hours as needed. For kidney stone    Dispense:  30 tablet    Refill:  0    Medications Discontinued During This Encounter  Medication Reason  . tamsulosin (FLOMAX) 0.4 MG CAPS capsule Patient has not taken in last 30 days    Follow-up: No Follow-up on file.   Sherlene Shams, MD

## 2017-05-04 NOTE — Patient Instructions (Signed)
You can take the celebrex every 12 hours and add tylenol or Hydrocodone if needed for pain   If the stone is larger,  Or if your kidney function has become affected, I will be sending you to a urologist to help resolve the stone   Drink as much water with lemon juice as you can stand !

## 2017-05-04 NOTE — Assessment & Plan Note (Signed)
Repeat stat CT done today shows increase in size of the right ureteral  stone with mild hydronephrosis.  Cr is still pending.  Patient did not tolerate flomax due to hypotension . NSAID, Vicodin, and urgent Urology referral made.

## 2017-05-04 NOTE — Telephone Encounter (Signed)
Left message for patient to call back but if she does not return call before appointment she already has a lab appointment for 11:30 today.

## 2017-05-06 LAB — URINE CULTURE

## 2017-05-07 NOTE — Progress Notes (Signed)
05/08/2017 8:44 AM   Sabrina Woodard 1976/02/08 [id number]  Referring provider: Crecencio Mc, MD [address] [place], [place] [address]  Chief Complaint  Patient presents with  . New Patient (Initial Visit)    urethral stone referred by Dr. Derrel Nip    HPI: Patient is a 41 year old Caucasian female who is referred by Dr. Derrel Nip for nephrolithiasis.  She states that she started experiencing gross hematuria with clots that started in June. She then started feeling fatigued with occasional crampy right-sided flank pain.   Contrast CT performed on 03/30/2017 noted a 5 mm proximal right ureteral calculus.  No hydronephrosis.  She proceeded with MET with tamsulosin.    Unfortunately, she could not tolerate the tamsulosin due to hypotension.  A CT Renal stone study noted a right-sided obstructive uropathy with 6 x 6 x 3 mm ureteropelvic junction stone causing mild hydronephrosis. The stone appears slightly more proximal than on the prior study.  No other nephroureterolithiasis.  Minimal Aortic Atherosclerosis (ICD10-I70.0).  She was then referred to [place].  Today, she still has not passed a fragment. She has been experiencing urgency, nocturia and blood in the urine. She is also still experiencing intermittent right-sided flank pain associated with nausea and gross hematuria.  She is not experiencing fevers or chills.  The pain is a 6-7/10.  Nothing helps the pain.  Nothing makes it worse.    WBC count was 4.8.  Creatinine was 0.85.  Urine culture was positive for beta hemolytic strep.    UA positive for > 30 RBC's.    She does have a prior history of stones.      Reviewed referral notes - see above  PMH: Past Medical History:  Diagnosis Date  . Anxiety   . Chronic kidney disease 2014   kidney stones  . 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 DVT (deep venous thrombosis) ([facility])   . Pneumothorax     Surgical History: Past Surgical History:  Procedure  Laterality Date  . TONSILLECTOMY AND ADENOIDECTOMY Bilateral 1995    Home Medications:  Allergies as of 05/08/2017      Reactions   Penicillins Anaphylaxis   Sulfa Antibiotics Rash      Medication List       Accurate as of 05/08/17 11:59 PM. Always use your most recent med list.          celecoxib 200 MG capsule Commonly known as:  CELEBREX Take 1 capsule (200 mg total) by mouth 2 (two) times daily.   ciprofloxacin 250 MG tablet Commonly known as:  CIPRO Take 1 tablet (250 mg total) by mouth 2 (two) times daily.   clindamycin 300 MG capsule Commonly known as:  CLEOCIN Take 1 capsule (300 mg total) by mouth 3 (three) times daily.   enoxaparin 60 MG/0.6ML injection Commonly known as:  LOVENOX Inject 0.55 mLs (55 mg total) into the skin every 12 (twelve) hours.   HYDROcodone-acetaminophen 10-325 MG tablet Commonly known as:  NORCO Take 1 tablet by mouth every 6 (six) hours as needed. For kidney stone   ondansetron 4 MG disintegrating tablet Commonly known as:  ZOFRAN ODT Take 1 tablet (4 mg total) by mouth every 8 (eight) hours as needed for nausea or vomiting.   warfarin 5 MG tablet Commonly known as:  COUMADIN Take 1 tablet (5 mg total) by mouth daily at 6 PM.   warfarin 3 MG tablet Commonly known as:  COUMADIN  TAKE WITH 5 MG FOR TOTAL DAILY DOSE OF 8 MG   warfarin 2 MG tablet Commonly known as:  COUMADIN TAKE 1 TABLET DAILY ALONG WITH 5 MG TABLET FOR TOTAL DAILY DOSE 7 MG            Discharge Care Instructions        Start     Ordered   05/08/17 0000  Urinalysis, Complete     05/08/17 1327   05/08/17 0000  CULTURE, URINE COMPREHENSIVE     05/08/17 1327   05/08/17 0000  clindamycin (CLEOCIN) 300 MG capsule  3 times daily    Question:  Supervising Provider  Answer:  Hollice Espy   05/08/17 1637   05/08/17 0000  Microscopic Examination     05/08/17 0000      Allergies:  Allergies  Allergen Reactions  . Penicillins Anaphylaxis  . Sulfa  Antibiotics Rash    Family History: Family History  Problem Relation Age of Onset  . Hyperlipidemia Mother   . Hypertension Mother   . Hyperlipidemia Father   . Hypertension Father   . Prostate cancer Father 37       prostate   . Stroke Maternal Grandfather   . Cancer Paternal Grandmother 98       breast ca   . Breast cancer Paternal Grandmother   . COPD Paternal Grandfather   . Kidney cancer Neg Hx   . Bladder Cancer Neg Hx     Social History:  reports that she has never smoked. She has never used smokeless tobacco. She reports that she does not drink alcohol or use drugs.  ROS: UROLOGY Frequent Urination?: No Hard to postpone urination?: Yes Burning/pain with urination?: No Get up at night to urinate?: Yes Leakage of urine?: No Urine stream starts and stops?: No Trouble starting stream?: No Do you have to strain to urinate?: No Blood in urine?: Yes Urinary tract infection?: No Sexually transmitted disease?: No Injury to kidneys or bladder?: No Painful intercourse?: Yes Weak stream?: No Currently pregnant?: No Vaginal bleeding?: No Last menstrual period?: 04/29/2017  Gastrointestinal Nausea?: Yes Vomiting?: No Indigestion/heartburn?: No Diarrhea?: Yes Constipation?: No  Constitutional Fever: No Night sweats?: No Weight loss?: No Fatigue?: Yes  Skin Skin rash/lesions?: No Itching?: No  Eyes Blurred vision?: No Double vision?: No  Ears/Nose/Throat Sore throat?: No Sinus problems?: No  Hematologic/Lymphatic Swollen glands?: No Easy bruising?: No  Cardiovascular Leg swelling?: No Chest pain?: No  Respiratory Cough?: No Shortness of breath?: No  Endocrine Excessive thirst?: No  Musculoskeletal Back pain?: Yes Joint pain?: No  Neurological Headaches?: Yes Dizziness?: No  Psychologic Depression?: No Anxiety?: Yes  Physical Exam: BP 122/82   Pulse 91   Ht _0  (1.499 m)   Wt 118 lb 1.6 oz (53.6 kg)   LMP 04/29/2017   BMI  23.85 kg/m   Constitutional: Well nourished. Alert and oriented, No acute distress. HEENT: [place] AT, moist mucus membranes. Trachea midline, no masses. Cardiovascular: No clubbing, cyanosis, or edema. Respiratory: Normal respiratory effort, no increased work of breathing. GI: Abdomen is soft, non tender, non distended, no abdominal masses. Liver and spleen not palpable.  No hernias appreciated.  Stool sample for occult testing is not indicated.   GU: No CVA tenderness.  No bladder fullness or masses.   Skin: No rashes, bruises or suspicious lesions. Lymph: No cervical or inguinal adenopathy. Neurologic: Grossly intact, no focal deficits, moving all 4 extremities. Psychiatric: Normal mood and affect.  Laboratory Data: Lab Results  Component Value Date   WBC 4.8 05/04/2017   HGB 14.9 05/04/2017   HCT 44.4 05/04/2017   MCV 91.5 05/04/2017   PLT 186.0 05/04/2017    Lab Results  Component Value Date   CREATININE 0.85 05/04/2017     Lab Results  Component Value Date   TSH 1.36 12/22/2016       Component Value Date/Time   CHOL 187 12/22/2016 0857   HDL 53.40 12/22/2016 0857   CHOLHDL 3 12/22/2016 0857   VLDL 15.2 12/22/2016 0857   LDLCALC 118 (H) 12/22/2016 0857    Lab Results  Component Value Date   AST 24 12/22/2016   Lab Results  Component Value Date   ALT 16 12/22/2016    Urinalysis >30 RBC's.  >10 Epithelial cells  See EPIC.    I have reviewed the labs.  Pertinent Imaging: CLINICAL DATA:  Gross hematuria x 1 1/2 months, history of kidney stone  EXAM: CT ABDOMEN AND PELVIS WITHOUT AND WITH CONTRAST  TECHNIQUE: Multidetector CT imaging of the abdomen and pelvis was performed following the standard protocol before and following the bolus administration of intravenous contrast.  CONTRAST:  163m ISOVUE-300 IOPAMIDOL (ISOVUE-300) INJECTION 61%  COMPARISON:  None.  FINDINGS: Lower chest: Mild patchy opacity in the posterior right lower  lobe, likely atelectasis.  Hepatobiliary: Liver is within normal limits.  Gallbladder is unremarkable. No intrahepatic or extrahepatic ductal dilatation.  Pancreas: Within normal limits.  Spleen: Within normal limits.  Adrenals/Urinary Tract: Adrenal glands are within normal limits.  Kidneys are within normal limits.  No enhancing renal lesions.  5 mm proximal right ureteral calculus (series 5/image 40). No hydronephrosis.  On delayed imaging, there are no filling defects in the bilateral opacified proximal collecting systems, ureters, or bladder.  Bladder is within normal limits.  Stomach/Bowel: Stomach is within normal limits.  No evidence of bowel obstruction.  Normal appendix (series 8/ image 53).  Vascular/Lymphatic: No evidence of abdominal aortic aneurysm.  No suspicious abdominopelvic lymphadenopathy.  Reproductive: Uterus is within normal limits.  Ovaries are grossly unremarkable.  Other: No abdominopelvic ascites.  Musculoskeletal: Visualized osseous structures are within normal limits.  IMPRESSION: 5 mm proximal right ureteral calculus.  No hydronephrosis.   Electronically Signed   By: SJulian HyM.D.   On: 03/30/2017 12:41   CLINICAL DATA:  Right lower quadrant abdominal pain and flank pain. Gross hematuria.  EXAM: CT ABDOMEN AND PELVIS WITHOUT CONTRAST  TECHNIQUE: Multidetector CT imaging of the abdomen and pelvis was performed following the standard protocol without IV contrast.  COMPARISON:  CT abdomen pelvis 03/30/2017  FINDINGS: Lower chest: No pulmonary nodules or pleural effusion. No visible pericardial effusion.  Hepatobiliary: Normal hepatic contours and density. No visible biliary dilatation. Normal gallbladder.  Pancreas: Normal contours without ductal dilatation. No peripancreatic fluid collection.  Spleen: Normal.  Adrenals/Urinary Tract:  --Adrenal glands: Normal.  --Right  kidney/ureter: There is a 6 x 6 x 3 mm stone at the right ureteropelvic junction. The appearance of the stone is unchanged, but it actually appears more proximal than on the prior study. There is mild hydronephrosis. No perinephric stranding. No other nephrolithiasis. The remainder of the right ureter is normal.  --Left kidney/ureter: No hydronephrosis or perinephric stranding. No nephrolithiasis. No obstructing ureteral stones.  --Urinary bladder: Unremarkable.  Stomach/Bowel:  --Stomach/Duodenum: No hiatal hernia or other gastric abnormality. Normal duodenal course and caliber.  --Small bowel: No dilatation or inflammation.  --Colon: No focal abnormality.  --Appendix: Normal.  Vascular/Lymphatic: Atherosclerotic calcification  is present within the non-aneurysmal abdominal aorta, without hemodynamically significant stenosis. No abdominal or pelvic lymphadenopathy.  Reproductive: Normal uterus and ovaries.  Musculoskeletal. No bony spinal canal stenosis or focal osseous abnormality.  Other: None.  IMPRESSION: 1. Right-sided obstructive uropathy with 6 x 6 x 3 mm ureteropelvic junction stone causing mild hydronephrosis. The stone appears slightly more proximal than on the prior study. 2. No other nephroureterolithiasis. 3. Minimal Aortic Atherosclerosis (ICD10-I70.0).   Electronically Signed   By: Ulyses Jarred M.D.   On: 05/04/2017 14:44     I have independently reviewed the films.    Assessment & Plan:    Patient with a right 6 mm UPJ stone to undergo right ureteroscopy with laser lithotripsy and ureter stent placement for definitive management.    1. Right ureteral stone  - explained to the patient that AUA Guidelines for patients with uncomplicated ureteral stones ?10 mm should be offered observation, and those with distal stones of similar size should be offered MET with ?-blockers - patient could not tolerate tamsulosin as it dropped her BP -  stone has been present since 03/30/2017  - URS is recommended, as she has protein C deficiency, would be the first lined therapy if stone(s) do not pass, but ESWL is the procedure with the least morbidity and lowest complication rate, but URS has a greater stone-free rate in a single procedure  - schedule right ureteroscopy with laser lithotripsy and ureteral stent placement  - explained to the patient how the procedure is performed and the risks involved  - informed patient that they will have a stent placed during the procedure and will remain in place after the procedure for a short time.   - stent may be removed in the office with a cystoscope or patient may be instructed to remove the stent themselves by the string  - described "stent pain" as feelings of needing to urinate/overactive bladder and a warm, tingling sensation to intense pain in the affected flank  - residual stones within the kidney or ureter may be present after the procedure and may need to have these addressed at a different encounter  - injury to the ureter is the most common intra-operative risk, it may result in an open procedure to correct the defect  - infection and bleeding are also risks  - explained the risks of general anesthesia, such as: MI, CVA, paralysis, coma and/or death.  - UA  - Urine culture - previous culture positive for beta hemolytic strep - started on clindamycin in anticipation for instrumentation  - advised to contact our office or seek treatment in the ED if becomes febrile or pain/ vomiting are difficult control in order to arrange for emergent/urgent intervention  2. Protein C deficiency  - Dr. Derrel Nip to manage her anticoagulation - INR < 1.3 for procedure - suspend coumadin and use Lovenox up until the day before the procedure if it is going to be more than 3 days - in contact with Dr. Lupita Dawn office   3. Right hydronephrosis  - obtain RUS to ensure the hydronephrosis has resolved.    4. Gross  hematuria  - UA today demonstrates > 30 RBC's  - continue to monitor the patient's UA after the treatment/passage of the stone to ensure the hematuria has resolved  - if hematuria persists, we will pursue a hematuria workup with CT Urogram and cystoscopy if appropriate.  Return for right URS/LL/ureteral stent placement.  These notes generated with voice recognition software. I  apologize for typographical errors.  Zara Council, [facility] [address] [place], [place] [address] [phone]

## 2017-05-08 ENCOUNTER — Telehealth: Payer: Self-pay | Admitting: Radiology

## 2017-05-08 ENCOUNTER — Other Ambulatory Visit: Payer: Self-pay | Admitting: Radiology

## 2017-05-08 ENCOUNTER — Ambulatory Visit (INDEPENDENT_AMBULATORY_CARE_PROVIDER_SITE_OTHER): Payer: 59 | Admitting: Urology

## 2017-05-08 ENCOUNTER — Telehealth: Payer: Self-pay | Admitting: Internal Medicine

## 2017-05-08 ENCOUNTER — Encounter: Payer: Self-pay | Admitting: Urology

## 2017-05-08 VITALS — BP 122/82 | HR 91 | Ht 59.0 in | Wt 118.1 lb

## 2017-05-08 DIAGNOSIS — R310 Gross hematuria: Secondary | ICD-10-CM

## 2017-05-08 DIAGNOSIS — N201 Calculus of ureter: Secondary | ICD-10-CM | POA: Diagnosis not present

## 2017-05-08 DIAGNOSIS — N132 Hydronephrosis with renal and ureteral calculous obstruction: Secondary | ICD-10-CM | POA: Diagnosis not present

## 2017-05-08 DIAGNOSIS — D6859 Other primary thrombophilia: Secondary | ICD-10-CM | POA: Diagnosis not present

## 2017-05-08 LAB — MICROSCOPIC EXAMINATION
BACTERIA UA: NONE SEEN
WBC UA: NONE SEEN /HPF (ref 0–?)

## 2017-05-08 LAB — URINALYSIS, COMPLETE
Bilirubin, UA: NEGATIVE
Glucose, UA: NEGATIVE
Ketones, UA: NEGATIVE
Leukocytes, UA: NEGATIVE
Nitrite, UA: NEGATIVE
PH UA: 6 (ref 5.0–7.5)
Specific Gravity, UA: >1.03 — ABNORMAL HIGH (ref 1.005–1.030)
UUROB: 0.2 mg/dL (ref 0.2–1.0)

## 2017-05-08 MED ORDER — ENOXAPARIN SODIUM 60 MG/0.6ML ~~LOC~~ SOLN: 54.0000 mg | Freq: Two times a day (BID) | SUBCUTANEOUS | 0 refills | Status: DC

## 2017-05-08 MED ORDER — CLINDAMYCIN HCL 300 MG PO CAPS: 300.0000 mg | ORAL_CAPSULE | Freq: Three times a day (TID) | ORAL | 0 refills | Status: DC

## 2017-05-08 NOTE — Telephone Encounter (Signed)
I would like her to start giving herself Lovenox injections on Friday morning,  It has to be given Every 12 hours, so she needs to pick  A convenient time (8AM/8PM, etc)  And her last injection will be the night before her procedure,so  Monday night will be her last dose.  The dose should be given at least 12 hours prior to the time of the procedure , so that it is out of her system by the time she has the procedure,  So if she is scheduled for Tuesday at 8 am ,  Her last dose of lovenox should be given by 7 pm Monday evening.    If she needs a lesson on how to give herself a subQ injection,  Please schedule her a RN visit this week to do so.  I will send the rx ofor the Lovenox syringes to her pharmacy

## 2017-05-08 NOTE — Telephone Encounter (Signed)
Pt scheduled for right URS with Dr Apolinar Junes on 05/15/17. Made pt aware of surgery date, pre-admit testing appt & to call day prior to surgery for arrival time to [facility]. Advised pt that per Dr Darrick Huntsman, pt will be contacted to stop warfarin & begin lovenox prior to surgery. Questions answered. Pt voices understanding.

## 2017-05-08 NOTE — Telephone Encounter (Signed)
Spoke with pt and she stated that her procedure is scheduled for 05/22/2017. Told the pt to go ahead and stop taking the coumadin and we would contact her if she needed to start back on it or the lovenox.

## 2017-05-08 NOTE — Telephone Encounter (Signed)
Not sure that date is correct;  Got a message from Nellie mcgowan that it is set for august 28

## 2017-05-08 NOTE — Telephone Encounter (Signed)
Tell patient to suspend her coumadin starting tonight.  I will contact her if she needs to start Lovenox,  I do not know when her procedure is pLANNED.

## 2017-05-08 NOTE — Telephone Encounter (Signed)
Spoke with pt and she stated that the surgery is scheduled for 05/15/2017.

## 2017-05-09 ENCOUNTER — Other Ambulatory Visit: Payer: Self-pay | Admitting: Radiology

## 2017-05-09 DIAGNOSIS — N201 Calculus of ureter: Secondary | ICD-10-CM

## 2017-05-09 NOTE — Telephone Encounter (Signed)
Spoke with pt and informed her of the message below. The pt gave a verbal understanding. Pt also stated that she has given herself the lovenox injections before so she didn't think that she would need a nurse visit. Also let the pt know that if she had any questions to feel free to give [place] a call back.

## 2017-05-11 ENCOUNTER — Encounter
Admission: RE | Admit: 2017-05-11 | Discharge: 2017-05-11 | Disposition: A | Discharge status: Home / Self Care | Payer: 59 | Source: Ambulatory Visit | Attending: Urology | Admitting: Urology

## 2017-05-11 ENCOUNTER — Telehealth: Payer: Self-pay

## 2017-05-11 DIAGNOSIS — N201 Calculus of ureter: Secondary | ICD-10-CM | POA: Diagnosis not present

## 2017-05-11 DIAGNOSIS — Z01818 Encounter for other preprocedural examination: Secondary | ICD-10-CM | POA: Diagnosis not present

## 2017-05-11 HISTORY — DX: Personal history of urinary calculi: Z87.442

## 2017-05-11 LAB — CULTURE, URINE COMPREHENSIVE

## 2017-05-11 NOTE — Telephone Encounter (Signed)
-----   Message from Harle Battiest, PA-C sent at 05/11/2017  2:30 PM EDT ----- Please let Gregg know that her urine culture was positive.  She should be taking Clindamycin at this time.

## 2017-05-11 NOTE — Telephone Encounter (Signed)
Spoke with pt in reference to ucx results and abx. Pt voiced understanding. 

## 2017-05-11 NOTE — Patient Instructions (Signed)
Your procedure is scheduled on: May 15, 2017 Report to Same Day Surgery on the 2nd floor in the [facility]. To find out your arrival time, please call [phone] between 1PM - 3PM on:May 14, 2017  REMEMBER: Instructions that are not followed completely may result in serious medical risk up to and including death; or upon the discretion of your surgeon and anesthesiologist your surgery may need to be rescheduled.  Do not eat food or drink liquids after midnight. No gum chewing or hard candies (UP TO 40Z OF CLEAR LIQUID UP TO 2 HOURS BEFORE ARRIVAL TIME)  No Alcohol for 24 hours before or after surgery.  No Smoking for 24 hours prior to surgery.  Notify your doctor if there is any change in your medical condition (cold, fever, infection).  Do not wear jewelry, make-up, hairpins, clips or nail polish.  Do not wear lotions, powders, or perfumes.   Do not shave 48 hours prior to surgery. Men may shave face and neck.  Contacts and dentures may not be worn into surgery.  Do not bring valuables to the hospital. [facility] is not responsible for any belongings or valuables.   TAKE THESE MEDICATIONS THE MORNING OF SURGERY WITH A SIP OF WATER: NONE   Follow recommendations from Cardiologist, Pulmonologist or PCP regarding stopping Aspirin, Coumadin, Plavix, Eliquis, Pradaxa, or Pletal. PT INSTRUCTED BY DR Darrick Huntsman ON LOVENOX BRIDGE AND NOT USING COUMADIN  Stop Anti-inflammatories such as Advil, Aleve, Ibuprofen, Motrin, Naproxen, Naprosyn, Goodie powder, or aspirin products. (May take Tylenol or Acetaminophen and Celebrex if needed.)  If you are being admitted to the hospital overnight, leave your suitcase in the car. After surgery it may be brought to your room.  If you are being discharged the day of surgery, you will not be allowed to drive home. You will need someone to drive you home and stay with you that night.   If you are taking public transportation, you will need to  have a responsible adult to with you.

## 2017-05-15 ENCOUNTER — Ambulatory Visit: Payer: 59 | Admitting: Anesthesiology

## 2017-05-15 ENCOUNTER — Telehealth: Payer: Self-pay | Admitting: Urology

## 2017-05-15 ENCOUNTER — Encounter: Payer: Self-pay | Admitting: *Deleted

## 2017-05-15 ENCOUNTER — Other Ambulatory Visit: Payer: Self-pay | Admitting: Urology

## 2017-05-15 ENCOUNTER — Encounter
Admission: RE | Disposition: A | Discharge status: Home / Self Care | Payer: Self-pay | Source: Ambulatory Visit | Attending: Urology

## 2017-05-15 ENCOUNTER — Ambulatory Visit
Admission: RE | Admit: 2017-05-15 | Discharge: 2017-05-15 | Disposition: A | Discharge status: Home / Self Care | Payer: 59 | Source: Ambulatory Visit | Attending: Urology | Admitting: Urology

## 2017-05-15 DIAGNOSIS — Z87442 Personal history of urinary calculi: Secondary | ICD-10-CM | POA: Diagnosis not present

## 2017-05-15 DIAGNOSIS — I700 Atherosclerosis of aorta: Secondary | ICD-10-CM | POA: Insufficient documentation

## 2017-05-15 DIAGNOSIS — N132 Hydronephrosis with renal and ureteral calculous obstruction: Secondary | ICD-10-CM | POA: Insufficient documentation

## 2017-05-15 DIAGNOSIS — F419 Anxiety disorder, unspecified: Secondary | ICD-10-CM | POA: Diagnosis not present

## 2017-05-15 DIAGNOSIS — Z7901 Long term (current) use of anticoagulants: Secondary | ICD-10-CM | POA: Insufficient documentation

## 2017-05-15 DIAGNOSIS — D6859 Other primary thrombophilia: Secondary | ICD-10-CM | POA: Insufficient documentation

## 2017-05-15 DIAGNOSIS — Z882 Allergy status to sulfonamides status: Secondary | ICD-10-CM | POA: Insufficient documentation

## 2017-05-15 DIAGNOSIS — Z880 Allergy status to penicillin: Secondary | ICD-10-CM | POA: Insufficient documentation

## 2017-05-15 DIAGNOSIS — N201 Calculus of ureter: Secondary | ICD-10-CM

## 2017-05-15 DIAGNOSIS — Z86718 Personal history of other venous thrombosis and embolism: Secondary | ICD-10-CM | POA: Diagnosis not present

## 2017-05-15 HISTORY — PX: CYSTOSCOPY/URETEROSCOPY/HOLMIUM LASER/STENT PLACEMENT: SHX6546

## 2017-05-15 LAB — POCT PREGNANCY, URINE: Preg Test, Ur: NEGATIVE

## 2017-05-15 SURGERY — CYSTOSCOPY/URETEROSCOPY/HOLMIUM LASER/STENT PLACEMENT
Anesthesia: General | Laterality: Right

## 2017-05-15 MED ORDER — ONDANSETRON HCL 4 MG/2ML IJ SOLN
4.0000 mg | Freq: Once | INTRAMUSCULAR | Status: AC | PRN
  Administered 2017-05-15: 4 mg via INTRAVENOUS

## 2017-05-15 MED ORDER — SUGAMMADEX SODIUM 200 MG/2ML IV SOLN
INTRAVENOUS | Status: DC | PRN
  Administered 2017-05-15: 110 mg via INTRAVENOUS

## 2017-05-15 MED ORDER — KETOROLAC TROMETHAMINE 30 MG/ML IJ SOLN
INTRAMUSCULAR | Status: AC
  Filled 2017-05-15: qty 1

## 2017-05-15 MED ORDER — ROCURONIUM BROMIDE 50 MG/5ML IV SOLN
INTRAVENOUS | Status: AC
  Filled 2017-05-15: qty 1

## 2017-05-15 MED ORDER — CIPROFLOXACIN IN D5W 400 MG/200ML IV SOLN
INTRAVENOUS | Status: AC
  Filled 2017-05-15: qty 200

## 2017-05-15 MED ORDER — HYDROCODONE-ACETAMINOPHEN 10-325 MG PO TABS
ORAL_TABLET | ORAL | Status: AC
  Filled 2017-05-15: qty 1

## 2017-05-15 MED ORDER — CIPROFLOXACIN IN D5W 400 MG/200ML IV SOLN
400.0000 mg | INTRAVENOUS | Status: AC
  Administered 2017-05-15: 400 mg via INTRAVENOUS

## 2017-05-15 MED ORDER — FENTANYL CITRATE (PF) 100 MCG/2ML IJ SOLN
25.0000 ug | INTRAMUSCULAR | Status: DC | PRN
  Administered 2017-05-15 (×4): 25 ug via INTRAVENOUS

## 2017-05-15 MED ORDER — SCOPOLAMINE 1 MG/3DAYS TD PT72
1.0000 | MEDICATED_PATCH | TRANSDERMAL | Status: DC
  Administered 2017-05-15: 1.5 mg via TRANSDERMAL

## 2017-05-15 MED ORDER — PROMETHAZINE HCL 25 MG/ML IJ SOLN
6.2500 mg | Freq: Once | INTRAMUSCULAR | Status: AC
  Administered 2017-05-15: 6.25 mg via INTRAVENOUS

## 2017-05-15 MED ORDER — SCOPOLAMINE 1 MG/3DAYS TD PT72
MEDICATED_PATCH | TRANSDERMAL | Status: AC
  Filled 2017-05-15: qty 1

## 2017-05-15 MED ORDER — OXYBUTYNIN CHLORIDE 5 MG PO TABS
ORAL_TABLET | ORAL | Status: AC
  Filled 2017-05-15: qty 1

## 2017-05-15 MED ORDER — LIDOCAINE HCL (PF) 2 % IJ SOLN
INTRAMUSCULAR | Status: AC
  Filled 2017-05-15: qty 2

## 2017-05-15 MED ORDER — OXYBUTYNIN CHLORIDE 5 MG PO TABS
5.0000 mg | ORAL_TABLET | Freq: Three times a day (TID) | ORAL | Status: DC | PRN
  Administered 2017-05-15: 5 mg via ORAL
  Filled 2017-05-15: qty 1

## 2017-05-15 MED ORDER — LACTATED RINGERS IV SOLN
INTRAVENOUS | Status: DC
  Administered 2017-05-15 (×2): via INTRAVENOUS

## 2017-05-15 MED ORDER — ONDANSETRON HCL 4 MG/2ML IJ SOLN
INTRAMUSCULAR | Status: DC | PRN
  Administered 2017-05-15: 4 mg via INTRAVENOUS

## 2017-05-15 MED ORDER — MIDAZOLAM HCL 2 MG/2ML IJ SOLN
INTRAMUSCULAR | Status: DC | PRN
  Administered 2017-05-15: 2 mg via INTRAVENOUS

## 2017-05-15 MED ORDER — ONDANSETRON HCL 4 MG/2ML IJ SOLN
INTRAMUSCULAR | Status: AC
  Filled 2017-05-15: qty 2

## 2017-05-15 MED ORDER — SODIUM CHLORIDE 0.9 % IJ SOLN
INTRAMUSCULAR | Status: AC
  Filled 2017-05-15: qty 10

## 2017-05-15 MED ORDER — PROPOFOL 10 MG/ML IV BOLUS
INTRAVENOUS | Status: AC
  Filled 2017-05-15: qty 20

## 2017-05-15 MED ORDER — DEXAMETHASONE SODIUM PHOSPHATE 10 MG/ML IJ SOLN
INTRAMUSCULAR | Status: AC
  Filled 2017-05-15: qty 1

## 2017-05-15 MED ORDER — FAMOTIDINE 20 MG PO TABS
20.0000 mg | ORAL_TABLET | Freq: Once | ORAL | Status: AC
  Administered 2017-05-15: 20 mg via ORAL

## 2017-05-15 MED ORDER — MIDAZOLAM HCL 2 MG/2ML IJ SOLN
INTRAMUSCULAR | Status: AC
  Filled 2017-05-15: qty 2

## 2017-05-15 MED ORDER — PHENYLEPHRINE HCL 10 MG/ML IJ SOLN
INTRAMUSCULAR | Status: DC | PRN
  Administered 2017-05-15: 100 ug via INTRAVENOUS

## 2017-05-15 MED ORDER — HYDROCODONE-ACETAMINOPHEN 10-325 MG PO TABS
1.0000 | ORAL_TABLET | Freq: Four times a day (QID) | ORAL | Status: DC | PRN
  Administered 2017-05-15: 1 via ORAL
  Filled 2017-05-15: qty 1

## 2017-05-15 MED ORDER — PROPOFOL 10 MG/ML IV BOLUS
INTRAVENOUS | Status: DC | PRN
  Administered 2017-05-15: 110 mg via INTRAVENOUS

## 2017-05-15 MED ORDER — DOCUSATE SODIUM 100 MG PO CAPS: 100.0000 mg | ORAL_CAPSULE | Freq: Two times a day (BID) | ORAL | 0 refills | Status: DC

## 2017-05-15 MED ORDER — FENTANYL CITRATE (PF) 100 MCG/2ML IJ SOLN
INTRAMUSCULAR | Status: AC
  Filled 2017-05-15: qty 2

## 2017-05-15 MED ORDER — HYDROCODONE-ACETAMINOPHEN 10-325 MG PO TABS: 1.0000 | ORAL_TABLET | Freq: Four times a day (QID) | ORAL | 0 refills | Status: DC | PRN

## 2017-05-15 MED ORDER — OXYBUTYNIN CHLORIDE 5 MG PO TABS: 5.0000 mg | ORAL_TABLET | Freq: Three times a day (TID) | ORAL | 0 refills | Status: DC | PRN

## 2017-05-15 MED ORDER — FENTANYL CITRATE (PF) 100 MCG/2ML IJ SOLN
INTRAMUSCULAR | Status: DC | PRN
Start: 2017-05-15 — End: 2017-05-15
  Administered 2017-05-15 (×2): 50 ug via INTRAVENOUS

## 2017-05-15 MED ORDER — ONDANSETRON HCL 4 MG/2ML IJ SOLN
INTRAMUSCULAR | Status: AC
Start: 2017-05-15 — End: 2017-05-15
  Filled 2017-05-15: qty 2

## 2017-05-15 MED ORDER — FAMOTIDINE 20 MG PO TABS
ORAL_TABLET | ORAL | Status: AC
  Administered 2017-05-15: 20 mg via ORAL
  Filled 2017-05-15: qty 1

## 2017-05-15 MED ORDER — LIDOCAINE HCL (CARDIAC) 20 MG/ML IV SOLN
INTRAVENOUS | Status: DC | PRN
  Administered 2017-05-15: 60 mg via INTRAVENOUS

## 2017-05-15 MED ORDER — PROMETHAZINE HCL 25 MG/ML IJ SOLN
INTRAMUSCULAR | Status: AC
  Filled 2017-05-15: qty 1

## 2017-05-15 MED ORDER — DEXAMETHASONE SODIUM PHOSPHATE 10 MG/ML IJ SOLN
INTRAMUSCULAR | Status: DC | PRN
  Administered 2017-05-15: 10 mg via INTRAVENOUS

## 2017-05-15 MED ORDER — KETOROLAC TROMETHAMINE 30 MG/ML IJ SOLN
30.0000 mg | Freq: Once | INTRAMUSCULAR | Status: AC
  Administered 2017-05-15: 30 mg via INTRAVENOUS

## 2017-05-15 MED ORDER — ROCURONIUM BROMIDE 100 MG/10ML IV SOLN
INTRAVENOUS | Status: DC | PRN
  Administered 2017-05-15: 30 mg via INTRAVENOUS

## 2017-05-15 SURGICAL SUPPLY — 31 items
BAG DRAIN CYSTO-URO LG1000N (MISCELLANEOUS) ×3 IMPLANT
BASKET ZERO TIP 1.9FR (BASKET) IMPLANT
BRUSH SCRUB EZ 1% IODOPHOR (MISCELLANEOUS) ×3 IMPLANT
CATH URETL 5X70 OPEN END (CATHETERS) ×3 IMPLANT
CNTNR SPEC 2.5X3XGRAD LEK (MISCELLANEOUS)
CONRAY 43 FOR UROLOGY 50M (MISCELLANEOUS) ×3 IMPLANT
CONT SPEC 4OZ STER OR WHT (MISCELLANEOUS)
CONTAINER SPEC 2.5X3XGRAD LEK (MISCELLANEOUS) IMPLANT
DRAPE UTILITY 15X26 TOWEL STRL (DRAPES) ×3 IMPLANT
FEE TECHNICIAN ONLY PER HOUR (MISCELLANEOUS) ×3 IMPLANT
FIBER LASER LITHO 273 (Laser) IMPLANT
GLOVE BIO SURGEON STRL SZ 6.5 (GLOVE) ×2 IMPLANT
GLOVE BIO SURGEONS STRL SZ 6.5 (GLOVE) ×1
GOWN STRL REUS W/ TWL LRG LVL3 (GOWN DISPOSABLE) ×2 IMPLANT
GOWN STRL REUS W/TWL LRG LVL3 (GOWN DISPOSABLE) ×4
GUIDEWIRE GREEN .038 145CM (MISCELLANEOUS) ×3 IMPLANT
INFUSOR MANOMETER BAG 3000ML (MISCELLANEOUS) ×3 IMPLANT
INTRODUCER DILATOR DOUBLE (INTRODUCER) ×3 IMPLANT
KIT RM TURNOVER CYSTO AR (KITS) ×3 IMPLANT
PACK CYSTO AR (MISCELLANEOUS) ×3 IMPLANT
SENSORWIRE 0.038 NOT ANGLED (WIRE) ×6
SET CYSTO W/LG BORE CLAMP LF (SET/KITS/TRAYS/PACK) ×3 IMPLANT
SET DILATOR URETRAL 8.5FR (MISCELLANEOUS) ×3 IMPLANT
SHEATH URETERAL 12FRX35CM (MISCELLANEOUS) ×3 IMPLANT
SOL .9 NS 3000ML IRR  AL (IV SOLUTION) ×2
SOL .9 NS 3000ML IRR UROMATIC (IV SOLUTION) ×1 IMPLANT
STENT URET 6FRX24 CONTOUR (STENTS) IMPLANT
STENT URET 6FRX26 CONTOUR (STENTS) IMPLANT
SURGILUBE 2OZ TUBE FLIPTOP (MISCELLANEOUS) ×3 IMPLANT
WATER STERILE IRR 1000ML POUR (IV SOLUTION) ×3 IMPLANT
WIRE SENSOR 0.038 NOT ANGLED (WIRE) ×2 IMPLANT

## 2017-05-15 NOTE — OR Nursing (Signed)
zofran given for c/o nausea.

## 2017-05-15 NOTE — H&P (View-Only) (Signed)
05/08/2017 8:44 AM   Sabrina Woodard 1976/02/08 [id number]  Referring provider: Crecencio Mc, MD [place], [place] [address]  Chief Complaint  Patient presents with  . New Patient (Initial Visit)    urethral stone referred by Dr. Derrel Nip    HPI: Patient is a 41 year old Caucasian female who is referred by Dr. Derrel Nip for nephrolithiasis.  She states that she started experiencing gross hematuria with clots that started in June. She then started feeling fatigued with occasional crampy right-sided flank pain.   Contrast CT performed on 03/30/2017 noted a 5 mm proximal right ureteral calculus.  No hydronephrosis.  She proceeded with MET with tamsulosin.    Unfortunately, she could not tolerate the tamsulosin due to hypotension.  A CT Renal stone study noted a right-sided obstructive uropathy with 6 x 6 x 3 mm ureteropelvic junction stone causing mild hydronephrosis. The stone appears slightly more proximal than on the prior study.  No other nephroureterolithiasis.  Minimal Aortic Atherosclerosis (ICD10-I70.0).  She was then referred to [place].  Today, she still has not passed a fragment. She has been experiencing urgency, nocturia and blood in the urine. She is also still experiencing intermittent right-sided flank pain associated with nausea and gross hematuria.  She is not experiencing fevers or chills.  The pain is a 6-7/10.  Nothing helps the pain.  Nothing makes it worse.    WBC count was 4.8.  Creatinine was 0.85.  Urine culture was positive for beta hemolytic strep.    UA positive for > 30 RBC's.    She does have a prior history of stones.      Reviewed referral notes - see above  PMH: Past Medical History:  Diagnosis Date  . Anxiety   . Chronic kidney disease 2014   kidney stones  . 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 DVT (deep venous thrombosis) ([facility])   . Pneumothorax     Surgical History: Past Surgical History:  Procedure  Laterality Date  . TONSILLECTOMY AND ADENOIDECTOMY Bilateral 1995    Home Medications:  Allergies as of 05/08/2017      Reactions   Penicillins Anaphylaxis   Sulfa Antibiotics Rash      Medication List       Accurate as of 05/08/17 11:59 PM. Always use your most recent med list.          celecoxib 200 MG capsule Commonly known as:  CELEBREX Take 1 capsule (200 mg total) by mouth 2 (two) times daily.   ciprofloxacin 250 MG tablet Commonly known as:  CIPRO Take 1 tablet (250 mg total) by mouth 2 (two) times daily.   clindamycin 300 MG capsule Commonly known as:  CLEOCIN Take 1 capsule (300 mg total) by mouth 3 (three) times daily.   enoxaparin 60 MG/0.6ML injection Commonly known as:  LOVENOX Inject 0.55 mLs (55 mg total) into the skin every 12 (twelve) hours.   HYDROcodone-acetaminophen 10-325 MG tablet Commonly known as:  NORCO Take 1 tablet by mouth every 6 (six) hours as needed. For kidney stone   ondansetron 4 MG disintegrating tablet Commonly known as:  ZOFRAN ODT Take 1 tablet (4 mg total) by mouth every 8 (eight) hours as needed for nausea or vomiting.   warfarin 5 MG tablet Commonly known as:  COUMADIN Take 1 tablet (5 mg total) by mouth daily at 6 PM.   warfarin 3 MG tablet Commonly known as:  COUMADIN  TAKE WITH 5 MG FOR TOTAL DAILY DOSE OF 8 MG   warfarin 2 MG tablet Commonly known as:  COUMADIN TAKE 1 TABLET DAILY ALONG WITH 5 MG TABLET FOR TOTAL DAILY DOSE 7 MG            Discharge Care Instructions        Start     Ordered   05/08/17 0000  Urinalysis, Complete     05/08/17 1327   05/08/17 0000  CULTURE, URINE COMPREHENSIVE     05/08/17 1327   05/08/17 0000  clindamycin (CLEOCIN) 300 MG capsule  3 times daily    Question:  Supervising Provider  Answer:  Hollice Espy   05/08/17 1637   05/08/17 0000  Microscopic Examination     05/08/17 0000      Allergies:  Allergies  Allergen Reactions  . Penicillins Anaphylaxis  . Sulfa  Antibiotics Rash    Family History: Family History  Problem Relation Age of Onset  . Hyperlipidemia Mother   . Hypertension Mother   . Hyperlipidemia Father   . Hypertension Father   . Prostate cancer Father 37       prostate   . Stroke Maternal Grandfather   . Cancer Paternal Grandmother 98       breast ca   . Breast cancer Paternal Grandmother   . COPD Paternal Grandfather   . Kidney cancer Neg Hx   . Bladder Cancer Neg Hx     Social History:  reports that she has never smoked. She has never used smokeless tobacco. She reports that she does not drink alcohol or use drugs.  ROS: UROLOGY Frequent Urination?: No Hard to postpone urination?: Yes Burning/pain with urination?: No Get up at night to urinate?: Yes Leakage of urine?: No Urine stream starts and stops?: No Trouble starting stream?: No Do you have to strain to urinate?: No Blood in urine?: Yes Urinary tract infection?: No Sexually transmitted disease?: No Injury to kidneys or bladder?: No Painful intercourse?: Yes Weak stream?: No Currently pregnant?: No Vaginal bleeding?: No Last menstrual period?: 04/29/2017  Gastrointestinal Nausea?: Yes Vomiting?: No Indigestion/heartburn?: No Diarrhea?: Yes Constipation?: No  Constitutional Fever: No Night sweats?: No Weight loss?: No Fatigue?: Yes  Skin Skin rash/lesions?: No Itching?: No  Eyes Blurred vision?: No Double vision?: No  Ears/Nose/Throat Sore throat?: No Sinus problems?: No  Hematologic/Lymphatic Swollen glands?: No Easy bruising?: No  Cardiovascular Leg swelling?: No Chest pain?: No  Respiratory Cough?: No Shortness of breath?: No  Endocrine Excessive thirst?: No  Musculoskeletal Back pain?: Yes Joint pain?: No  Neurological Headaches?: Yes Dizziness?: No  Psychologic Depression?: No Anxiety?: Yes  Physical Exam: BP 122/82   Pulse 91   Ht _0  (1.499 m)   Wt 118 lb 1.6 oz (53.6 kg)   LMP 04/29/2017   BMI  23.85 kg/m   Constitutional: Well nourished. Alert and oriented, No acute distress. HEENT: [place] AT, moist mucus membranes. Trachea midline, no masses. Cardiovascular: No clubbing, cyanosis, or edema. Respiratory: Normal respiratory effort, no increased work of breathing. GI: Abdomen is soft, non tender, non distended, no abdominal masses. Liver and spleen not palpable.  No hernias appreciated.  Stool sample for occult testing is not indicated.   GU: No CVA tenderness.  No bladder fullness or masses.   Skin: No rashes, bruises or suspicious lesions. Lymph: No cervical or inguinal adenopathy. Neurologic: Grossly intact, no focal deficits, moving all 4 extremities. Psychiatric: Normal mood and affect.  Laboratory Data: Lab Results  Component Value Date   WBC 4.8 05/04/2017   HGB 14.9 05/04/2017   HCT 44.4 05/04/2017   MCV 91.5 05/04/2017   PLT 186.0 05/04/2017    Lab Results  Component Value Date   CREATININE 0.85 05/04/2017     Lab Results  Component Value Date   TSH 1.36 12/22/2016       Component Value Date/Time   CHOL 187 12/22/2016 0857   HDL 53.40 12/22/2016 0857   CHOLHDL 3 12/22/2016 0857   VLDL 15.2 12/22/2016 0857   LDLCALC 118 (H) 12/22/2016 0857    Lab Results  Component Value Date   AST 24 12/22/2016   Lab Results  Component Value Date   ALT 16 12/22/2016    Urinalysis >30 RBC's.  >10 Epithelial cells  See EPIC.    I have reviewed the labs.  Pertinent Imaging: CLINICAL DATA:  Gross hematuria x 1 1/2 months, history of kidney stone  EXAM: CT ABDOMEN AND PELVIS WITHOUT AND WITH CONTRAST  TECHNIQUE: Multidetector CT imaging of the abdomen and pelvis was performed following the standard protocol before and following the bolus administration of intravenous contrast.  CONTRAST:  163m ISOVUE-300 IOPAMIDOL (ISOVUE-300) INJECTION 61%  COMPARISON:  None.  FINDINGS: Lower chest: Mild patchy opacity in the posterior right lower  lobe, likely atelectasis.  Hepatobiliary: Liver is within normal limits.  Gallbladder is unremarkable. No intrahepatic or extrahepatic ductal dilatation.  Pancreas: Within normal limits.  Spleen: Within normal limits.  Adrenals/Urinary Tract: Adrenal glands are within normal limits.  Kidneys are within normal limits.  No enhancing renal lesions.  5 mm proximal right ureteral calculus (series 5/image 40). No hydronephrosis.  On delayed imaging, there are no filling defects in the bilateral opacified proximal collecting systems, ureters, or bladder.  Bladder is within normal limits.  Stomach/Bowel: Stomach is within normal limits.  No evidence of bowel obstruction.  Normal appendix (series 8/ image 53).  Vascular/Lymphatic: No evidence of abdominal aortic aneurysm.  No suspicious abdominopelvic lymphadenopathy.  Reproductive: Uterus is within normal limits.  Ovaries are grossly unremarkable.  Other: No abdominopelvic ascites.  Musculoskeletal: Visualized osseous structures are within normal limits.  IMPRESSION: 5 mm proximal right ureteral calculus.  No hydronephrosis.   Electronically Signed   By: SJulian HyM.D.   On: 03/30/2017 12:41   CLINICAL DATA:  Right lower quadrant abdominal pain and flank pain. Gross hematuria.  EXAM: CT ABDOMEN AND PELVIS WITHOUT CONTRAST  TECHNIQUE: Multidetector CT imaging of the abdomen and pelvis was performed following the standard protocol without IV contrast.  COMPARISON:  CT abdomen pelvis 03/30/2017  FINDINGS: Lower chest: No pulmonary nodules or pleural effusion. No visible pericardial effusion.  Hepatobiliary: Normal hepatic contours and density. No visible biliary dilatation. Normal gallbladder.  Pancreas: Normal contours without ductal dilatation. No peripancreatic fluid collection.  Spleen: Normal.  Adrenals/Urinary Tract:  --Adrenal glands: Normal.  --Right  kidney/ureter: There is a 6 x 6 x 3 mm stone at the right ureteropelvic junction. The appearance of the stone is unchanged, but it actually appears more proximal than on the prior study. There is mild hydronephrosis. No perinephric stranding. No other nephrolithiasis. The remainder of the right ureter is normal.  --Left kidney/ureter: No hydronephrosis or perinephric stranding. No nephrolithiasis. No obstructing ureteral stones.  --Urinary bladder: Unremarkable.  Stomach/Bowel:  --Stomach/Duodenum: No hiatal hernia or other gastric abnormality. Normal duodenal course and caliber.  --Small bowel: No dilatation or inflammation.  --Colon: No focal abnormality.  --Appendix: Normal.  Vascular/Lymphatic: Atherosclerotic calcification  is present within the non-aneurysmal abdominal aorta, without hemodynamically significant stenosis. No abdominal or pelvic lymphadenopathy.  Reproductive: Normal uterus and ovaries.  Musculoskeletal. No bony spinal canal stenosis or focal osseous abnormality.  Other: None.  IMPRESSION: 1. Right-sided obstructive uropathy with 6 x 6 x 3 mm ureteropelvic junction stone causing mild hydronephrosis. The stone appears slightly more proximal than on the prior study. 2. No other nephroureterolithiasis. 3. Minimal Aortic Atherosclerosis (ICD10-I70.0).   Electronically Signed   By: Ulyses Jarred M.D.   On: 05/04/2017 14:44     I have independently reviewed the films.    Assessment & Plan:    Patient with a right 6 mm UPJ stone to undergo right ureteroscopy with laser lithotripsy and ureter stent placement for definitive management.    1. Right ureteral stone  - explained to the patient that AUA Guidelines for patients with uncomplicated ureteral stones ?10 mm should be offered observation, and those with distal stones of similar size should be offered MET with ?-blockers - patient could not tolerate tamsulosin as it dropped her BP -  stone has been present since 03/30/2017  - URS is recommended, as she has protein C deficiency, would be the first lined therapy if stone(s) do not pass, but ESWL is the procedure with the least morbidity and lowest complication rate, but URS has a greater stone-free rate in a single procedure  - schedule right ureteroscopy with laser lithotripsy and ureteral stent placement  - explained to the patient how the procedure is performed and the risks involved  - informed patient that they will have a stent placed during the procedure and will remain in place after the procedure for a short time.   - stent may be removed in the office with a cystoscope or patient may be instructed to remove the stent themselves by the string  - described "stent pain" as feelings of needing to urinate/overactive bladder and a warm, tingling sensation to intense pain in the affected flank  - residual stones within the kidney or ureter may be present after the procedure and may need to have these addressed at a different encounter  - injury to the ureter is the most common intra-operative risk, it may result in an open procedure to correct the defect  - infection and bleeding are also risks  - explained the risks of general anesthesia, such as: MI, CVA, paralysis, coma and/or death.  - UA  - Urine culture - previous culture positive for beta hemolytic strep - started on clindamycin in anticipation for instrumentation  - advised to contact our office or seek treatment in the ED if becomes febrile or pain/ vomiting are difficult control in order to arrange for emergent/urgent intervention  2. Protein C deficiency  - Dr. Derrel Nip to manage her anticoagulation - INR < 1.3 for procedure - suspend coumadin and use Lovenox up until the day before the procedure if it is going to be more than 3 days - in contact with Dr. Lupita Dawn office   3. Right hydronephrosis  - obtain RUS to ensure the hydronephrosis has resolved.    4. Gross  hematuria  - UA today demonstrates > 30 RBC's  - continue to monitor the patient's UA after the treatment/passage of the stone to ensure the hematuria has resolved  - if hematuria persists, we will pursue a hematuria workup with CT Urogram and cystoscopy if appropriate.  Return for right URS/LL/ureteral stent placement.  These notes generated with voice recognition software. I  apologize for typographical errors.  Zara Council, [facility] [address] [place], [place] [address] [phone]

## 2017-05-15 NOTE — OR Nursing (Signed)
Discussed nausea continuation and pain with Dr. Noralyn Pick.  2nd dose phenergan given and scopolamine patch applied as ordered.

## 2017-05-15 NOTE — OR Nursing (Signed)
Pt awakened for assmt.  Advises pain and nausea improved (per flowsheet).  Reviewed discharge instructions with patient and mom.  Mom advises Dr. Apolinar Junes instructed for stent to be removed on Tuesday after the holiday weekend.  Will d/c to home when more alert.

## 2017-05-15 NOTE — Discharge Instructions (Addendum)
You have a ureteral stent in place.  This is a tube that extends from your kidney to your bladder.  This may cause urinary bleeding, burning with urination, and urinary frequency.  Please call our office or present to the ED if you develop fevers >101 or pain which is not able to be controlled with oral pain medications.  You may be given either Flomax and/ or ditropan to help with bladder spasms and stent pain in addition to pain medications.    Your stent is on a string which is attached to your left inner thigh.  Ideally, this would remain in place for a week. On the day of removal, and tape and pulled gently until the entire stent is removed.  You may resume your anticoagulation. You should take Lovenox tonight and resume Coumadin tomorrow.    AMBULATORY SURGERY  DISCHARGE INSTRUCTIONS   1) The drugs that you were given will stay in your system until tomorrow so for the next 24 hours you should not:  A) Drive an automobile B) Make any legal decisions C) Drink any alcoholic beverage   2) You may resume regular meals tomorrow.  Today it is better to start with liquids and gradually work up to solid foods.  You may eat anything you prefer, but it is better to start with liquids, then soup and crackers, and gradually work up to solid foods.   3) Please notify your doctor immediately if you have any unusual bleeding, trouble breathing, redness and pain at the surgery site, drainage, fever, or pain not relieved by medication.    4) Additional Instructions:        Please contact your physician with any problems or Same Day Surgery at [phone], Monday through Friday 6 am to 4 pm, or [place] at [facility] number at [phone].  It is normal to have blood in her urine the whole time the stent is in place. Please drink plently of of fluids to keep clots from forming.  [facility] [address], [address] [place], [place] [address] [phone]   AMBULATORY SURGERY  DISCHARGE INSTRUCTIONS   5) The drugs that you were given will stay in your system until tomorrow so for the next 24 hours you should not:  D) Drive an automobile E) Make any legal decisions F) Drink any alcoholic beverage   6) You may resume regular meals tomorrow.  Today it is better to start with liquids and gradually work up to solid foods.  You may eat anything you prefer, but it is better to start with liquids, then soup and crackers, and gradually work up to solid foods.   7) Please notify your doctor immediately if you have any unusual bleeding, trouble breathing, redness and pain at the surgery site, drainage, fever, or pain not relieved by medication.    8) Additional Instructions:        Please contact your physician with any problems or Same Day Surgery at [phone], Monday through Friday 6 am to 4 pm, or [place] at [facility] number at [phone].

## 2017-05-15 NOTE — Interval H&P Note (Signed)
History and Physical Interval Note:  05/15/2017 8:57 AM  Sabrina Woodard  has presented today for surgery, with the diagnosis of right ureteral stone  The various methods of treatment have been discussed with the patient and family. After consideration of risks, benefits and other options for treatment, the patient has consented to  Procedure(s): CYSTOSCOPY/URETEROSCOPY/HOLMIUM LASER/STENT PLACEMENT (Right) as a surgical intervention .  The patient's history has been reviewed, patient examined, no change in status, stable for surgery.  I have reviewed the patient's chart and labs.  Questions were answered to the patient's satisfaction.    RRR  CTAB  [name]

## 2017-05-15 NOTE — Anesthesia Postprocedure Evaluation (Signed)
Anesthesia Post Note  Patient: Sabrina Woodard  Procedure(s) Performed: Procedure(s) (LRB): CYSTOSCOPY/URETEROSCOPY/HOLMIUM LASER/STENT PLACEMENT (Right)  Patient location during evaluation: PACU Anesthesia Type: General Level of consciousness: awake and alert and oriented Pain management: pain level controlled Vital Signs Assessment: post-procedure vital signs reviewed and stable Respiratory status: spontaneous breathing Cardiovascular status: blood pressure returned to baseline Anesthetic complications: no     Last Vitals:  Vitals:   05/15/17 1125 05/15/17 1141  BP: 117/77 137/83  Pulse: (!) 55 70  Resp: 12 14  Temp: (!) 36.2 C   SpO2: 97% 100%    Last Pain:  Vitals:   05/15/17 1208  TempSrc:   PainSc: 10-Worst pain ever                 Ashanti Littles

## 2017-05-15 NOTE — Progress Notes (Signed)
Dr. Apolinar Junes put the order in for the RUS already.

## 2017-05-15 NOTE — Anesthesia Post-op Follow-up Note (Signed)
Anesthesia QCDR form completed.        

## 2017-05-15 NOTE — OR Nursing (Signed)
Discussed nausea, pain, meds given and that pt threw up approx 20 min after taking po meds with Dr. Apolinar Junes 230 pm, and Toradol given as ordered.  She also advises if pt's nausea relieved after napping, then may give second dose of Norco.

## 2017-05-15 NOTE — Transfer of Care (Signed)
Immediate Anesthesia Transfer of Care Note  Patient: Sabrina Woodard  Procedure(s) Performed: Procedure(s): CYSTOSCOPY/URETEROSCOPY/HOLMIUM LASER/STENT PLACEMENT (Right)  Patient Location: PACU  Anesthesia Type:General  Level of Consciousness: drowsy and patient cooperative  Airway & Oxygen Therapy: Patient Spontanous Breathing and Patient connected to face mask oxygen  Post-op Assessment: Report given to RN, Post -op Vital signs reviewed and stable and Patient moving all extremities X 4  Post vital signs: Reviewed and stable  Last Vitals:  Vitals:   05/15/17 0820  BP: 138/78  Pulse: 71  Resp: 16  Temp: (!) 35.7 C  SpO2: 100%    Last Pain:  Vitals:   05/15/17 0823  PainSc: 4          Complications: No apparent anesthesia complications

## 2017-05-15 NOTE — Anesthesia Preprocedure Evaluation (Addendum)
Anesthesia Evaluation  Patient identified by MRN, date of birth, ID band Patient awake    Reviewed: Allergy & Precautions, NPO status , Patient's Chart, lab work & pertinent test results  Airway Mallampati: II  TM Distance: <3 FB     Dental  (+) Teeth Intact   Pulmonary neg pulmonary ROS,    Pulmonary exam normal        Cardiovascular negative cardio ROS Normal cardiovascular exam     Neuro/Psych Anxiety    GI/Hepatic negative GI ROS, Neg liver ROS,   Endo/Other  negative endocrine ROS  Renal/GU      Musculoskeletal negative musculoskeletal ROS (+)   Abdominal Normal abdominal exam  (+)   Peds  Hematology   Anesthesia Other Findings Past Medical History: No date: Anxiety 2014: Chronic kidney disease     Comment:  kidney stones 1995: Clotting disorder ([facility])     Comment:  Protien C deficcency No date: DVT (deep venous thrombosis) ([facility]) No date: History of kidney stones No date: Pneumothorax  Reproductive/Obstetrics                            Anesthesia Physical Anesthesia Plan  ASA: II  Anesthesia Plan: General   Post-op Pain Management:    Induction: Intravenous  PONV Risk Score and Plan:   Airway Management Planned: Oral ETT  Additional Equipment:   Intra-op Plan:   Post-operative Plan: Extubation in OR  Informed Consent: I have reviewed the patients History and Physical, chart, labs and discussed the procedure including the risks, benefits and alternatives for the proposed anesthesia with the patient or authorized representative who has indicated his/her understanding and acceptance.   Dental advisory given  Plan Discussed with: CRNA and Surgeon  Anesthesia Plan Comments:         Anesthesia Quick Evaluation

## 2017-05-15 NOTE — Telephone Encounter (Signed)
Received a call from the hospital for her follow up and they said she needed a RUS prior. If so I will need an order please and thank you.   Sabrina Woodard

## 2017-05-15 NOTE — Anesthesia Procedure Notes (Signed)
Procedure Name: Intubation Date/Time: 05/15/2017 9:23 AM Performed by: Silvana Newness Pre-anesthesia Checklist: Patient identified, Emergency Drugs available, Suction available, Patient being monitored and Timeout performed Patient Re-evaluated:Patient Re-evaluated prior to induction Oxygen Delivery Method: Circle system utilized Preoxygenation: Pre-oxygenation with 100% oxygen Induction Type: IV induction Ventilation: Mask ventilation without difficulty Laryngoscope Size: Mac and 3 Grade View: Grade I Tube type: Oral Tube size: 7.0 mm Number of attempts: 1 Airway Equipment and Method: Stylet Placement Confirmation: ETT inserted through vocal cords under direct vision,  positive ETCO2 and breath sounds checked- equal and bilateral Secured at: 20 cm Tube secured with: Tape Dental Injury: Teeth and Oropharynx as per pre-operative assessment

## 2017-05-15 NOTE — Op Note (Signed)
Date of procedure: 05/15/17  Preoperative diagnosis:  1. Right proximal ureteral stone    Postoperative diagnosis:  1. same   Procedure: 1. Right ureteroscopy with laser lithotripsy 2. Right retrograde pyelogram 3. Right ureteral stent placement  Surgeon: [name], MD  Anesthesia: General  Complications: None  Intraoperative findings: Narrow caliber ureter, requiring dilation to accommodate 7 French flexible scope. 6 mm stone dusted. Stent on tether placed.  EBL: minimal  Specimens: none  Drains: 6 x 24 French double-J ureteral stent on right, string left in place  Indication: Sabrina Woodard is a 41 y.o. patient with retained 6 mm right proximal ureteral stone who failed medical expulsive therapy.  After reviewing the management options for treatment, she elected to proceed with the above surgical procedure(s). We have discussed the potential benefits and risks of the procedure, side effects of the proposed treatment, the likelihood of the patient achieving the goals of the procedure, and any potential problems that might occur during the procedure or recuperation. Informed consent has been obtained.  Description of procedure:  The patient was taken to the operating room and general anesthesia was induced.  The patient was placed in the dorsal lithotomy position, prepped and draped in the usual sterile fashion, and preoperative antibiotics were administered. A preoperative time-out was performed.   A 21 French scope was advanced per urethra into the bladder.  Attention was turned to the right ureteral orifice which was cannulated with a sensor wire up to level of the kidney. On scout imaging, the stone could be seen at the level of the UPJ. Upon wire placement, the stone was knocked into a midpole calyx. A dual lumen access sheath was used to introduce the Super Stiff wire up to level of kidney. I tried unsuccessfully to advance a 7 [place] flexible ureteroscope up to level of  the kidney but was only able to pass it up to level the proximal ureter but not beyond this level. I then dilated the ureter gently using 810 ureteral dilator as well as using the inner lumen of the ureteral access sheath. On the second attempt the scope placement over the Super Stiff wire, successful. The stone was encountered and midpole calyx. A 270  laser fiber was then brought in and using the settings of 0.2 J and 40 Hz, the stone was dusted into smaller fragments. Settings were adjusted to 1.5 J and 15 Hz to further popcorn dust the residual stone burden. At the end of the testing, also fragments were smaller than the tip the laser fiber. All the additional calyces were directly visualized and no additional stone fragments were visualized. Contrast was injected to the scope to create a retrograde pyelogram which revealed no contrast extravasation and created a roadmap of the kidney to ensure that each every calyx was strictly visualized. The scope was then backed down the length of the ureter inspecting along the way. There was no ureteral trauma or injury appreciated. A 6 x 24 French double-J ureteral stent was then advanced over the safety wire up to level of the kidney. The wire was partially withdrawn until full coil was noted within the renal pelvis. The wire was then fully withdrawn and a full coil was noted within the bladder. The tether was left on the distal coil of the stent string.  This was attached to the patient's left inner thigh using Mastisol and Tegaderm. The bladder was drained. The patient was then cleaned and dried, repositioned the supine position, reversed from  anesthesia, taken the PACU in stable condition.  Plan: Patient was advised to maintain the stent as well as possible, for up to one week. She may resume her anticoagulation. She'll follow up in 1 month the renal shadow prior.  [name], M.D.

## 2017-05-16 ENCOUNTER — Encounter: Payer: Self-pay | Admitting: Urology

## 2017-05-16 NOTE — Telephone Encounter (Signed)
Done ° ° °michelle °

## 2017-05-19 ENCOUNTER — Emergency Department: Payer: 59

## 2017-05-19 ENCOUNTER — Encounter: Payer: Self-pay | Admitting: Emergency Medicine

## 2017-05-19 ENCOUNTER — Emergency Department
Admission: EM | Admit: 2017-05-19 | Discharge: 2017-05-19 | Disposition: A | Discharge status: Home / Self Care | Payer: 59 | Attending: Emergency Medicine | Admitting: Emergency Medicine

## 2017-05-19 DIAGNOSIS — Z7901 Long term (current) use of anticoagulants: Secondary | ICD-10-CM | POA: Diagnosis not present

## 2017-05-19 DIAGNOSIS — R109 Unspecified abdominal pain: Secondary | ICD-10-CM | POA: Diagnosis not present

## 2017-05-19 DIAGNOSIS — N189 Chronic kidney disease, unspecified: Secondary | ICD-10-CM | POA: Diagnosis not present

## 2017-05-19 LAB — URINALYSIS, COMPLETE (UACMP) WITH MICROSCOPIC
Bilirubin Urine: NEGATIVE
GLUCOSE, UA: 50 mg/dL — AB
KETONES UR: 5 mg/dL — AB
Nitrite: NEGATIVE
PH: 6 (ref 5.0–8.0)
PROTEIN: 100 mg/dL — AB
Specific Gravity, Urine: 1.015 (ref 1.005–1.030)

## 2017-05-19 LAB — COMPREHENSIVE METABOLIC PANEL
ALBUMIN: 4.2 g/dL (ref 3.5–5.0)
ALK PHOS: 62 U/L (ref 38–126)
ALT: 46 U/L (ref 14–54)
AST: 33 U/L (ref 15–41)
Anion gap: 8 (ref 5–15)
BUN: 10 mg/dL (ref 6–20)
CALCIUM: 8.7 mg/dL — AB (ref 8.9–10.3)
CHLORIDE: 102 mmol/L (ref 101–111)
CO2: 25 mmol/L (ref 22–32)
CREATININE: 0.84 mg/dL (ref 0.44–1.00)
GFR calc Af Amer: >60 mL/min (ref >60)
GFR calc non Af Amer: >60 mL/min (ref >60)
GLUCOSE: 121 mg/dL — AB (ref 65–99)
Potassium: 4.2 mmol/L (ref 3.5–5.1)
SODIUM: 135 mmol/L (ref 135–145)
Total Bilirubin: 0.6 mg/dL (ref 0.3–1.2)
Total Protein: 6.8 g/dL (ref 6.5–8.1)

## 2017-05-19 LAB — CBC
HCT: 46.5 % (ref 35.0–47.0)
Hemoglobin: 15.9 g/dL (ref 12.0–16.0)
MCH: 30.8 pg (ref 26.0–34.0)
MCHC: 34.1 g/dL (ref 32.0–36.0)
MCV: 90.3 fL (ref 80.0–100.0)
Platelets: 175 10*3/uL (ref 150–440)
RBC: 5.15 MIL/uL (ref 3.80–5.20)
RDW: 12.8 % (ref 11.5–14.5)
WBC: 9.8 10*3/uL (ref 3.6–11.0)

## 2017-05-19 LAB — LIPASE, BLOOD: LIPASE: 25 U/L (ref 11–51)

## 2017-05-19 LAB — PROTIME-INR
INR: 1.28
Prothrombin Time: 15.9 seconds — ABNORMAL HIGH (ref 11.4–15.2)

## 2017-05-19 MED ORDER — KETOROLAC TROMETHAMINE 30 MG/ML IJ SOLN
15.0000 mg | Freq: Once | INTRAMUSCULAR | Status: AC
  Administered 2017-05-19: 15 mg via INTRAVENOUS
  Filled 2017-05-19: qty 1

## 2017-05-19 MED ORDER — SODIUM CHLORIDE 0.9 % IV BOLUS (SEPSIS)
1000.0000 mL | Freq: Once | INTRAVENOUS | Status: AC
  Administered 2017-05-19: 1000 mL via INTRAVENOUS

## 2017-05-19 MED ORDER — NITROFURANTOIN MONOHYD MACRO 100 MG PO CAPS
100.0000 mg | ORAL_CAPSULE | Freq: Once | ORAL | Status: AC
  Administered 2017-05-19: 100 mg via ORAL
  Filled 2017-05-19: qty 1

## 2017-05-19 MED ORDER — ONDANSETRON HCL 4 MG/2ML IJ SOLN
4.0000 mg | Freq: Once | INTRAMUSCULAR | Status: AC
  Administered 2017-05-19: 4 mg via INTRAVENOUS
  Filled 2017-05-19: qty 2

## 2017-05-19 MED ORDER — NITROFURANTOIN MONOHYD MACRO 100 MG PO CAPS: 100.0000 mg | ORAL_CAPSULE | Freq: Two times a day (BID) | ORAL | 0 refills | Status: AC

## 2017-05-19 NOTE — ED Provider Notes (Signed)
[facility] Emergency Department Provider Note  Time seen: 6:37 PM  I have reviewed the triage vital signs and the nursing notes.   HISTORY  Chief Complaint Abdominal Pain    HPI AMORETTE CHARRETTE is a 41 y.o. female With a past medical history of anxiety, CK D, DVT, protein C deficiencywho presents to the emergency department for right flank pain. Patient had a kidney stone had a lithotripsy performed 4 days ago. Was told to hold the ureteral stent today which she did however this was followed by severe right flank pain which she states is a 10/10 along with nausea and vomiting. Patient denies any dysuria recently was states she has been having urinary frequency still. Denies any known hematuria. Denies any fever. States the pain has been very minimal prior to her pulling the ureteral stent.  Past Medical History:  Diagnosis Date  . Anxiety   . Chronic kidney disease 2014   kidney stones  . Clotting disorder (HCC) 1995   Protien C deficcency  . DVT (deep venous thrombosis) (HCC)   . History of kidney stones   . Pneumothorax     Patient Active Problem List   Diagnosis Date Noted  . Ureteral stone with hydronephrosis 05/04/2017  . Hematuria, gross 03/20/2017  . Anticoagulant-induced hematuria 03/06/2017  . Osteopenia of spine 12/24/2016  . Encounter for screening for cervical cancer  05/23/2015  . Encounter for preventive health examination 04/25/2015  . Encounter for therapeutic drug monitoring 10/30/2013  . Protein C deficiency (HCC) 07/21/2013  . History of pulmonary embolism 07/21/2013  . 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 Breast cancer screening 07/21/2013    Past Surgical History:  Procedure Laterality Date  . CYSTOSCOPY/URETEROSCOPY/HOLMIUM LASER/STENT PLACEMENT Right 05/15/2017   Procedure: CYSTOSCOPY/URETEROSCOPY/HOLMIUM LASER/STENT PLACEMENT;  Surgeon: [name], MD;  Location: [facility] ORS;  Service: Urology;  Laterality: Right;   . TONSILLECTOMY AND ADENOIDECTOMY Bilateral 1995    Prior to Admission medications   Medication Sig Start Date End Date Taking? Authorizing Provider  celecoxib (CELEBREX) 200 MG capsule Take 1 capsule (200 mg total) by mouth 2 (two) times daily. Patient not taking: Reported on 05/10/2017 05/04/17   Sherlene Shams, MD  ciprofloxacin (CIPRO) 250 MG tablet Take 1 tablet (250 mg total) by mouth 2 (two) times daily. Patient not taking: Reported on 05/08/2017 05/04/17   Sherlene Shams, MD  clindamycin (CLEOCIN) 300 MG capsule Take 1 capsule (300 mg total) by mouth 3 (three) times daily. 05/08/17   Michiel Cowboy A, PA-C  docusate sodium (COLACE) 100 MG capsule Take 1 capsule (100 mg total) by mouth 2 (two) times daily. 05/15/17   [name], MD  enoxaparin (LOVENOX) 60 MG/0.6ML injection Inject 0.55 mLs (55 mg total) into the skin every 12 (twelve) hours. 05/08/17   Sherlene Shams, MD  HYDROcodone-acetaminophen (NORCO) 10-325 MG tablet Take 1 tablet by mouth every 6 (six) hours as needed. For kidney stone 05/15/17   [name], MD  ondansetron (ZOFRAN ODT) 4 MG disintegrating tablet Take 1 tablet (4 mg total) by mouth every 8 (eight) hours as needed for nausea or vomiting. Patient not taking: Reported on 05/08/2017 05/04/17   Sherlene Shams, MD  oxybutynin (DITROPAN) 5 MG tablet Take 1 tablet (5 mg total) by mouth every 8 (eight) hours as needed for bladder spasms. 05/15/17   [name], MD  warfarin (COUMADIN) 2 MG tablet TAKE 1 TABLET DAILY ALONG WITH 5 MG TABLET FOR TOTAL DAILY DOSE  7 MG 04/27/17   Sherlene Shamsullo, Teresa L, MD  warfarin (COUMADIN) 3 MG tablet TAKE WITH 5 MG FOR TOTAL DAILY DOSE OF 8 MG Patient not taking: Reported on 05/08/2017 03/22/17   Sherlene Shamsullo, Teresa L, MD  warfarin (COUMADIN) 5 MG tablet Take 1 tablet (5 mg total) by mouth daily at 6 PM. 12/22/16   Sherlene Shamsullo, Teresa L, MD    Allergies  Allergen Reactions  . Penicillins Anaphylaxis    Has patient had a PCN reaction causing immediate  rash, facial/tongue/throat swelling, SOB or lightheadedness with hypotension: Yes Has patient had a PCN reaction causing severe rash involving mucus membranes or skin necrosis: No Has patient had a PCN reaction that required hospitalization: No Has patient had a PCN reaction occurring within the last 10 years: Yes If all of the above answers are "NO", then may proceed with Cephalosporin use.   . Sulfa Antibiotics Rash    Family History  Problem Relation Age of Onset  . Hyperlipidemia Mother   . Hypertension Mother   . Hyperlipidemia Father   . Hypertension Father   . Prostate cancer Father [id number]       prostate   . Stroke Maternal Grandfather   . Cancer Paternal Grandmother [id number]       breast ca   . Breast cancer Paternal Grandmother   . COPD Paternal Grandfather   . Kidney cancer Neg Hx   . Bladder Cancer Neg Hx     Social History Social History  Substance Use Topics  . Smoking status: Never Smoker  . Smokeless tobacco: Never Used  . Alcohol use No    Review of Systems Constitutional: Negative for fever. Cardiovascular: Negative for chest pain. Respiratory: Negative for shortness of breath. Gastrointestinal: right flank pain. Positive for nausea and vomiting Genitourinary: Negative for dysuria.positive for urinary frequency Neurological: Negative for headache All other ROS negative  ____________________________________________   PHYSICAL EXAM:  VITAL SIGNS: ED Triage Vitals  Enc Vitals Group     BP 05/19/17 1821 139/80     Pulse Rate 05/19/17 1821 78     Resp 05/19/17 1821 16     Temp 05/19/17 1821 98.5 F (36.9 C)     Temp Source 05/19/17 1821 Oral     SpO2 05/19/17 1821 98 %     Weight 05/19/17 1818 118 lb (53.5 kg)     Height --      Head Circumference --      Peak Flow --      Pain Score 05/19/17 1817 10     Pain Loc --      Pain Edu? --      Excl. in GC? --     Constitutional: Alert and oriented. Well appearing and in no distress. Eyes: Normal  exam ENT   Head: Normocephalic and atraumatic.   Mouth/Throat: Mucous membranes are moist. Cardiovascular: Normal rate, regular rhythm. No murmur Respiratory: Normal respiratory effort without tachypnea nor retractions. Breath sounds are clear  Gastrointestinal: soft, mild right sided abdominal tenderness palpation upper and lower, no rebound or guarding. No distention. Musculoskeletal: Nontender with normal range of motion in all extremities.  Neurologic:  Normal speech and language. No gross focal neurologic deficits Skin:  Skin is warm, dry and intact.  Psychiatric: Mood and affect are normal.  ____________________________________________  INITIAL IMPRESSION / ASSESSMENT AND PLAN / ED COURSE  Pertinent labs & imaging results that were available during my care of the patient were reviewed by me and considered in my medical  decision making (see chart for details).  patient presents to the emergency department for right flank pain after pulling her ureteral stent today as instructed. Patient states 10/10 pain currently. Patient does have mild to moderate right abdominal tenderness palpation but no CVA tenderness. We'll check labs, urinalysis, send a urine culture. We will treat with 50 mg of Toradol, normal saline, and Zofran.  patient states her pain is down to a 2 or 3/10. We will cover with several days of Keflex as a precaution. Patient has pain medication to take at home. I discussed return precautions for any worsening pain or fever. Patient agreeable.  ____________________________________________   FINAL CLINICAL IMPRESSION(S) / ED DIAGNOSES  right flank pain    Minna Antis, MD 05/19/17 1946

## 2017-05-19 NOTE — ED Triage Notes (Signed)
Pt states that she had lithotripsy Tuesday to bust up kidney stones in right kidney. Today pt took her stent out. Around 1 pm today pts pain got severe. Pt is not having any trouble voiding. Pt is also having N/V.

## 2017-05-19 NOTE — ED Notes (Signed)
Pt. Waiting in room for a couple of minutes family member bringing clothes to discharge home.

## 2017-05-21 ENCOUNTER — Encounter: Payer: Self-pay | Admitting: Emergency Medicine

## 2017-05-21 ENCOUNTER — Emergency Department: Payer: 59

## 2017-05-21 ENCOUNTER — Emergency Department
Admission: EM | Admit: 2017-05-21 | Discharge: 2017-05-21 | Disposition: A | Discharge status: Home / Self Care | Payer: 59 | Attending: Emergency Medicine | Admitting: Emergency Medicine

## 2017-05-21 DIAGNOSIS — Z79899 Other long term (current) drug therapy: Secondary | ICD-10-CM | POA: Insufficient documentation

## 2017-05-21 DIAGNOSIS — R791 Abnormal coagulation profile: Secondary | ICD-10-CM | POA: Diagnosis not present

## 2017-05-21 DIAGNOSIS — Z7901 Long term (current) use of anticoagulants: Secondary | ICD-10-CM | POA: Diagnosis not present

## 2017-05-21 DIAGNOSIS — N3001 Acute cystitis with hematuria: Secondary | ICD-10-CM | POA: Insufficient documentation

## 2017-05-21 DIAGNOSIS — N390 Urinary tract infection, site not specified: Secondary | ICD-10-CM

## 2017-05-21 DIAGNOSIS — N189 Chronic kidney disease, unspecified: Secondary | ICD-10-CM | POA: Insufficient documentation

## 2017-05-21 DIAGNOSIS — R110 Nausea: Secondary | ICD-10-CM | POA: Diagnosis not present

## 2017-05-21 DIAGNOSIS — N23 Unspecified renal colic: Secondary | ICD-10-CM | POA: Insufficient documentation

## 2017-05-21 DIAGNOSIS — R1011 Right upper quadrant pain: Secondary | ICD-10-CM | POA: Diagnosis present

## 2017-05-21 DIAGNOSIS — R319 Hematuria, unspecified: Secondary | ICD-10-CM

## 2017-05-21 LAB — URINE CULTURE

## 2017-05-21 LAB — URINALYSIS, ROUTINE W REFLEX MICROSCOPIC
Bilirubin Urine: NEGATIVE
Glucose, UA: NEGATIVE mg/dL
Ketones, ur: 5 mg/dL — AB
Nitrite: NEGATIVE
PROTEIN: 100 mg/dL — AB
SPECIFIC GRAVITY, URINE: 1.012 (ref 1.005–1.030)
pH: 6 (ref 5.0–8.0)

## 2017-05-21 LAB — COMPREHENSIVE METABOLIC PANEL
ALK PHOS: 61 U/L (ref 38–126)
ALT: 29 U/L (ref 14–54)
ANION GAP: 6 (ref 5–15)
AST: 22 U/L (ref 15–41)
Albumin: 3.8 g/dL (ref 3.5–5.0)
BILIRUBIN TOTAL: 0.6 mg/dL (ref 0.3–1.2)
BUN: 12 mg/dL (ref 6–20)
CALCIUM: 8.4 mg/dL — AB (ref 8.9–10.3)
CO2: 25 mmol/L (ref 22–32)
Chloride: 106 mmol/L (ref 101–111)
Creatinine, Ser: 1.17 mg/dL — ABNORMAL HIGH (ref 0.44–1.00)
GFR calc non Af Amer: 57 mL/min — ABNORMAL LOW (ref >60)
Glucose, Bld: 111 mg/dL — ABNORMAL HIGH (ref 65–99)
Potassium: 3.8 mmol/L (ref 3.5–5.1)
Sodium: 137 mmol/L (ref 135–145)
TOTAL PROTEIN: 6.5 g/dL (ref 6.5–8.1)

## 2017-05-21 LAB — CBC
HCT: 42.7 % (ref 35.0–47.0)
HEMOGLOBIN: 14.9 g/dL (ref 12.0–16.0)
MCH: 30.9 pg (ref 26.0–34.0)
MCHC: 34.9 g/dL (ref 32.0–36.0)
MCV: 88.7 fL (ref 80.0–100.0)
PLATELETS: 175 10*3/uL (ref 150–440)
RBC: 4.82 MIL/uL (ref 3.80–5.20)
RDW: 13 % (ref 11.5–14.5)
WBC: 8.2 10*3/uL (ref 3.6–11.0)

## 2017-05-21 LAB — PROTIME-INR
INR: 1.62
PROTHROMBIN TIME: 19.1 s — AB (ref 11.4–15.2)

## 2017-05-21 MED ORDER — ONDANSETRON HCL 4 MG/2ML IJ SOLN
4.0000 mg | Freq: Once | INTRAMUSCULAR | Status: AC
  Administered 2017-05-21: 4 mg via INTRAVENOUS
  Filled 2017-05-21: qty 2

## 2017-05-21 MED ORDER — SODIUM CHLORIDE 0.9 % IV BOLUS (SEPSIS)
1000.0000 mL | Freq: Once | INTRAVENOUS | Status: AC
  Administered 2017-05-21: 1000 mL via INTRAVENOUS

## 2017-05-21 MED ORDER — MORPHINE SULFATE (PF) 4 MG/ML IV SOLN
4.0000 mg | Freq: Once | INTRAVENOUS | Status: AC
  Administered 2017-05-21: 4 mg via INTRAVENOUS
  Filled 2017-05-21: qty 1

## 2017-05-21 MED ORDER — KETOROLAC TROMETHAMINE 30 MG/ML IJ SOLN
30.0000 mg | Freq: Once | INTRAMUSCULAR | Status: AC
  Administered 2017-05-21: 30 mg via INTRAVENOUS
  Filled 2017-05-21: qty 1

## 2017-05-21 MED ORDER — CIPROFLOXACIN HCL 500 MG PO TABS: 500.0000 mg | ORAL_TABLET | Freq: Two times a day (BID) | ORAL | 0 refills | Status: DC

## 2017-05-21 MED ORDER — FLUCONAZOLE 100 MG PO TABS
150.0000 mg | ORAL_TABLET | Freq: Once | ORAL | Status: AC
  Administered 2017-05-21: 14:00:00 150 mg via ORAL
  Filled 2017-05-21: qty 1

## 2017-05-21 MED ORDER — CIPROFLOXACIN IN D5W 400 MG/200ML IV SOLN
400.0000 mg | Freq: Once | INTRAVENOUS | Status: AC
  Administered 2017-05-21: 400 mg via INTRAVENOUS
  Filled 2017-05-21: qty 200

## 2017-05-21 NOTE — ED Notes (Signed)
Per Dr. Shaune PollackLord hold the IV antibiotic until Dr. Shaune PollackLord has spoken with urologist. Pt informed of this.

## 2017-05-21 NOTE — ED Notes (Signed)
Pt ambulatory to toilet to collect urine sample. Hat placed in toilet for pt comfort. Pt stated she did not need anyone in room with her. Husband in hallway. This RN informed pt that this RN would come back in a few minutes to make sure pt is ok.

## 2017-05-21 NOTE — ED Provider Notes (Signed)
[facility] Emergency Department Provider Note ____________________________________________   I have reviewed the triage vital signs and the triage nursing note.  HISTORY  Chief Complaint Flank Pain   Historian Patient, spouse and parent  HPI Sabrina Woodard is a 41 y.o. female with a history of right-sided 5 mm kidney stone which received laser lithotripsy on 05/15/17 by Dr. Apolinar Junes with urology, with stent placement. Patient pulled her own stent 4 days later. She presented to the emergency department that day due to uncontrolled pain and relief was treated symptomatically.  It's been a few more days and patient is still having uncontrolled pain on the right side similar to when she was diagnosed with the kidney stone. Pain at times is 10 out of 10. Associated with nausea without vomiting. No bowel changes. No fever. Urine is still dark. She is on Coumadin for history of DVT and is on her blood thinner.  She has been taking oxycodone 10 mg every 4-6 hours with inadequate pain relief.    Past Medical History:  Diagnosis Date  . Anxiety   . Chronic kidney disease 2014   kidney stones  . Clotting disorder (HCC) 1995   Protien C deficcency  . DVT (deep venous thrombosis) (HCC)   . History of kidney stones   . Pneumothorax     Patient Active Problem List   Diagnosis Date Noted  . Ureteral stone with hydronephrosis 05/04/2017  . Hematuria, gross 03/20/2017  . Anticoagulant-induced hematuria 03/06/2017  . Osteopenia of spine 12/24/2016  . Encounter for screening for cervical cancer  05/23/2015  . Encounter for preventive health examination 04/25/2015  . Encounter for therapeutic drug monitoring 10/30/2013  . Protein C deficiency (HCC) 07/21/2013  . History of pulmonary embolism 07/21/2013  . 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 Breast cancer screening 07/21/2013    Past Surgical History:  Procedure Laterality Date  .  CYSTOSCOPY/URETEROSCOPY/HOLMIUM LASER/STENT PLACEMENT Right 05/15/2017   Procedure: CYSTOSCOPY/URETEROSCOPY/HOLMIUM LASER/STENT PLACEMENT;  Surgeon: [name], MD;  Location: [facility] ORS;  Service: Urology;  Laterality: Right;  . TONSILLECTOMY AND ADENOIDECTOMY Bilateral 1995    Prior to Admission medications   Medication Sig Start Date End Date Taking? Authorizing Provider  celecoxib (CELEBREX) 200 MG capsule Take 1 capsule (200 mg total) by mouth 2 (two) times daily. Patient not taking: Reported on 05/10/2017 05/04/17   Sherlene Shams, MD  ciprofloxacin (CIPRO) 500 MG tablet Take 1 tablet (500 mg total) by mouth 2 (two) times daily. 05/21/17   Governor Rooks, MD  clindamycin (CLEOCIN) 300 MG capsule Take 1 capsule (300 mg total) by mouth 3 (three) times daily. 05/08/17   Michiel Cowboy A, PA-C  docusate sodium (COLACE) 100 MG capsule Take 1 capsule (100 mg total) by mouth 2 (two) times daily. 05/15/17   [name], MD  enoxaparin (LOVENOX) 60 MG/0.6ML injection Inject 0.55 mLs (55 mg total) into the skin every 12 (twelve) hours. 05/08/17   Sherlene Shams, MD  HYDROcodone-acetaminophen (NORCO) 10-325 MG tablet Take 1 tablet by mouth every 6 (six) hours as needed. For kidney stone 05/15/17   [name], MD  nitrofurantoin, macrocrystal-monohydrate, (MACROBID) 100 MG capsule Take 1 capsule (100 mg total) by mouth 2 (two) times daily. 05/19/17 05/22/17  Minna Antis, MD  ondansetron (ZOFRAN ODT) 4 MG disintegrating tablet Take 1 tablet (4 mg total) by mouth every 8 (eight) hours as needed for nausea or vomiting. Patient not taking: Reported on 05/08/2017 05/04/17   Sherlene Shams,  MD  oxybutynin (DITROPAN) 5 MG tablet Take 1 tablet (5 mg total) by mouth every 8 (eight) hours as needed for bladder spasms. 05/15/17   Vanna ScotlandBrandon, Ashley, MD  warfarin (COUMADIN) 2 MG tablet TAKE 1 TABLET DAILY ALONG WITH 5 MG TABLET FOR TOTAL DAILY DOSE 7 MG 04/27/17   Sherlene Shamsullo, Teresa L, MD  warfarin (COUMADIN) 3 MG  tablet TAKE WITH 5 MG FOR TOTAL DAILY DOSE OF 8 MG Patient not taking: Reported on 05/08/2017 03/22/17   Sherlene Shamsullo, Teresa L, MD  warfarin (COUMADIN) 5 MG tablet Take 1 tablet (5 mg total) by mouth daily at 6 PM. 12/22/16   Sherlene Shamsullo, Teresa L, MD    Allergies  Allergen Reactions  . Penicillins Anaphylaxis    Has patient had a PCN reaction causing immediate rash, facial/tongue/throat swelling, SOB or lightheadedness with hypotension: Yes Has patient had a PCN reaction causing severe rash involving mucus membranes or skin necrosis: No Has patient had a PCN reaction that required hospitalization: No Has patient had a PCN reaction occurring within the last 10 years: Yes If all of the above answers are "NO", then may proceed with Cephalosporin use.   . Sulfa Antibiotics Rash    Family History  Problem Relation Age of Onset  . Hyperlipidemia Mother   . Hypertension Mother   . Hyperlipidemia Father   . Hypertension Father   . Prostate cancer Father [id number]       prostate   . Stroke Maternal Grandfather   . Cancer Paternal Grandmother 1245       breast ca   . Breast cancer Paternal Grandmother   . COPD Paternal Grandfather   . Kidney cancer Neg Hx   . Bladder Cancer Neg Hx     Social History Social History  Substance Use Topics  . Smoking status: Never Smoker  . Smokeless tobacco: Never Used  . Alcohol use No    Review of Systems  Constitutional: Negative for fever. Eyes: Negative for visual changes. ENT: Negative for sore throat. Cardiovascular: Negative for chest pain. Respiratory: Negative for shortness of breath. Gastrointestinal: Negative for diarrhea. Genitourinary: Positive for dysuria. Musculoskeletal: Negative for back pain. Skin: Negative for rash. Neurological: Negative for headache.  ____________________________________________   PHYSICAL EXAM:  VITAL SIGNS: ED Triage Vitals  Enc Vitals Group     BP 05/21/17 0920 132/76     Pulse Rate 05/21/17 0920 87     Resp  05/21/17 0920 16     Temp 05/21/17 0920 99.1 F (37.3 C)     Temp Source 05/21/17 0920 Oral     SpO2 05/21/17 0920 100 %     Weight 05/21/17 0921 115 lb (52.2 kg)     Height 05/21/17 0921 5' (1.524 m)     Head Circumference --      Peak Flow --      Pain Score 05/21/17 0928 10     Pain Loc --      Pain Edu? --      Excl. in GC? --      Constitutional: Alert and oriented. No distress, but looks uncomfortable. HEENT   Head: Normocephalic and atraumatic.      Eyes: Conjunctivae are normal. Pupils equal and round.       Ears:         Nose: No congestion/rhinnorhea.   Mouth/Throat: Mucous membranes are moist.   Neck: No stridor. Cardiovascular/Chest: Normal rate, regular rhythm.  No murmurs, rubs, or gallops. Respiratory: Normal respiratory effort without tachypnea  nor retractions. Breath sounds are clear and equal bilaterally. No wheezes/rales/rhonchi. Gastrointestinal: Soft. No distention, no guarding, no rebound. Moderate tenderness right side abdomen without focal mcBurneys point tenderness  Genitourinary/rectal:Deferred Musculoskeletal: Nontender with normal range of motion in all extremities. No joint effusions.  No lower extremity tenderness.  No edema. Neurologic:  Normal speech and language. No gross or focal neurologic deficits are appreciated. Skin:  Skin is warm, dry and intact. No rash noted. Psychiatric: Mood and affect are normal. Speech and behavior are normal. Patient exhibits appropriate insight and judgment.   ____________________________________________  LABS (pertinent positives/negatives)  Labs Reviewed  COMPREHENSIVE METABOLIC PANEL - Abnormal; Notable for the following:       Result Value   Glucose, Bld 111 (*)    Creatinine, Ser 1.17 (*)    Calcium 8.4 (*)    GFR calc non Af Amer 57 (*)    All other components within normal limits  URINALYSIS, ROUTINE W REFLEX MICROSCOPIC - Abnormal; Notable for the following:    Color, Urine YELLOW (*)     APPearance CLOUDY (*)    Hgb urine dipstick LARGE (*)    Ketones, ur 5 (*)    Protein, ur 100 (*)    Leukocytes, UA MODERATE (*)    Bacteria, UA FEW (*)    Squamous Epithelial / LPF 0-5 (*)    All other components within normal limits  PROTIME-INR - Abnormal; Notable for the following:    Prothrombin Time 19.1 (*)    All other components within normal limits  URINE CULTURE  CBC    ____________________________________________    EKG I, Governor Rooks, MD, the attending physician have personally viewed and interpreted all ECGs.  None ____________________________________________  RADIOLOGY All Xrays were viewed by me. Imaging interpreted by Radiologist.  US renal:  IMPRESSION: 1. Mild right hydronephrosis. The patient did have a previous 5 mm stone along the right collecting system on 05/04/2017 which is currently not visualized by sonography. Although this could certainly be in the ureter, there was a right ureteral jet indicating that any obstruction on the right side is not complete. __________________________________________  PROCEDURES  Procedure(s) performed: None  Critical Care performed: None   ____________________________________________   ED COURSE / ASSESSMENT AND PLAN  Pertinent labs & imaging results that were available during my care of the patient were reviewed by me and considered in my medical decision making (see chart for details).   Ms. Ketner is still having what appears to be ureteral colic, at this point about 6 days post-laser lithotripsy and she took her stent out at 3-4 days.  She does not appear septic. No reported fevers. Urinalysis does have few bacteria and leukocytes. I sent a culture. She was started on Macrobid 3 days ago in the emergency department and the culture from that day showed multiple species.  We'll give her dose of Cipro and discharged with Cipro, and discontinue the Macrobid. She does report anaphylaxis to  penicillins.  Creatinine slightly decreased from previous, she was given IV fluids here.  Renal ultrasound was obtained and continues to show mild right Hydro but ureteral jet is seen.  I spoke with on-call urologist Dr. Mena Goes who is most suspicious of edema as a source of the ongoing ureteral colic. He did initially recommend continued pain control, consider adding ibuprofen as needed.    CONSULTATIONS: Dr. Mena Goes, urology - recommends symptomatic treatment, follow up in office.  We discussed ua results and antibiotic choice given anaphylaxis to penicillin.  Decided upon cipro.  He recommended 3 more days, I will provide rx for 7 days -- can follow up with Dr. Apolinar Junes this week to discuss duration of 3 vs 7 days.  Patient / Family / Caregiver informed of clinical course, medical decision-making process, and agree with plan.   I discussed return precautions, follow-up instructions, and discharge instructions with patient and/or family.  Discharge Instructions : You were evaluated for persistent right-sided pain after kidney stone procedure, and your exam and evaluation are overall reassuring in the emergency department today.  It is suspected that your ongoing pain is likely due to inflammation/edema after the procedure. Please add on the anti-inflammatory Celebrex that was previously prescribed in addition to you oxycodone pain pills.  Your urinalysis does show signs of urinary infection and your being switched antibiotics, start Cipro tonight and discontinue the Macrobid. Please call your urologist office tomorrow to have close follow-up for the urine culture.  Return to the emergency room immediately for any worsening or uncontrolled pain, inability to urinate, fever, nausea and vomiting and unable to keep medications down, or any other symptoms concerning to you.  ___________________________________________   FINAL CLINICAL IMPRESSION(S) / ED DIAGNOSES   Final diagnoses:   Ureteral colic  Urinary tract infection with hematuria, site unspecified  Subtherapeutic international normalized ratio (INR)              Note: This dictation was prepared with Dragon dictation. Any transcriptional errors that result from this process are unintentional    Governor Rooks, MD 05/21/17 1414

## 2017-05-21 NOTE — Discharge Instructions (Signed)
You were evaluated for persistent right-sided pain after kidney stone procedure, and your exam and evaluation are overall reassuring in the emergency department today.  It is suspected that your ongoing pain is likely due to inflammation/edema after the procedure. Please add on the anti-inflammatory Celebrex that was previously prescribed in addition to you oxycodone pain pills.  Your urinalysis does show signs of urinary infection and your being switched antibiotics, start Cipro tonight and discontinue the Macrobid. Please call your urologist office tomorrow to have close follow-up for the urine culture.  Return to the emergency room immediately for any worsening or uncontrolled pain, inability to urinate, fever, nausea and vomiting and unable to keep medications down, or any other symptoms concerning to you.  Your INR was low at 1.6 today -- take 10mg  today and then resume 7mg  daily, and please follow up with Primary Doctor in 1-2 days for repeat INR testing.

## 2017-05-21 NOTE — ED Notes (Signed)
Dr. Lord at bedside.  

## 2017-05-21 NOTE — ED Triage Notes (Signed)
Pt to ed with c/o right flank pain radiates to right groin,  Pt states lithotripsy oin Tuesday of last week and since with increased pain, removed stent on Saturday at which time pain became severe.  Pt states was seen here on Saturday for same pain.

## 2017-05-21 NOTE — ED Notes (Signed)
Pt returned from U/S via stretcher. 

## 2017-05-21 NOTE — ED Notes (Signed)
Pt in US

## 2017-05-23 ENCOUNTER — Telehealth: Payer: Self-pay | Admitting: Urology

## 2017-05-23 LAB — URINE CULTURE

## 2017-05-23 NOTE — Telephone Encounter (Signed)
No, urine culture did not grow any bacteria. Her UA was consistent with recent surgical intervention. It's probably fine for her to stop antibiotics at this point. She should follow-up as scheduled with repeat renal ultrasound later this month to ensure that the hydronephrosis/swelling has resolved.  Vanna ScotlandAshley Lama Narayanan, MD

## 2017-05-23 NOTE — Telephone Encounter (Signed)
Spoke with pt in reference to ucx results and stopping abx. Also reinforced with pt to keep f/u appt and have RUS prior. Pt voiced understanding.

## 2017-05-23 NOTE — Telephone Encounter (Signed)
Patient would like for you to review her notes from the ED and advise her on if she needs to continue taking the cipro and wants to know what her urine grew out if anything. She stated no one from our office called her back on Monday the 4th so she went back to the ED for help. I explained the office was closed and asked if she called the 24 hour nurse line she said yes but there is no  documentation of this anywhere. She would like a call back today to discuss what she needs to do.  [phone]  Thanks, Marcelino DusterMichelle

## 2017-05-24 ENCOUNTER — Other Ambulatory Visit: Payer: Self-pay | Admitting: Internal Medicine

## 2017-05-26 ENCOUNTER — Other Ambulatory Visit: Payer: Self-pay | Admitting: Internal Medicine

## 2017-05-28 ENCOUNTER — Telehealth: Payer: Self-pay | Admitting: *Deleted

## 2017-05-28 DIAGNOSIS — Z7901 Long term (current) use of anticoagulants: Secondary | ICD-10-CM

## 2017-05-28 NOTE — Telephone Encounter (Signed)
I placed a standing order for a PT/INR.  If other labs are needed, please place future orders prior to lab appt. On 05/29/17.  Thanks.

## 2017-05-29 ENCOUNTER — Encounter: Payer: Self-pay | Admitting: Internal Medicine

## 2017-05-29 ENCOUNTER — Other Ambulatory Visit (INDEPENDENT_AMBULATORY_CARE_PROVIDER_SITE_OTHER): Payer: 59

## 2017-05-29 DIAGNOSIS — Z7901 Long term (current) use of anticoagulants: Secondary | ICD-10-CM

## 2017-05-29 LAB — PROTIME-INR
INR: 5.4 ratio — AB (ref 0.8–1.0)
PROTHROMBIN TIME: 57.1 s — AB (ref 9.6–13.1)

## 2017-05-30 ENCOUNTER — Other Ambulatory Visit: Payer: Self-pay | Admitting: Internal Medicine

## 2017-05-30 ENCOUNTER — Telehealth: Payer: Self-pay | Admitting: Internal Medicine

## 2017-05-30 DIAGNOSIS — Z5181 Encounter for therapeutic drug level monitoring: Secondary | ICD-10-CM

## 2017-05-30 NOTE — Telephone Encounter (Signed)
Pt called wanting to know what she should do far as repeating her PT/INR. Please advise?  Call pt @ [phone]. Thank you!

## 2017-05-30 NOTE — Telephone Encounter (Signed)
See result note message 

## 2017-06-05 ENCOUNTER — Other Ambulatory Visit (INDEPENDENT_AMBULATORY_CARE_PROVIDER_SITE_OTHER): Payer: 59

## 2017-06-05 DIAGNOSIS — Z7901 Long term (current) use of anticoagulants: Secondary | ICD-10-CM

## 2017-06-06 LAB — PROTIME-INR
INR: 2.5 — ABNORMAL HIGH
PROTHROMBIN TIME: 26.3 s — AB (ref 9.0–11.5)

## 2017-06-08 ENCOUNTER — Ambulatory Visit
Admission: RE | Admit: 2017-06-08 | Discharge: 2017-06-08 | Disposition: A | Discharge status: Home / Self Care | Payer: 59 | Source: Ambulatory Visit | Attending: Urology | Admitting: Urology

## 2017-06-08 ENCOUNTER — Encounter: Payer: Self-pay | Admitting: Internal Medicine

## 2017-06-08 DIAGNOSIS — N1330 Unspecified hydronephrosis: Secondary | ICD-10-CM | POA: Diagnosis not present

## 2017-06-08 DIAGNOSIS — N201 Calculus of ureter: Secondary | ICD-10-CM

## 2017-06-08 DIAGNOSIS — Z87442 Personal history of urinary calculi: Secondary | ICD-10-CM | POA: Diagnosis not present

## 2017-06-11 NOTE — Progress Notes (Signed)
06/12/2017 2:01 PM   Sabrina Woodard 12/21/1975 [id number]  Referring provider: Crecencio Mc, MD [place], [place] [address]  Chief Complaint  Patient presents with  . Follow-up    RUS results    HPI: 41 yo WF with a history of a right ureteral stone who is s/p right URS who presents today for a follow up.  Background history Patient is a 41 year old Caucasian female who is referred by Dr. Derrel Nip for nephrolithiasis.  She states that she started experiencing gross hematuria with clots that started in June. She then started feeling fatigued with occasional crampy right-sided flank pain.   Contrast CT performed on 03/30/2017 noted a 5 mm proximal right ureteral calculus.  No hydronephrosis.  She proceeded with MET with tamsulosin.  Unfortunately, she could not tolerate the tamsulosin due to hypotension.  A CT Renal stone study noted a right-sided obstructive uropathy with 6 x 6 x 3 mm ureteropelvic junction stone causing mild hydronephrosis. The stone appears slightly more proximal than on the prior study.  No other nephroureterolithiasis.  Minimal Aortic Atherosclerosis (ICD10-I70.0).  She was then referred to [place].   WBC count was 4.8.  Creatinine was 0.85.  Urine culture was positive for beta hemolytic strep.   UA positive for > 30 RBC's.   She does have a prior history of stones.    She underwent right URS/LL/ureteral stent placement with 05/15/2017 with Dr. Erlene Quan.  Stent was left on a tether and she removed it herself one week later.  Her procedure was as expected and uneventful.  She did have have intense pain after she removed her stent.  The pain lasted on and off for two days.  She did removed the stent a bit prematurely as the tether snagged in the shower.  It stayed in a total of 5 days.    RUS performed on 06/08/2017 noted minimal right hydronephrosis noted on today's exam. Hydronephrosis has improved slightly from prior exam.  Today, she is  experiencing urinary frequency, urgency, nocturia, painful intercourse and back pain. She states she feels that she does not empty her bladder.  She has not had gross hematuria, suprapubic pain or dysuria.  She has not had any fevers, chills, nausea or vomiting.   Her UA today is positive for > 30 RBC's.     PMH: Past Medical History:  Diagnosis Date  . Anxiety   . Chronic kidney disease 2014   kidney stones  . 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 DVT (deep venous thrombosis) ([facility])   . History of kidney stones   . Pneumothorax     Surgical History: Past Surgical History:  Procedure Laterality Date  . CYSTOSCOPY/URETEROSCOPY/HOLMIUM LASER/STENT PLACEMENT Right 05/15/2017   Procedure: CYSTOSCOPY/URETEROSCOPY/HOLMIUM LASER/STENT PLACEMENT;  Surgeon: Hollice Espy, MD;  Location: [facility] ORS;  Service: Urology;  Laterality: Right;  . TONSILLECTOMY AND ADENOIDECTOMY Bilateral 1995    Home Medications:  Allergies as of 06/12/2017      Reactions   Penicillins Anaphylaxis   Has patient had a PCN reaction causing immediate rash, facial/tongue/throat swelling, SOB or lightheadedness with hypotension: Yes Has patient had a PCN reaction causing severe rash involving mucus membranes or skin necrosis: No Has patient had a PCN reaction that required hospitalization: No Has patient had a PCN reaction occurring within the last 10 years: Yes If all of the above answers are "NO", then may proceed with Cephalosporin use.   Sulfa  Antibiotics Rash      Medication List       Accurate as of 06/12/17  2:01 PM. Always use your most recent med list.          celecoxib 200 MG capsule Commonly known as:  CELEBREX TAKE 1 CAPSULE (200 MG TOTAL) BY MOUTH 2 (TWO) TIMES DAILY.   enoxaparin 60 MG/0.6ML injection Commonly known as:  LOVENOX Inject 0.55 mLs (55 mg total) into the skin every 12 (twelve) hours.   ondansetron 4 MG disintegrating tablet Commonly known as:  ZOFRAN ODT Take  1 tablet (4 mg total) by mouth every 8 (eight) hours as needed for nausea or vomiting.   oxybutynin 5 MG tablet Commonly known as:  DITROPAN Take 1 tablet (5 mg total) by mouth every 8 (eight) hours as needed for bladder spasms.   warfarin 5 MG tablet Commonly known as:  COUMADIN Take 1 tablet (5 mg total) by mouth daily at 6 PM.   warfarin 3 MG tablet Commonly known as:  COUMADIN TAKE WITH 5 MG FOR TOTAL DAILY DOSE OF 8 MG   warfarin 2 MG tablet Commonly known as:  COUMADIN TAKE 1 TABLET DAILY ALONG WITH 5 MG TABLET FOR TOTAL DAILY DOSE 7 MG            Discharge Care Instructions        Start     Ordered   06/12/17 0000  Urinalysis, Complete     06/12/17 1341   06/12/17 0000  Bladder Scan (Post Void Residual) in office     06/12/17 1341   06/12/17 0000  CULTURE, URINE COMPREHENSIVE     06/12/17 1355      Allergies:  Allergies  Allergen Reactions  . Penicillins Anaphylaxis    Has patient had a PCN reaction causing immediate rash, facial/tongue/throat swelling, SOB or lightheadedness with hypotension: Yes Has patient had a PCN reaction causing severe rash involving mucus membranes or skin necrosis: No Has patient had a PCN reaction that required hospitalization: No Has patient had a PCN reaction occurring within the last 10 years: Yes If all of the above answers are "NO", then may proceed with Cephalosporin use.   . Sulfa Antibiotics Rash    Family History: Family History  Problem Relation Age of Onset  . Hyperlipidemia Mother   . Hypertension Mother   . Hyperlipidemia Father   . Hypertension Father   . Prostate cancer Father 60       prostate   . Stroke Maternal Grandfather   . Cancer Paternal Grandmother 45       breast ca   . Breast cancer Paternal Grandmother   . COPD Paternal Grandfather   . Kidney cancer Neg Hx   . Bladder Cancer Neg Hx     Social History:  reports that she has never smoked. She has never used smokeless tobacco. She reports  that she does not drink alcohol or use drugs.  ROS: UROLOGY Frequent Urination?: Yes Hard to postpone urination?: Yes Burning/pain with urination?: No Get up at night to urinate?: Yes Leakage of urine?: No Urine stream starts and stops?: No Trouble starting stream?: No Do you have to strain to urinate?: No Blood in urine?: No Urinary tract infection?: No Sexually transmitted disease?: No Injury to kidneys or bladder?: No Painful intercourse?: Yes Weak stream?: No Currently pregnant?: No Vaginal bleeding?: No Last menstrual period?: n  Gastrointestinal Nausea?: No Vomiting?: No Indigestion/heartburn?: No Diarrhea?: No Constipation?: No  Constitutional Fever: No Night   sweats?: No Weight loss?: No Fatigue?: No  Skin Skin rash/lesions?: No Itching?: No  Eyes Blurred vision?: No Double vision?: No  Ears/Nose/Throat Sore throat?: No Sinus problems?: No  Hematologic/Lymphatic Swollen glands?: No Easy bruising?: No  Cardiovascular Leg swelling?: No Chest pain?: No  Respiratory Cough?: No Shortness of breath?: No  Endocrine Excessive thirst?: No  Musculoskeletal Back pain?: Yes Joint pain?: No  Neurological Headaches?: No Dizziness?: No  Psychologic Depression?: No Anxiety?: Yes  Physical Exam: BP 107/74 (BP Location: Right Arm, Patient Position: Sitting, Cuff Size: Normal)   Pulse 80   Ht 4' 11" (1.499 m)   Wt 112 lb 6.4 oz (51 kg)   BMI 22.70 kg/m   Constitutional: Well nourished. Alert and oriented, No acute distress. HEENT: [place] AT, moist mucus membranes. Trachea midline, no masses. Cardiovascular: No clubbing, cyanosis, or edema. Respiratory: Normal respiratory effort, no increased work of breathing. GU: No CVA tenderness.  No bladder fullness or masses.   Skin: No rashes, bruises or suspicious lesions. Lymph: No cervical or inguinal adenopathy. Neurologic: Grossly intact, no focal deficits, moving all 4 extremities. Psychiatric:  Normal mood and affect.  Laboratory Data: Lab Results  Component Value Date   WBC 8.2 05/21/2017   HGB 14.9 05/21/2017   HCT 42.7 05/21/2017   MCV 88.7 05/21/2017   PLT 175 05/21/2017    Lab Results  Component Value Date   CREATININE 1.17 (H) 05/21/2017     Lab Results  Component Value Date   TSH 1.36 12/22/2016       Component Value Date/Time   CHOL 187 12/22/2016 0857   HDL 53.40 12/22/2016 0857   CHOLHDL 3 12/22/2016 0857   VLDL 15.2 12/22/2016 0857   LDLCALC 118 (H) 12/22/2016 0857    Lab Results  Component Value Date   AST 22 05/21/2017   Lab Results  Component Value Date   ALT 29 05/21/2017    Urinalysis > 30 RBC's.  See EPIC.    I have reviewed the labs.  Pertinent Imaging: CLINICAL DATA:  History of right ureteral stone.  EXAM: RENAL / URINARY TRACT ULTRASOUND COMPLETE  COMPARISON:  Ultrasound 05/21/2017.  FINDINGS: Right Kidney:  Length: 9.6 cm. Echogenicity within normal limits. No mass. Minimal right hydronephrosis.  Left Kidney:  Length: 9.6 cm. Echogenicity within normal limits. No mass or hydronephrosis visualized.  Bladder:  Appears normal for degree of bladder distention. Bilateral ureteral jets noted.  IMPRESSION: Minimal right hydronephrosis noted on today's exam. Hydronephrosis has improved slightly from prior exam.   Electronically Signed   By: Thomas  Register   On: 06/08/2017 15:05   I have independently reviewed the films.    Assessment & Plan:    1. Right ureteral stone  - s/p urs   - given ABC's of Kidney stone prevention  - Encourage the patient to drink 2.5 L of water daily  - Offered 24-hour urine metabolic workup - patient declined   2. Protein C deficiency  - managed by Dr. Tullo   3. Right hydronephrosis  - minimal hydronephrosis seen on recent RUS - instructed the patient to increase her fluids and repeat the study in one month   4. Gross hematuria  - UA today > 30 RBC's - sent  for culture as patient is having symptoms  - continue to monitor the patient's UA after the treatment/passage of the stone to ensure the hematuria has resolved  - if hematuria persists, we will pursue a hematuria workup with CT Urogram and   cystoscopy if appropriate.  Return in about 1 month (around 07/12/2017) for RUS report.  These notes generated with voice recognition software. I apologize for typographical errors.  SHANNON MCGOWAN, PA-C  [facility] [address], [address] [place], [place] [address] [phone]  

## 2017-06-12 ENCOUNTER — Ambulatory Visit (INDEPENDENT_AMBULATORY_CARE_PROVIDER_SITE_OTHER): Payer: 59 | Admitting: Urology

## 2017-06-12 ENCOUNTER — Encounter: Payer: Self-pay | Admitting: Urology

## 2017-06-12 VITALS — BP 107/74 | HR 80 | Ht 59.0 in | Wt 112.4 lb

## 2017-06-12 DIAGNOSIS — N132 Hydronephrosis with renal and ureteral calculous obstruction: Secondary | ICD-10-CM | POA: Diagnosis not present

## 2017-06-12 DIAGNOSIS — D6859 Other primary thrombophilia: Secondary | ICD-10-CM

## 2017-06-12 DIAGNOSIS — N201 Calculus of ureter: Secondary | ICD-10-CM | POA: Diagnosis not present

## 2017-06-12 DIAGNOSIS — R310 Gross hematuria: Secondary | ICD-10-CM

## 2017-06-12 LAB — URINALYSIS, COMPLETE
Bilirubin, UA: NEGATIVE
GLUCOSE, UA: NEGATIVE
KETONES UA: NEGATIVE
LEUKOCYTES UA: NEGATIVE
Nitrite, UA: NEGATIVE
Urobilinogen, Ur: 0.2 mg/dL (ref 0.2–1.0)
pH, UA: 5.5 (ref 5.0–7.5)

## 2017-06-12 LAB — MICROSCOPIC EXAMINATION

## 2017-06-12 LAB — BLADDER SCAN AMB NON-IMAGING: Scan Result: 0

## 2017-06-15 ENCOUNTER — Telehealth: Payer: Self-pay | Admitting: Family Medicine

## 2017-06-15 LAB — CULTURE, URINE COMPREHENSIVE

## 2017-06-15 NOTE — Telephone Encounter (Signed)
Patient notified

## 2017-06-15 NOTE — Telephone Encounter (Signed)
-----   Message from Harle Battiest, PA-C sent at 06/15/2017 11:58 AM EDT ----- Please let Mrs. Bentson know that her urine culture is negative.

## 2017-06-16 ENCOUNTER — Other Ambulatory Visit: Payer: Self-pay | Admitting: Internal Medicine

## 2017-07-10 NOTE — Progress Notes (Deleted)
  07/12/2017 9:45 PM   Sabrina Woodard 12/26/1975 [id number]  Referring provider: Tullo, Teresa L, MD [address] , [place] [address]  No chief complaint on file.   HPI: 41 yo WF with a history of a right ureteral stone who is s/p right URS who presents today for a follow up.  Background history Patient is a 41 year old Caucasian female who is referred by Dr. Tullo for nephrolithiasis.  She states that she started experiencing gross hematuria with clots that started in June. She then started feeling fatigued with occasional crampy right-sided flank pain.   Contrast CT performed on 03/30/2017 noted a 5 mm proximal right ureteral calculus.  No hydronephrosis.  She proceeded with MET with tamsulosin.  Unfortunately, she could not tolerate the tamsulosin due to hypotension.  A CT Renal stone study noted a right-sided obstructive uropathy with 6 x 6 x 3 mm ureteropelvic junction stone causing mild hydronephrosis. The stone appears slightly more proximal than on the prior study.  No other nephroureterolithiasis.  Minimal Aortic Atherosclerosis (ICD10-I70.0).  She was then referred to us.   WBC count was 4.8.  Creatinine was 0.85.  Urine culture was positive for beta hemolytic strep.   UA positive for > 30 RBC's.   She does have a prior history of stones.    She underwent right URS/LL/ureteral stent placement with 05/15/2017 with Dr. [place].  Stent was left on a tether and she removed it herself one week later.  Her procedure was as expected and uneventful.  She did have have intense pain after she removed her stent.  The pain lasted on and off for two days.  She did removed the stent a bit prematurely as the tether snagged in the shower.  It stayed in a total of 5 days.    RUS performed on 06/08/2017 noted minimal right hydronephrosis noted on today's exam. Hydronephrosis has improved slightly from prior exam.  Today, she is experiencing urinary frequency, urgency, nocturia,  painful intercourse and back pain. She states she feels that she does not empty her bladder.  She has not had gross hematuria, suprapubic pain or dysuria.  She has not had any fevers, chills, nausea or vomiting.   Her UA today is positive for > 30 RBC's.     PMH: Past Medical History:  Diagnosis Date  . Anxiety   . Chronic kidney disease 2014   kidney stones  . Clotting disorder ([facility]) 1995   Protien C deficcency  . DVT (deep venous thrombosis) ([facility])   . History of kidney stones   . Pneumothorax     Surgical History: Past Surgical History:  Procedure Laterality Date  . CYSTOSCOPY/URETEROSCOPY/HOLMIUM LASER/STENT PLACEMENT Right 05/15/2017   Procedure: CYSTOSCOPY/URETEROSCOPY/HOLMIUM LASER/STENT PLACEMENT;  Surgeon: [place], Ashley, MD;  Location: [facility] ORS;  Service: Urology;  Laterality: Right;  . TONSILLECTOMY AND ADENOIDECTOMY Bilateral 1995    Home Medications:  Allergies as of 07/12/2017      Reactions   Penicillins Anaphylaxis   Has patient had a PCN reaction causing immediate rash, facial/tongue/throat swelling, SOB or lightheadedness with hypotension: Yes Has patient had a PCN reaction causing severe rash involving mucus membranes or skin necrosis: No Has patient had a PCN reaction that required hospitalization: No Has patient had a PCN reaction occurring within the last 10 years: Yes If all of the above answers are "NO", then may proceed with Cephalosporin use.   Sulfa Antibiotics Rash      Medication List         Accurate as of 07/10/17  9:45 PM. Always use your most recent med list.          celecoxib 200 MG capsule Commonly known as:  CELEBREX TAKE 1 CAPSULE (200 MG TOTAL) BY MOUTH 2 (TWO) TIMES DAILY.   enoxaparin 60 MG/0.6ML injection Commonly known as:  LOVENOX Inject 0.55 mLs (55 mg total) into the skin every 12 (twelve) hours.   ondansetron 4 MG disintegrating tablet Commonly known as:  ZOFRAN ODT Take 1 tablet (4 mg total) by mouth every 8 (eight)  hours as needed for nausea or vomiting.   oxybutynin 5 MG tablet Commonly known as:  DITROPAN Take 1 tablet (5 mg total) by mouth every 8 (eight) hours as needed for bladder spasms.   warfarin 3 MG tablet Commonly known as:  COUMADIN TAKE WITH 5 MG FOR TOTAL DAILY DOSE OF 8 MG   warfarin 2 MG tablet Commonly known as:  COUMADIN TAKE 1 TABLET DAILY ALONG WITH 5 MG TABLET FOR TOTAL DAILY DOSE 7 MG   warfarin 5 MG tablet Commonly known as:  COUMADIN TAKE 1 TABLET (5 MG TOTAL) BY MOUTH DAILY AT 6 PM.       Allergies:  Allergies  Allergen Reactions  . Penicillins Anaphylaxis    Has patient had a PCN reaction causing immediate rash, facial/tongue/throat swelling, SOB or lightheadedness with hypotension: Yes Has patient had a PCN reaction causing severe rash involving mucus membranes or skin necrosis: No Has patient had a PCN reaction that required hospitalization: No Has patient had a PCN reaction occurring within the last 10 years: Yes If all of the above answers are "NO", then may proceed with Cephalosporin use.   . Sulfa Antibiotics Rash    Family History: Family History  Problem Relation Age of Onset  . Hyperlipidemia Mother   . Hypertension Mother   . Hyperlipidemia Father   . Hypertension Father   . Prostate cancer Father 60       prostate   . Stroke Maternal Grandfather   . Cancer Paternal Grandmother 45       breast ca   . Breast cancer Paternal Grandmother   . COPD Paternal Grandfather   . Kidney cancer Neg Hx   . Bladder Cancer Neg Hx     Social History:  reports that she has never smoked. She has never used smokeless tobacco. She reports that she does not drink alcohol or use drugs.  ROS:                                        Physical Exam: There were no vitals taken for this visit.  Constitutional: Well nourished. Alert and oriented, No acute distress. HEENT: [place] AT, moist mucus membranes. Trachea midline, no  masses. Cardiovascular: No clubbing, cyanosis, or edema. Respiratory: Normal respiratory effort, no increased work of breathing. GU: No CVA tenderness.  No bladder fullness or masses.   Skin: No rashes, bruises or suspicious lesions. Lymph: No cervical or inguinal adenopathy. Neurologic: Grossly intact, no focal deficits, moving all 4 extremities. Psychiatric: Normal mood and affect.  Laboratory Data: Lab Results  Component Value Date   WBC 8.2 05/21/2017   HGB 14.9 05/21/2017   HCT 42.7 05/21/2017   MCV 88.7 05/21/2017   PLT 175 05/21/2017    Lab Results  Component Value Date   CREATININE 1.17 (H) 05/21/2017     Lab Results    Component Value Date   TSH 1.36 12/22/2016       Component Value Date/Time   CHOL 187 12/22/2016 0857   HDL 53.40 12/22/2016 0857   CHOLHDL 3 12/22/2016 0857   VLDL 15.2 12/22/2016 0857   LDLCALC 118 (H) 12/22/2016 0857    Lab Results  Component Value Date   AST 22 05/21/2017   Lab Results  Component Value Date   ALT 29 05/21/2017    Urinalysis > 30 RBC's.  See EPIC.    I have reviewed the labs.  Pertinent Imaging: CLINICAL DATA:  History of right ureteral stone.  EXAM: RENAL / URINARY TRACT ULTRASOUND COMPLETE  COMPARISON:  Ultrasound 05/21/2017.  FINDINGS: Right Kidney:  Length: 9.6 cm. Echogenicity within normal limits. No mass. Minimal right hydronephrosis.  Left Kidney:  Length: 9.6 cm. Echogenicity within normal limits. No mass or hydronephrosis visualized.  Bladder:  Appears normal for degree of bladder distention. Bilateral ureteral jets noted.  IMPRESSION: Minimal right hydronephrosis noted on today's exam. Hydronephrosis has improved slightly from prior exam.   Electronically Signed   By: [place]   On: 06/08/2017 15:05  I have independently reviewed the films.    Assessment & Plan:    1. Right ureteral stone  - s/p urs   - given ABC's of Kidney stone prevention  - Encourage  the patient to drink 2.5 L of water daily  - Offered 69-IHWT urine metabolic workup - patient declined   2. Protein C deficiency  - managed by Dr. Derrel Nip   3. Right hydronephrosis  - minimal hydronephrosis seen on recent RUS - instructed the patient to increase her fluids and repeat the study in one month   4. Gross hematuria  - UA today > 30 RBC's - sent for culture as patient is having symptoms  - continue to monitor the patient's UA after the treatment/passage of the stone to ensure the hematuria has resolved  - if hematuria persists, we will pursue a hematuria workup with CT Urogram and cystoscopy if appropriate.  No Follow-up on file.  These notes generated with voice recognition software. I apologize for typographical errors.  Zara Council, [facility] [address], [place], [place] [address] [phone]

## 2017-07-12 ENCOUNTER — Ambulatory Visit: Payer: 59 | Admitting: Urology

## 2017-08-28 ENCOUNTER — Other Ambulatory Visit: Payer: Self-pay | Admitting: Internal Medicine

## 2017-09-06 ENCOUNTER — Ambulatory Visit: Payer: Self-pay | Admitting: General Practice

## 2017-12-07 ENCOUNTER — Other Ambulatory Visit: Payer: Self-pay | Admitting: Internal Medicine

## 2017-12-24 ENCOUNTER — Other Ambulatory Visit: Payer: Self-pay | Admitting: Internal Medicine

## 2018-03-29 ENCOUNTER — Telehealth: Payer: Self-pay

## 2018-03-29 DIAGNOSIS — Z7901 Long term (current) use of anticoagulants: Secondary | ICD-10-CM

## 2018-03-29 NOTE — Addendum Note (Signed)
Addended by: Dennie BibleAVIS, Jaylene Schrom R on: 03/29/2018 12:13 PM   Modules accepted: Orders

## 2018-03-29 NOTE — Telephone Encounter (Signed)
Order in please call and schedule patient.

## 2018-03-29 NOTE — Telephone Encounter (Signed)
Left message to return call, ok for pec to speak to patient and schedule lab appointment 

## 2018-03-29 NOTE — Telephone Encounter (Signed)
Copied from CRM [id number]. Topic: Appointment Scheduling - Scheduling Inquiry for Clinic >> Mar 29, 2018 10:51 AM Cipriano BunkerLambe, Annette S wrote: Reason for CRM: pt wants to schedule for her INR but there is no order Please call and schedule

## 2018-03-30 ENCOUNTER — Other Ambulatory Visit: Payer: Self-pay | Admitting: Internal Medicine

## 2018-04-01 NOTE — Telephone Encounter (Signed)
fyi

## 2018-04-01 NOTE — Telephone Encounter (Signed)
Pt is scheduled for lab on 04/04/2018

## 2018-04-04 ENCOUNTER — Other Ambulatory Visit: Payer: 59

## 2018-04-15 ENCOUNTER — Other Ambulatory Visit (INDEPENDENT_AMBULATORY_CARE_PROVIDER_SITE_OTHER): Payer: 59

## 2018-04-15 DIAGNOSIS — Z7901 Long term (current) use of anticoagulants: Secondary | ICD-10-CM | POA: Diagnosis not present

## 2018-04-15 LAB — PROTIME-INR
INR: 2.6 ratio — ABNORMAL HIGH (ref 0.8–1.0)
PROTHROMBIN TIME: 29.8 s — AB (ref 9.6–13.1)

## 2018-06-08 ENCOUNTER — Other Ambulatory Visit: Payer: Self-pay | Admitting: Internal Medicine

## 2018-06-08 DIAGNOSIS — T45515A Adverse effect of anticoagulants, initial encounter: Secondary | ICD-10-CM

## 2018-06-08 DIAGNOSIS — R319 Hematuria, unspecified: Secondary | ICD-10-CM

## 2018-06-08 DIAGNOSIS — Z0000 Encounter for general adult medical examination without abnormal findings: Secondary | ICD-10-CM

## 2018-06-11 NOTE — Telephone Encounter (Signed)
Refilled: 12/07/2017 Last OV: 05/04/2017 Next OV: not scheduled  Last INR: 04/15/2018

## 2018-06-11 NOTE — Telephone Encounter (Signed)
Lab Results  Component Value Date   INR 2.6 (H) 04/15/2018   INR 2.5 (H) 06/05/2017   INR 5.4 (HH) 05/29/2017   Refilled,  Please contact patient  and set up fasting  lab app and follow up

## 2018-06-13 NOTE — Telephone Encounter (Signed)
Attempted to call pt. No answer no voicemail. PEC may schedule pt for a fasting lab appt and a follow up with Dr. Darrick Huntsman.

## 2018-06-18 NOTE — Telephone Encounter (Signed)
LMTCB. Need to schedule pt for a fasting lab appt and follow up with Dr. Darrick Huntsman. PEC may speak with pt.

## 2018-07-26 ENCOUNTER — Ambulatory Visit (INDEPENDENT_AMBULATORY_CARE_PROVIDER_SITE_OTHER): Payer: 59 | Admitting: Internal Medicine

## 2018-07-26 ENCOUNTER — Other Ambulatory Visit (HOSPITAL_COMMUNITY)
Admission: RE | Admit: 2018-07-26 | Discharge: 2018-07-26 | Disposition: A | Discharge status: Home / Self Care | Payer: 59 | Source: Ambulatory Visit | Attending: Internal Medicine | Admitting: Internal Medicine

## 2018-07-26 ENCOUNTER — Encounter: Payer: Self-pay | Admitting: Internal Medicine

## 2018-07-26 VITALS — BP 102/70 | HR 67 | Temp 98.1°F | Resp 15 | Ht 59.0 in | Wt 120.0 lb

## 2018-07-26 DIAGNOSIS — Z7901 Long term (current) use of anticoagulants: Secondary | ICD-10-CM

## 2018-07-26 DIAGNOSIS — Z0000 Encounter for general adult medical examination without abnormal findings: Secondary | ICD-10-CM

## 2018-07-26 DIAGNOSIS — R5383 Other fatigue: Secondary | ICD-10-CM | POA: Diagnosis not present

## 2018-07-26 DIAGNOSIS — Z23 Encounter for immunization: Secondary | ICD-10-CM

## 2018-07-26 DIAGNOSIS — Z1239 Encounter for other screening for malignant neoplasm of breast: Secondary | ICD-10-CM | POA: Diagnosis not present

## 2018-07-26 DIAGNOSIS — Z124 Encounter for screening for malignant neoplasm of cervix: Secondary | ICD-10-CM | POA: Insufficient documentation

## 2018-07-26 DIAGNOSIS — N132 Hydronephrosis with renal and ureteral calculous obstruction: Secondary | ICD-10-CM

## 2018-07-26 LAB — CBC WITH DIFFERENTIAL/PLATELET
BASOS ABS: 0.1 10*3/uL (ref 0.0–0.1)
Basophils Relative: 1.3 % (ref 0.0–3.0)
Eosinophils Absolute: 0.2 10*3/uL (ref 0.0–0.7)
Eosinophils Relative: 4.1 % (ref 0.0–5.0)
HEMATOCRIT: 46.5 % — AB (ref 36.0–46.0)
Hemoglobin: 15.8 g/dL — ABNORMAL HIGH (ref 12.0–15.0)
LYMPHS ABS: 1.5 10*3/uL (ref 0.7–4.0)
LYMPHS PCT: 34.8 % (ref 12.0–46.0)
MCHC: 33.9 g/dL (ref 30.0–36.0)
MCV: 90 fl (ref 78.0–100.0)
MONOS PCT: 9.9 % (ref 3.0–12.0)
Monocytes Absolute: 0.4 10*3/uL (ref 0.1–1.0)
Neutro Abs: 2.1 10*3/uL (ref 1.4–7.7)
Neutrophils Relative %: 49.9 % (ref 43.0–77.0)
PLATELETS: 161 10*3/uL (ref 150.0–400.0)
RBC: 5.17 Mil/uL — AB (ref 3.87–5.11)
RDW: 13.5 % (ref 11.5–15.5)
WBC: 4.3 10*3/uL (ref 4.0–10.5)

## 2018-07-26 LAB — COMPREHENSIVE METABOLIC PANEL
ALT: 12 U/L (ref 0–35)
AST: 19 U/L (ref 0–37)
Albumin: 4.3 g/dL (ref 3.5–5.2)
Alkaline Phosphatase: 56 U/L (ref 39–117)
BUN: 13 mg/dL (ref 6–23)
CALCIUM: 8.7 mg/dL (ref 8.4–10.5)
CHLORIDE: 104 meq/L (ref 96–112)
CO2: 28 mEq/L (ref 19–32)
CREATININE: 0.96 mg/dL (ref 0.40–1.20)
GFR: 67.22 mL/min (ref >60.00)
Glucose, Bld: 89 mg/dL (ref 70–99)
POTASSIUM: 4.1 meq/L (ref 3.5–5.1)
Sodium: 138 mEq/L (ref 135–145)
Total Bilirubin: 0.3 mg/dL (ref 0.2–1.2)
Total Protein: 6.4 g/dL (ref 6.0–8.3)

## 2018-07-26 LAB — LIPID PANEL
CHOL/HDL RATIO: 4
Cholesterol: 203 mg/dL — ABNORMAL HIGH (ref 0–200)
HDL: 57.6 mg/dL (ref >39.00)
LDL CALC: 124 mg/dL — AB (ref 0–99)
NONHDL: 145.06
Triglycerides: 105 mg/dL (ref 0.0–149.0)
VLDL: 21 mg/dL (ref 0.0–40.0)

## 2018-07-26 LAB — TSH: TSH: 1.07 u[IU]/mL (ref 0.35–4.50)

## 2018-07-26 LAB — PROTIME-INR
INR: 4.3 ratio — AB (ref 0.8–1.0)
PROTHROMBIN TIME: 48.6 s — AB (ref 9.6–13.1)

## 2018-07-26 NOTE — Progress Notes (Signed)
Patient ID: Sabrina Woodard, female    DOB: 1976/08/08  Age: 42 y.o. MRN: [id number]  The patient is here for annual  examination and management of other chronic and acute problems.   The risk factors are reflected in the social history.  The roster of all physicians providing medical care to patient - is listed in the Snapshot section of the chart.  Activities of daily living:  The patient is 100% independent in all ADLs: dressing, toileting, feeding as well as independent mobility  Home safety : The patient has smoke detectors in the home. They wear seatbelts.  There are no firearms at home. There is no violence in the home.   There is no risks for hepatitis, STDs or HIV. There is no   history of blood transfusion. They have no travel history to infectious disease endemic areas of the world.  The patient has seen their dentist in the last six month. They have seen their eye doctor in the last year. They deny hearing  Difficulty and have deferred audiologic testing in the last year.  They do not  have excessive sun exposure. Discussed the need for sun protection: hats, long sleeves and use of sunscreen if there is significant sun exposure.   Diet: the importance of a healthy diet is discussed. They do have a healthy diet.  The benefits of regular aerobic exercise were discussed. Sabrina Woodard exercises  4 times per week , 60 minutes.   Depression screen: there are no signs or vegative symptoms of depression- irritability, change in appetite, anhedonia, sadness/tearfullness.   The following portions of the patient's history were reviewed and updated as appropriate: allergies, current medications, past family history, past medical history,  past surgical history, past social history  and problem list.  Visual acuity was not assessed per patient preference since Sabrina Woodard has regular follow up with Sabrina Woodard ophthalmologist. Hearing and body mass index were assessed and reviewed.   During the course of the visit the  patient was educated and counseled about appropriate screening and preventive services including : fall prevention , diabetes screening, nutrition counseling, colorectal cancer screening, and recommended immunizations.    CC: The primary encounter diagnosis was Cervical cancer screening. Diagnoses of Need for immunization against influenza, Breast cancer screening, Encounter for preventive health examination, Fatigue, unspecified type, Anticoagulation monitoring, INR range 2-3, Encounter for current long-term use of anticoagulants, and Ureteral stone with hydronephrosis were also pertinent to this visit.  History Sabrina Woodard has a past medical history of Anxiety, Chronic kidney disease (2014), Clotting disorder (HCC) (1995), DVT (deep venous thrombosis) (HCC), History of kidney stones, and Pneumothorax.   Sabrina Woodard has a past surgical history that includes Tonsillectomy and adenoidectomy (Bilateral, 1995) and Cystoscopy/ureteroscopy/holmium laser/stent placement (Right, 05/15/2017).   Sabrina Woodard family history includes Breast cancer in Sabrina Woodard paternal grandmother; COPD in Sabrina Woodard paternal grandfather; Cancer (age of onset: 67) in Sabrina Woodard paternal grandmother; Hyperlipidemia in Sabrina Woodard father and mother; Hypertension in Sabrina Woodard father and mother; Prostate cancer (age of onset: 63) in Sabrina Woodard father; Stroke in Sabrina Woodard maternal grandfather.Sabrina Woodard reports that Sabrina Woodard has never smoked. Sabrina Woodard has never used smokeless tobacco. Sabrina Woodard reports that Sabrina Woodard does not drink alcohol or use drugs.  Outpatient Medications Prior to Visit  Medication Sig Dispense Refill  . warfarin (COUMADIN) 2 MG tablet TAKE 1 TABLET DAILY ALONG WITH 5 MG TABLET FOR TOTAL DAILY DOSE 7 MG 90 tablet 1  . warfarin (COUMADIN) 5 MG tablet TAKE 1 TABLET BY MOUTH EVERY DAY AT 6 PM 90 tablet  1  . celecoxib (CELEBREX) 200 MG capsule TAKE 1 CAPSULE (200 MG TOTAL) BY MOUTH 2 (TWO) TIMES DAILY. (Patient not taking: Reported on 06/12/2017) 30 capsule 0  . enoxaparin (LOVENOX) 60 MG/0.6ML injection Inject  0.55 mLs (55 mg total) into the skin every 12 (twelve) hours. (Patient not taking: Reported on 06/12/2017) 8 Syringe 0  . ondansetron (ZOFRAN ODT) 4 MG disintegrating tablet Take 1 tablet (4 mg total) by mouth every 8 (eight) hours as needed for nausea or vomiting. (Patient not taking: Reported on 05/08/2017) 20 tablet 0  . oxybutynin (DITROPAN) 5 MG tablet Take 1 tablet (5 mg total) by mouth every 8 (eight) hours as needed for bladder spasms. (Patient not taking: Reported on 06/12/2017) 30 tablet 0  . warfarin (COUMADIN) 3 MG tablet TAKE WITH 5 MG FOR TOTAL DAILY DOSE OF 8 MG (Patient not taking: Reported on 05/08/2017) 90 tablet 0   No facility-administered medications prior to visit.     Review of Systems   Patient denies headache, fevers, malaise, unintentional weight loss, skin rash, eye pain, sinus congestion and sinus pain, sore throat, dysphagia,  hemoptysis , cough, dyspnea, wheezing, chest pain, palpitations, orthopnea, edema, abdominal pain, nausea, melena, diarrhea, constipation, flank pain, dysuria, hematuria, urinary  Frequency, nocturia, numbness, tingling, seizures,  Focal weakness, Loss of consciousness,  Tremor, insomnia, depression, anxiety, and suicidal ideation.      Objective:  BP 102/70 (BP Location: Left Arm, Patient Position: Sitting, Cuff Size: Normal)   Pulse 67   Temp 98.1 F (36.7 C) (Oral)   Resp 15   Ht 4\' 11"  (1.499 m)   Wt 120 lb (54.4 kg)   SpO2 98%   BMI 24.24 kg/m   Physical Exam   General Appearance:    Alert, cooperative, no distress, appears stated age  Head:    Normocephalic, without obvious abnormality, atraumatic  Eyes:    PERRL, conjunctiva/corneas clear, EOM's intact, fundi    benign, both eyes  Ears:    Normal TM's and external ear canals, both ears  Nose:   Nares normal, septum midline, mucosa normal, no drainage    or sinus tenderness  Throat:   Lips, mucosa, and tongue normal; teeth and gums normal  Neck:   Supple, symmetrical, trachea  midline, no adenopathy;    thyroid:  no enlargement/tenderness/nodules; no carotid   bruit or JVD  Back:     Symmetric, no curvature, ROM normal, no CVA tenderness  Lungs:     Clear to auscultation bilaterally, respirations unlabored  Chest Wall:    No tenderness or deformity   Heart:    Regular rate and rhythm, S1 and S2 normal, no murmur, rub   or gallop  Breast Exam:    No tenderness, masses, or nipple abnormality  Abdomen:     Soft, non-tender, bowel sounds active all four quadrants,    no masses, no organomegaly  Genitalia:    Pelvic: cervix normal in appearance, external genitalia normal, no adnexal masses or tenderness, no cervical motion tenderness, rectovaginal septum normal, uterus normal size, shape, and consistency and vagina normal without discharge  Extremities:   Extremities normal, atraumatic, no cyanosis or edema  Pulses:   2+ and symmetric all extremities  Skin:   Skin color, texture, turgor normal, no rashes or lesions  Lymph nodes:   Cervical, supraclavicular, and axillary nodes normal  Neurologic:   CNII-XII intact, normal strength, sensation and reflexes    throughout      Assessment & Plan:  Problem List Items Addressed This Visit    Breast cancer screening   Relevant Orders   MM 3D SCREEN BREAST BILATERAL   Encounter for preventive health examination    Annual comprehensive preventive exam was done as well as an evaluation and management of chronic conditions .  During the course of the visit the patient was educated and counseled about appropriate screening and preventive services including :  diabetes screening, lipid analysis with projected  10 year  risk for CAD , nutrition counseling, breast, cervical and colorectal cancer screening, and recommended immunizations.  Printed recommendations for health maintenance screenings was given      Relevant Orders   Comprehensive metabolic panel (Completed)   HIV Antibody (routine testing w rflx) (Completed)    Lipid panel (Completed)   Ureteral stone with hydronephrosis    S/p stent placement iN Sept 2018 with premature removal.  Reviewed Urology notes;  Repeat CT scan was recommended at one month to ensure resolution of hydronephrosis but not done .  CR has improved from mild elevation noted last year.   Lab Results  Component Value Date   CREATININE 0.96 07/26/2018          Other Visit Diagnoses    Cervical cancer screening    -  Primary   Relevant Orders   Cytology - PAP   Need for immunization against influenza       Relevant Orders   Flu Vaccine QUAD 36+ mos IM (Completed)   Fatigue, unspecified type       Relevant Orders   TSH (Completed)   Anticoagulation monitoring, INR range 2-3       Relevant Orders   Protime-INR (Completed)   Encounter for current long-term use of anticoagulants       Relevant Orders   CBC with Differential/Platelet (Completed)      I have discontinued Sabrina Woodard's ondansetron, enoxaparin, oxybutynin, and celecoxib. I am also having Sabrina Woodard maintain Sabrina Woodard warfarin and warfarin.  No orders of the defined types were placed in this encounter.   Medications Discontinued During This Encounter  Medication Reason  . celecoxib (CELEBREX) 200 MG capsule Error  . enoxaparin (LOVENOX) 60 MG/0.6ML injection Error  . ondansetron (ZOFRAN ODT) 4 MG disintegrating tablet Error  . oxybutynin (DITROPAN) 5 MG tablet Error  . warfarin (COUMADIN) 3 MG tablet Error    Follow-up: No follow-ups on file.   Sherlene Shams, MD

## 2018-07-26 NOTE — Patient Instructions (Signed)
Your annual mammogram has been ordered.  You are encouraged (required) to call to make your appointment at [facility]   Preventive Care 40-64 Years, Female Preventive care refers to lifestyle choices and visits with your health care provider that can promote health and wellness. What does preventive care include?  A yearly physical exam. This is also called an annual well check.  Dental exams once or twice a year.  Routine eye exams. Ask your health care provider how often you should have your eyes checked.  Personal lifestyle choices, including: ? Daily care of your teeth and gums. ? Regular physical activity. ? Eating a healthy diet. ? Avoiding tobacco and drug use. ? Limiting alcohol use. ? Practicing safe sex. ? Taking low-dose aspirin daily starting at age 28. ? Taking vitamin and mineral supplements as recommended by your health care provider. What happens during an annual well check? The services and screenings done by your health care provider during your annual well check will depend on your age, overall health, lifestyle risk factors, and family history of disease. Counseling Your health care provider may ask you questions about your:  Alcohol use.  Tobacco use.  Drug use.  Emotional well-being.  Home and relationship well-being.  Sexual activity.  Eating habits.  Work and work Statistician.  Method of birth control.  Menstrual cycle.  Pregnancy history.  Screening You may have the following tests or measurements:  Height, weight, and BMI.  Blood pressure.  Lipid and cholesterol levels. These may be checked every 5 years, or more frequently if you are over 42 years old.  Skin check.  Lung cancer screening. You may have this screening every year starting at age 50 if you have a 30-pack-year history of smoking and currently smoke or have quit within the past 15 years.  Fecal occult blood test (FOBT) of the stool. You may have this test  every year starting at age 55.  Flexible sigmoidoscopy or colonoscopy. You may have a sigmoidoscopy every 5 years or a colonoscopy every 10 years starting at age 33.  Hepatitis C blood test.  Hepatitis B blood test.  Sexually transmitted disease (STD) testing.  Diabetes screening. This is done by checking your blood sugar (glucose) after you have not eaten for a while (fasting). You may have this done every 1-3 years.  Mammogram. This may be done every 1-2 years. Talk to your health care provider about when you should start having regular mammograms. This may depend on whether you have a family history of breast cancer.  BRCA-related cancer screening. This may be done if you have a family history of breast, ovarian, tubal, or peritoneal cancers.  Pelvic exam and Pap test. This may be done every 3 years starting at age 36. Starting at age 31, this may be done every 5 years if you have a Pap test in combination with an HPV test.  Bone density scan. This is done to screen for osteoporosis. You may have this scan if you are at high risk for osteoporosis.  Discuss your test results, treatment options, and if necessary, the need for more tests with your health care provider. Vaccines Your health care provider may recommend certain vaccines, such as:  Influenza vaccine. This is recommended every year.  Tetanus, diphtheria, and acellular pertussis (Tdap, Td) vaccine. You may need a Td booster every 10 years.  Varicella vaccine. You may need this if you have not been vaccinated.  Zoster vaccine. You may need this  after age 67.  Measles, mumps, and rubella (MMR) vaccine. You may need at least one dose of MMR if you were born in 1957 or later. You may also need a second dose.  Pneumococcal 13-valent conjugate (PCV13) vaccine. You may need this if you have certain conditions and were not previously vaccinated.  Pneumococcal polysaccharide (PPSV23) vaccine. You may need one or two doses if you  smoke cigarettes or if you have certain conditions.  Meningococcal vaccine. You may need this if you have certain conditions.  Hepatitis A vaccine. You may need this if you have certain conditions or if you travel or work in places where you may be exposed to hepatitis A.  Hepatitis B vaccine. You may need this if you have certain conditions or if you travel or work in places where you may be exposed to hepatitis B.  Haemophilus influenzae type b (Hib) vaccine. You may need this if you have certain conditions.  Talk to your health care provider about which screenings and vaccines you need and how often you need them. This information is not intended to replace advice given to you by your health care provider. Make sure you discuss any questions you have with your health care provider. Document Released: 10/01/2015 Document Revised: 05/24/2016 Document Reviewed: 07/06/2015 Elsevier Interactive Patient Education  [employer].

## 2018-07-27 LAB — HIV ANTIBODY (ROUTINE TESTING W REFLEX): HIV 1&2 Ab, 4th Generation: NONREACTIVE

## 2018-07-28 MED ORDER — WARFARIN SODIUM 1 MG PO TABS: 1.0000 mg | ORAL_TABLET | Freq: Every day | ORAL | 0 refills | Status: DC

## 2018-07-28 NOTE — Assessment & Plan Note (Addendum)
S/p stent placement iN Sept 2018 with premature removal.  Reviewed Urology notes;  Repeat CT scan was recommended at one month to ensure resolution of hydronephrosis but not done .  CR has improved from mild elevation noted last year.   Lab Results  Component Value Date   CREATININE 0.96 07/26/2018

## 2018-07-28 NOTE — Assessment & Plan Note (Signed)
Annual comprehensive preventive exam was done as well as an evaluation and management of chronic conditions .  During the course of the visit the patient was educated and counseled about appropriate screening and preventive services including :  diabetes screening, lipid analysis with projected  10 year  risk for CAD , nutrition counseling, breast, cervical and colorectal cancer screening, and recommended immunizations.  Printed recommendations for health maintenance screenings was given 

## 2018-07-29 ENCOUNTER — Encounter: Payer: Self-pay | Admitting: *Deleted

## 2018-07-29 ENCOUNTER — Telehealth: Payer: Self-pay

## 2018-07-29 NOTE — Telephone Encounter (Signed)
See previous message

## 2018-07-29 NOTE — Telephone Encounter (Signed)
Spoke with Sabrina Woodard and she stated that she had to send back sample for proper labeling. Once sample returns will get labeled and sent back.

## 2018-07-29 NOTE — Telephone Encounter (Signed)
Copied from CRM [id number]. Topic: General - Inquiry >> Jul 29, 2018  9:33 AM Windy Kalata, NT wrote: Reason for CRM: Malachi Bonds with the Cytology dept ask that Dr. Darrick Huntsman nurse give her a call back. She states she called on Friday but never received a call back.   [phone]   **Please advise?**

## 2018-07-29 NOTE — Telephone Encounter (Signed)
Copied from CRM [id number]. Topic: General - Inquiry >> Jul 29, 2018  9:33 AM Windy Kalata, NT wrote: Reason for CRM: Malachi Bonds with the Cytology dept ask that Dr. Darrick Huntsman nurse give her a call back. She states she called on Friday but never received a call back.   [phone]   **I spoke with Malachi Bonds & she wanted be to let you know that they eceived paperwork & vial from lab was not labeled with patient's name. So had to be sent back to [place] for verification. She wanted Dr. Darrick Huntsman to be aware.**

## 2018-07-30 LAB — CYTOLOGY - PAP
Diagnosis: NEGATIVE
HPV: NOT DETECTED

## 2018-08-22 ENCOUNTER — Ambulatory Visit
Admission: RE | Admit: 2018-08-22 | Discharge: 2018-08-22 | Disposition: A | Discharge status: Home / Self Care | Payer: 59 | Source: Ambulatory Visit | Attending: Internal Medicine | Admitting: Internal Medicine

## 2018-08-22 DIAGNOSIS — Z1239 Encounter for other screening for malignant neoplasm of breast: Secondary | ICD-10-CM | POA: Diagnosis not present

## 2018-09-18 IMAGING — CT CT RENAL STONE PROTOCOL
2 of 4 series · 16 of 46 positions shown, 18 images · non-contrast
Comparison: CT abdomen pelvis 03/30/2017

CLINICAL DATA: Right lower quadrant abdominal pain and flank pain.
Gross hematuria.

EXAM:
CT ABDOMEN AND PELVIS WITHOUT CONTRAST
TECHNIQUE: Multidetector CT imaging of the abdomen and pelvis was performed
following the standard protocol without IV contrast.

[Series 3: axial st · axial · 0.76mm/px · z∈[-837,-511]mm · 13 of 81 slices shown, 15 images]
[im 4/81  soft-tissue]
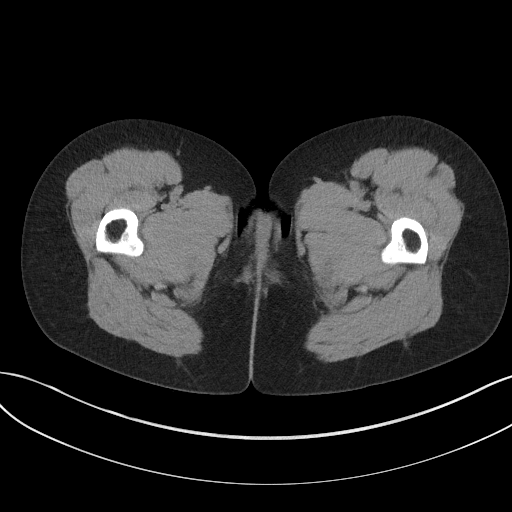
[im 4/81  bone]
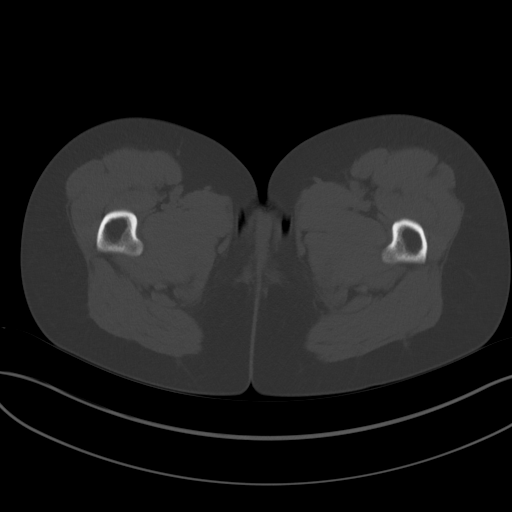
[im 10/81  soft-tissue]
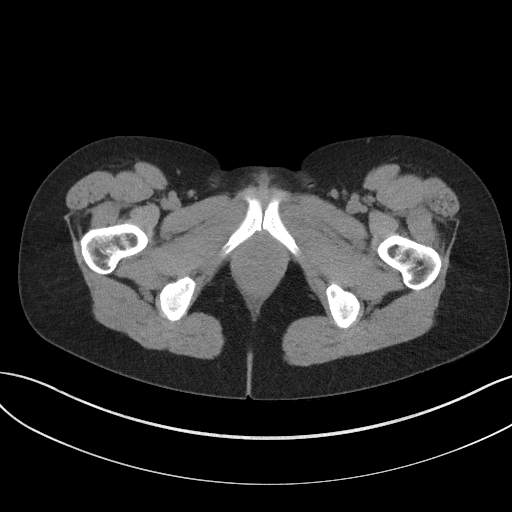
[im 16/81  soft-tissue]
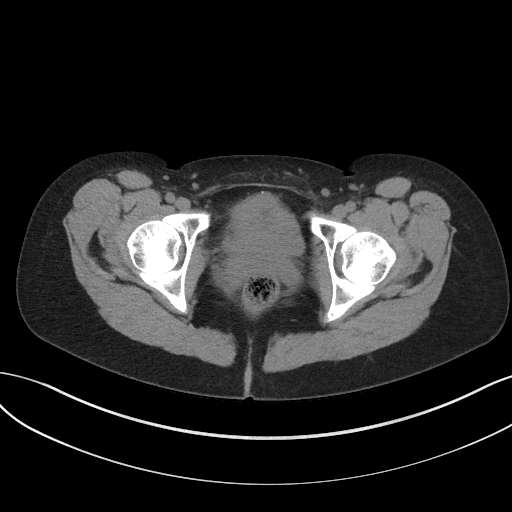
[im 22/81  soft-tissue]
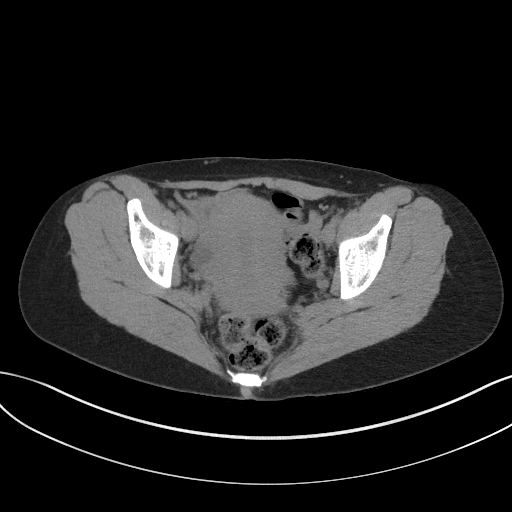
[im 28/81  soft-tissue]
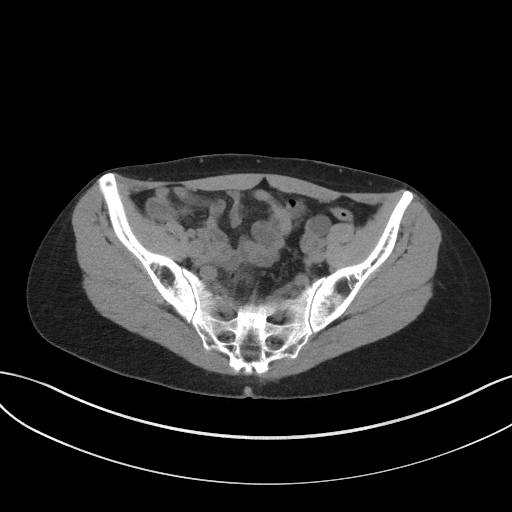
[im 34/81  soft-tissue]
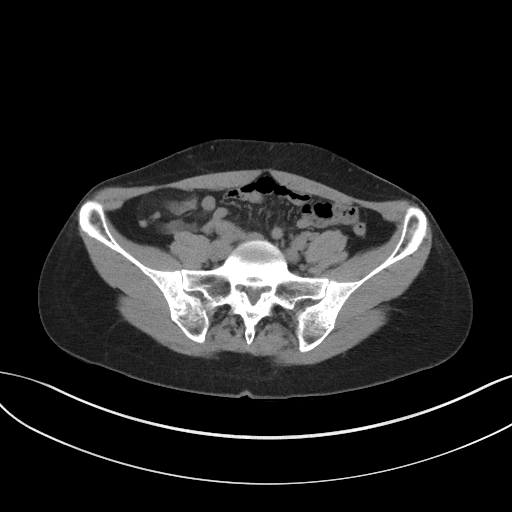
[im 41/81  soft-tissue]
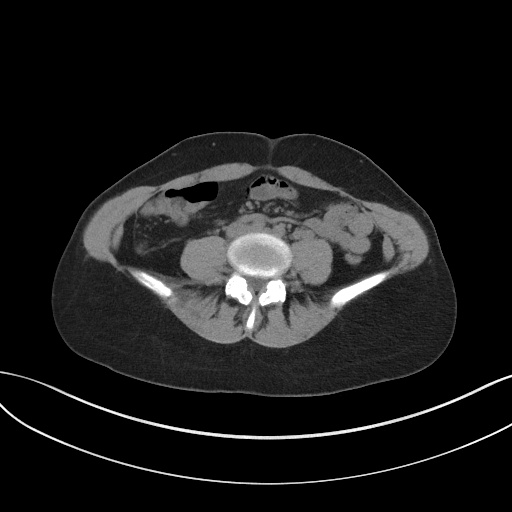
[im 47/81  soft-tissue]
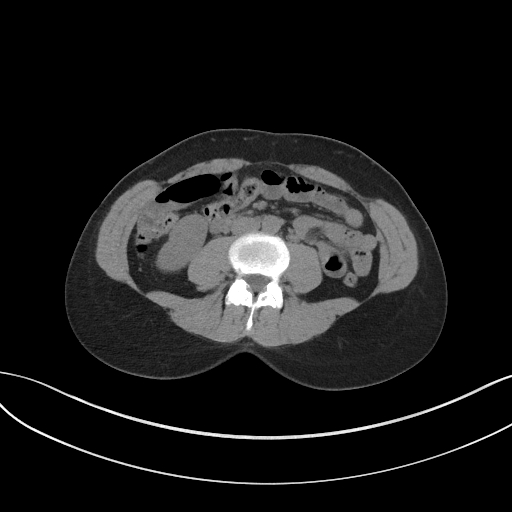
[im 53/81  soft-tissue]
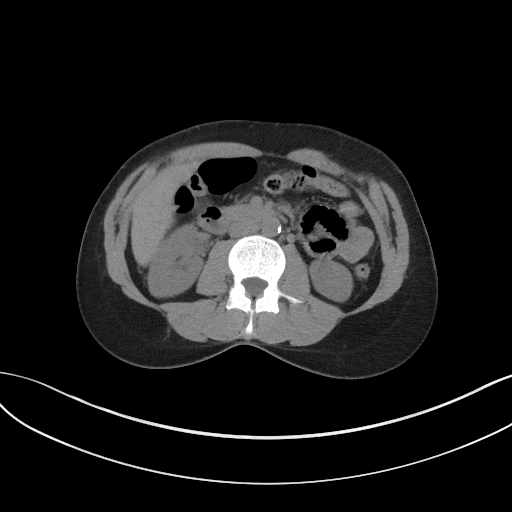
[im 53/81  bone]
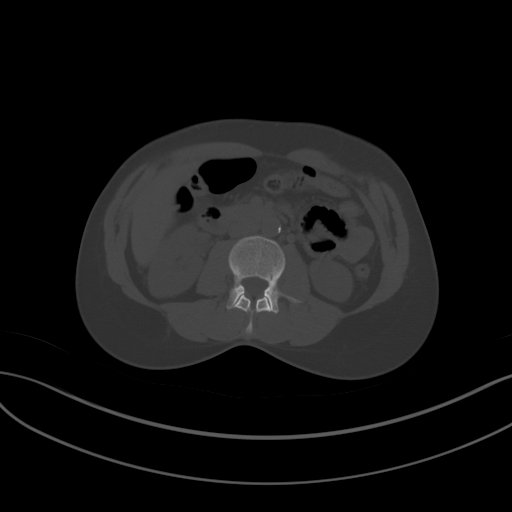
[im 59/81  soft-tissue]
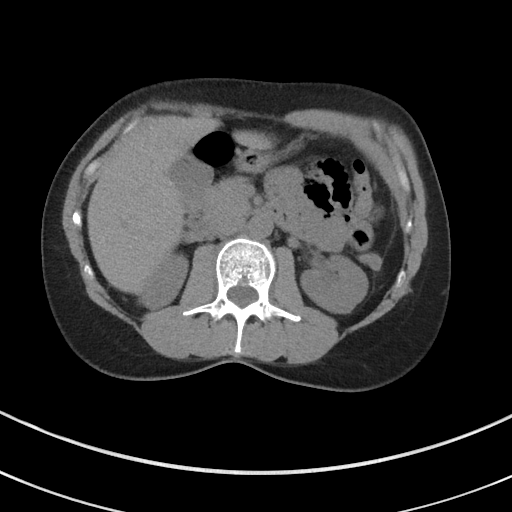
[im 65/81  soft-tissue]
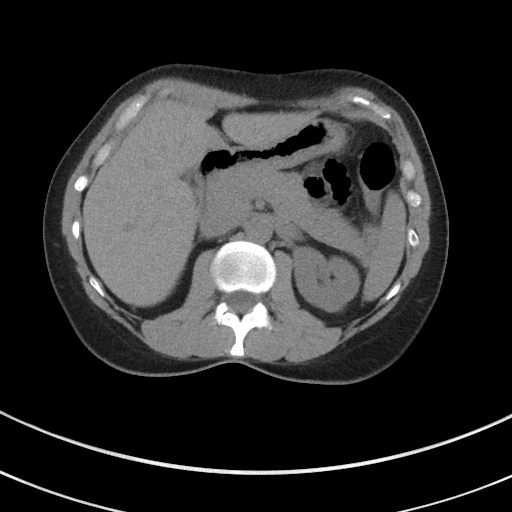
[im 71/81  soft-tissue]
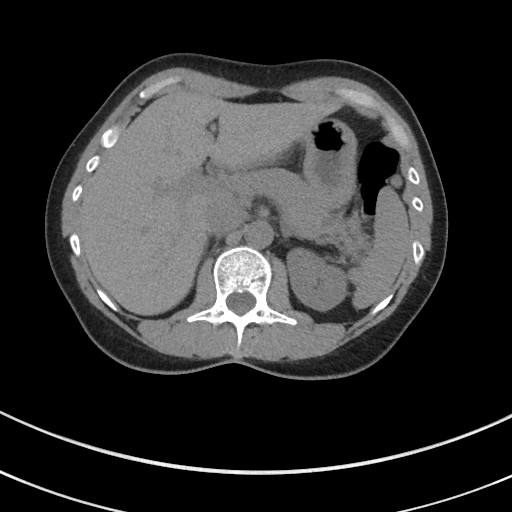
[im 77/81  soft-tissue]
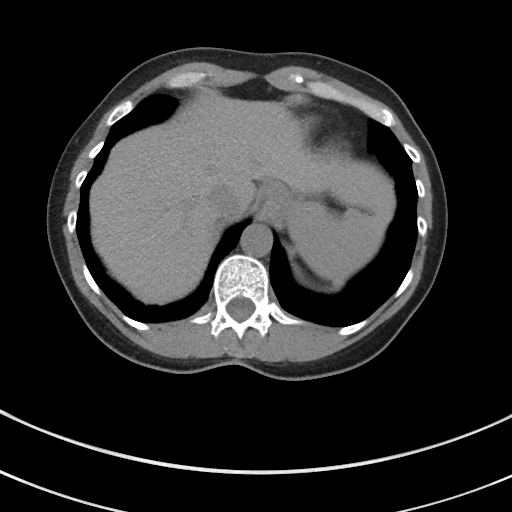

[Series 602: coronal st · coronal · 0.76mm/px · 3 of 109 slices shown]
[im 37/109  soft-tissue]
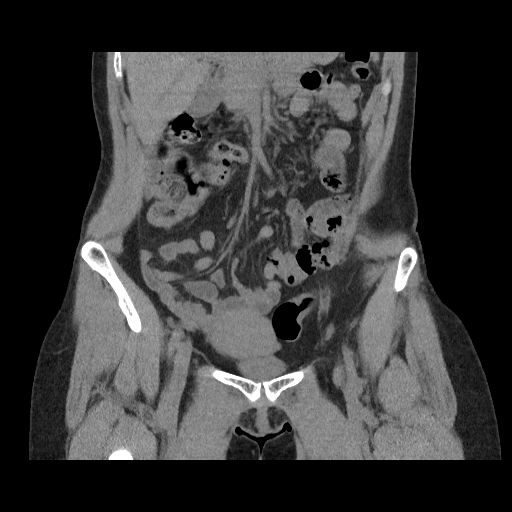
[im 49/109  soft-tissue]
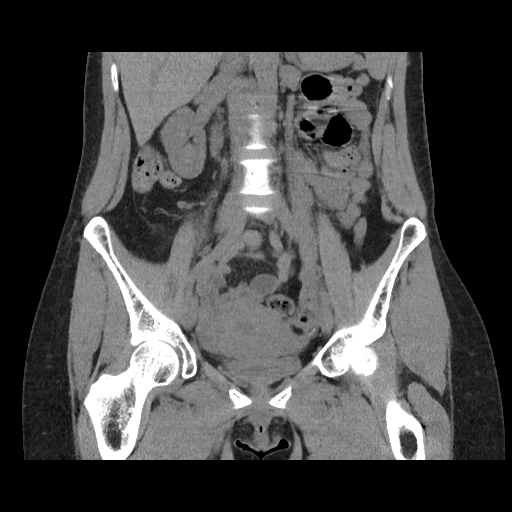
[im 61/109  soft-tissue]
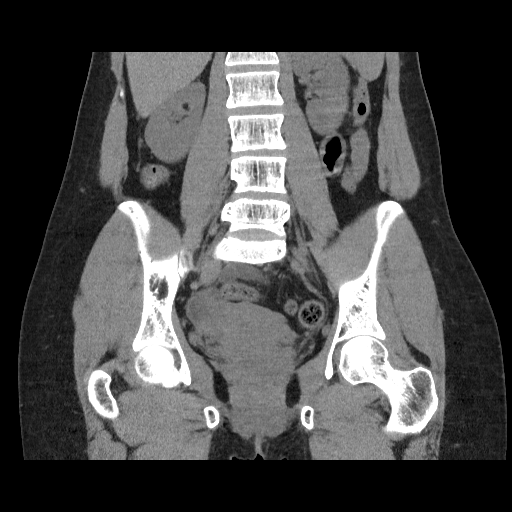

[16 of 46 positions shown; findings below may reference images not displayed]

FINDINGS: Lower chest: No pulmonary nodules or pleural effusion. No visible
pericardial effusion.

Hepatobiliary: Normal hepatic contours and density. No visible
biliary dilatation. Normal gallbladder.

Pancreas: Normal contours without ductal dilatation. No
peripancreatic fluid collection.

Spleen: Normal.

Adrenals/Urinary Tract:

--Adrenal glands: Normal.

--Right kidney/ureter: There is a 6 x 6 x 3 mm stone at the right
ureteropelvic junction. The appearance of the stone is unchanged,
but it actually appears more proximal than on the prior study. There
is mild hydronephrosis. No perinephric stranding. No other
nephrolithiasis. The remainder of the right ureter is normal.

--Left kidney/ureter: No hydronephrosis or perinephric stranding. No
nephrolithiasis. No obstructing ureteral stones.

--Urinary bladder: Unremarkable.

Stomach/Bowel:

--Stomach/Duodenum: No hiatal hernia or other gastric abnormality.
Normal duodenal course and caliber.

--Small bowel: No dilatation or inflammation.

--Colon: No focal abnormality.

--Appendix: Normal.

Vascular/Lymphatic: Atherosclerotic calcification is present within
the non-aneurysmal abdominal aorta, without hemodynamically
significant stenosis. No abdominal or pelvic lymphadenopathy.

Reproductive: Normal uterus and ovaries.

Musculoskeletal. No bony spinal canal stenosis or focal osseous
abnormality.

Other: None.
IMPRESSION: 1. Right-sided obstructive uropathy with 6 x 6 x 3 mm ureteropelvic
junction stone causing mild hydronephrosis. The stone appears
slightly more proximal than on the prior study.
2. No other nephroureterolithiasis.
3. Minimal Aortic Atherosclerosis ([id number]).

## 2018-09-24 ENCOUNTER — Other Ambulatory Visit: Payer: Self-pay

## 2018-09-24 ENCOUNTER — Ambulatory Visit
Admission: EM | Admit: 2018-09-24 | Discharge: 2018-09-24 | Disposition: A | Discharge status: Home / Self Care | Payer: 59 | Attending: Family Medicine | Admitting: Family Medicine

## 2018-09-24 ENCOUNTER — Encounter: Payer: Self-pay | Admitting: Emergency Medicine

## 2018-09-24 ENCOUNTER — Ambulatory Visit: Payer: Self-pay

## 2018-09-24 ENCOUNTER — Telehealth: Payer: Self-pay | Admitting: Internal Medicine

## 2018-09-24 ENCOUNTER — Ambulatory Visit: Payer: 59 | Admitting: Family Medicine

## 2018-09-24 ENCOUNTER — Ambulatory Visit (INDEPENDENT_AMBULATORY_CARE_PROVIDER_SITE_OTHER): Payer: 59

## 2018-09-24 DIAGNOSIS — Z86718 Personal history of other venous thrombosis and embolism: Secondary | ICD-10-CM

## 2018-09-24 DIAGNOSIS — M79662 Pain in left lower leg: Secondary | ICD-10-CM

## 2018-09-24 DIAGNOSIS — D6859 Other primary thrombophilia: Secondary | ICD-10-CM

## 2018-09-24 DIAGNOSIS — I82402 Acute embolism and thrombosis of unspecified deep veins of left lower extremity: Secondary | ICD-10-CM | POA: Diagnosis not present

## 2018-09-24 LAB — PROTIME-INR
INR: 1.58
Prothrombin Time: 18.7 seconds — ABNORMAL HIGH (ref 11.4–15.2)

## 2018-09-24 MED ORDER — ENOXAPARIN SODIUM 100 MG/ML ~~LOC~~ SOLN: 1.0000 mg/kg | Freq: Two times a day (BID) | SUBCUTANEOUS | 0 refills | Status: DC

## 2018-09-24 NOTE — Discharge Instructions (Signed)
Follow up with Dr. Darrick Huntsmanullo (PCP) in the next 5-7 days

## 2018-09-24 NOTE — Telephone Encounter (Signed)
Talked with dr. Lorin Picket she advised patient needs to go to [facility] or ED , called patient she is going to [facility] now. FYI

## 2018-09-24 NOTE — Telephone Encounter (Signed)
Patient does have a DVT .Marland Kitchen  Dr Judd Gaudier is starting her on Lovenox injections,  She needs to follow up with me in the next 5 days to discuss alternatives to coumadin

## 2018-09-24 NOTE — Telephone Encounter (Signed)
[facility] scheduled patient with NP for 1540 today, for possible DVT missed coumadin dose, ED?

## 2018-09-24 NOTE — Telephone Encounter (Signed)
Scheduled patient for Friday at 1630.

## 2018-09-24 NOTE — ED Provider Notes (Signed)
[facility]    CSN: [id number] Arrival date & time: 09/24/18  1147     History   Chief Complaint Chief Complaint  Patient presents with  . Leg Pain    left    HPI Sabrina WORRALL is a 43 y.o. female.   43 yo female with a c/o left lower leg pain for the past 2-3 weeks on and off, however worse over the last 4 days. Denies any injuries, falls, fevers, chills, rash, chest pains, shortness of breath. Patient has a h/o protein C deficiency, h/o prior DVT and PE.   The history is provided by the patient.  Leg Pain    Past Medical History:  Diagnosis Date  . Anxiety   . Chronic kidney disease 2014   kidney stones  . Clotting disorder (HCC) 1995   Protien C deficcency  . DVT (deep venous thrombosis) (HCC)   . History of kidney stones   . Pneumothorax     Patient Active Problem List   Diagnosis Date Noted  . Ureteral stone with hydronephrosis 05/04/2017  . Osteopenia of spine 12/24/2016  . Encounter for screening for cervical cancer  05/23/2015  . Encounter for preventive health examination 04/25/2015  . History of pulmonary embolism 07/21/2013  . 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 Breast cancer screening 07/21/2013    Past Surgical History:  Procedure Laterality Date  . CYSTOSCOPY/URETEROSCOPY/HOLMIUM LASER/STENT PLACEMENT Right 05/15/2017   Procedure: CYSTOSCOPY/URETEROSCOPY/HOLMIUM LASER/STENT PLACEMENT;  Surgeon: [name], MD;  Location: [facility] ORS;  Service: Urology;  Laterality: Right;  . TONSILLECTOMY AND ADENOIDECTOMY Bilateral 1995    OB History   No obstetric history on file.      Home Medications    Prior to Admission medications   Medication Sig Start Date End Date Taking? Authorizing Provider  warfarin (COUMADIN) 1 MG tablet Take 1 tablet (1 mg total) by mouth daily at 6 PM. Take with 5 mg daily 07/28/18  Yes Sherlene Shams, MD  warfarin (COUMADIN) 5 MG tablet TAKE 1 TABLET BY MOUTH EVERY DAY AT 6 PM 06/11/18  Yes Sherlene Shams, MD  enoxaparin (LOVENOX) 100 MG/ML injection Inject 0.55 mLs (55 mg total) into the skin every 12 (twelve) hours for 7 days. 09/24/18 10/01/18  Payton Mccallum, MD    Family History Family History  Problem Relation Age of Onset  . Hyperlipidemia Mother   . Hypertension Mother   . Hyperlipidemia Father   . Hypertension Father   . Prostate cancer Father 38       prostate   . Stroke Maternal Grandfather   . Cancer Paternal Grandmother 51       breast ca   . Breast cancer Paternal Grandmother   . COPD Paternal Grandfather   . Kidney cancer Neg Hx   . Bladder Cancer Neg Hx     Social History Social History   Tobacco Use  . Smoking status: Never Smoker  . Smokeless tobacco: Never Used  Substance Use Topics  . Alcohol use: No  . Drug use: No     Allergies   Penicillins and Sulfa antibiotics   Review of Systems Review of Systems   Physical Exam Triage Vital Signs ED Triage Vitals  Enc Vitals Group     BP 09/24/18 1239 123/85     Pulse Rate 09/24/18 1239 69     Resp 09/24/18 1239 16     Temp 09/24/18 1239 99.2 F (37.3 C)     Temp  Source 09/24/18 1239 Oral     SpO2 09/24/18 1239 100 %     Weight 09/24/18 1238 120 lb (54.4 kg)     Height 09/24/18 1238 4\' 11"  (1.499 m)     Head Circumference --      Peak Flow --      Pain Score 09/24/18 1238 3     Pain Loc --      Pain Edu? --      Excl. in GC? --    No data found.  Updated Vital Signs BP 123/85 (BP Location: Left Arm)   Pulse 69   Temp 99.2 F (37.3 C) (Oral)   Resp 16   Ht 4\' 11"  (1.499 m)   Wt 54.4 kg   LMP 09/09/2018 (Exact Date)   SpO2 100%   BMI 24.24 kg/m   Visual Acuity Right Eye Distance:   Left Eye Distance:   Bilateral Distance:    Right Eye Near:   Left Eye Near:    Bilateral Near:     Physical Exam Vitals signs and nursing note reviewed.  Constitutional:      General: She is not in acute distress.    Appearance: Normal appearance. She is not ill-appearing,  toxic-appearing or diaphoretic.  Cardiovascular:     Rate and Rhythm: Normal rate.  Pulmonary:     Effort: Pulmonary effort is normal. No respiratory distress.     Breath sounds: No stridor.  Musculoskeletal:     Left lower leg: She exhibits tenderness (over the posterior leg (calf)). She exhibits no bony tenderness, no swelling, no deformity and no laceration. No edema.  Neurological:     Mental Status: She is alert.      UC Treatments / Results  Labs (all labs ordered are listed, but only abnormal results are displayed) Labs Reviewed  PROTIME-INR - Abnormal; Notable for the following components:      Result Value   Prothrombin Time 18.7 (*)    All other components within normal limits    EKG None  Radiology [place] Venous Img Lower Unilateral Left  Result Date: 09/24/2018 CLINICAL DATA:  History of DVT and protein C deficiency, now with left lower extremity pain. Evaluate for acute or chronic DVT. EXAM: LEFT LOWER EXTREMITY VENOUS DOPPLER ULTRASOUND TECHNIQUE: Gray-scale sonography with graded compression, as well as color Doppler and duplex ultrasound were performed to evaluate the lower extremity deep venous systems from the level of the common femoral vein and including the common femoral, femoral, profunda femoral, popliteal and calf veins including the posterior tibial, peroneal and gastrocnemius veins when visible. The superficial great saphenous vein was also interrogated. Spectral Doppler was utilized to evaluate flow at rest and with distal augmentation maneuvers in the common femoral, femoral and popliteal veins. COMPARISON:  Left lower extremity Doppler ultrasound - 05/08/2011 FINDINGS: Contralateral Common Femoral Vein: Respiratory phasicity is normal and symmetric with the symptomatic side. No evidence of thrombus. Normal compressibility. Common Femoral Vein: No evidence of acute or chronic thrombus. Normal compressibility, respiratory phasicity and response to augmentation.  Saphenofemoral Junction: No evidence of acute or chronic thrombus. Normal compressibility and flow on color Doppler imaging. Profunda Femoral Vein: No evidence of acute or chronic thrombus. Normal compressibility and flow on color Doppler imaging. Femoral Vein: There is mixed echogenic occlusive DVT within one of the paired distal left femoral veins (images 28 through 32). The adjacent paired femoral vein appears widely patent. Popliteal Vein: There is mixed echogenic near occlusive DVT involving the left  popliteal vein (images 36 through 43). Calf Veins: There is mixed echogenic occlusive DVT involving one of the paired left peroneal veins (image 50). The posterior tibial veins appear patent where imaged. Superficial Great Saphenous Vein: No evidence of thrombus. Normal compressibility. Venous Reflux:  None. Other Findings:  None. IMPRESSION: Examination is positive for age-indeterminate occlusive DVT involving the distal aspect of one of the paired left femoral veins, the left popliteal vein as well as one of the paired left peroneal veins. DVT with seen at these locations on remote venous Doppler ultrasound performed 04/2011, though given lack of more recent interval comparison examinations, an acute on chronic process is not excluded. Clinical correlation is advised. Electronically Signed   By: Simonne ComeJohn  Watts M.D.   On: 09/24/2018 14:42    Procedures Procedures (including critical care time)  Medications Ordered in UC Medications - No data to display  Initial Impression / Assessment and Plan / UC Course  I have reviewed the triage vital signs and the nursing notes.  Pertinent labs & imaging results that were available during my care of the patient were reviewed by me and considered in my medical decision making (see chart for details).      Final Clinical Impressions(s) / UC Diagnoses   Final diagnoses:  DVT, recurrent, lower extremity, acute, left [facility](HCC)     Discharge Instructions       Follow up with Dr. Darrick Huntsmanullo (PCP) in the next 5-7 days    ED Prescriptions    Medication Sig Dispense Auth. Provider   enoxaparin (LOVENOX) 100 MG/ML injection Inject 0.55 mLs (55 mg total) into the skin every 12 (twelve) hours for 7 days. 7.7 mL Payton Mccallumonty, Chasty Randal, MD     1. US results and diagnosis reviewed with patient 2. rx as per orders above; reviewed possible side effects, interactions, risks and benefits  3. Recommend continue warfarin for now 4. Follow up with PCP in next 5-7 days for further evaluation and management 5. Follow-up prn if symptoms worsen or don't improve   Controlled Substance Prescriptions [place] Controlled Substance Registry consulted? Not Applicable   Payton Mccallumonty, Catina Nuss, MD 09/24/18 1740

## 2018-09-24 NOTE — Telephone Encounter (Signed)
Pt. Reports she started noticing some slight pain and swelling to left calf the beginning of December. Would notice it and then it would "go away." Is on Coumadin for h/o PE and DVT per pt. Reports she had a stomach virus recently and missed "a few doses of her Coumadin." Left calf feels heavy and she notices swelling and pain during the day. No chest pain or shortness of breath. Appointment made for today. Appointment made for today. No availability with Dr. Darrick Huntsman.Instructed if she develops chest pain or shortness of breath to go to ED. Verbalizes understanding.  Reason for Disposition . [1] MODERATE pain (e.g., interferes with normal activities, limping) AND [2] present > 3 days  Answer Assessment - Initial Assessment Questions 1. ONSET: "When did the pain start?"      Beginning of December 2. LOCATION: "Where is the pain located?"      Left leg - calf muscle 3. PAIN: "How bad is the pain?"    (Scale 1-10; or mild, moderate, severe)   -  MILD (1-3): doesn't interfere with normal activities    -  MODERATE (4-7): interferes with normal activities (e.g., work or school) or awakens from sleep, limping    -  SEVERE (8-10): excruciating pain, unable to do any normal activities, unable to walk     5 4. WORK OR EXERCISE: "Has there been any recent work or exercise that involved this part of the body?"      No 5. CAUSE: "What do you think is causing the leg pain?"     Maybe a blood clot 6. OTHER SYMPTOMS: "Do you have any other symptoms?" (e.g., chest pain, back pain, breathing difficulty, swelling, rash, fever, numbness, weakness)     Heaviness in my leg, swelling - ankle and calf. No redness 7. PREGNANCY: "Is there any chance you are pregnant?" "When was your last menstrual period?"     Yes - having her period  Protocols used: LEG PAIN-A-AH

## 2018-09-24 NOTE — ED Triage Notes (Signed)
Patient in today c/o left leg pain x 4 days. Patient sent a message to Dr. Darrick Huntsman and was advised to come to [facility] for possible DVT. Patient does take Warfarin for Protein C Def.

## 2018-09-26 NOTE — Assessment & Plan Note (Addendum)
With recurrent DVT/PE during coumadin lapses.  Currently with DVT treated with Lovenox starting jan 7 .  Will again discuss the option of transitioning from coumadin to a DOAC for long term use given patient's nonadherence with monthly INR surveillance.

## 2018-09-27 ENCOUNTER — Ambulatory Visit (INDEPENDENT_AMBULATORY_CARE_PROVIDER_SITE_OTHER): Payer: 59 | Admitting: Internal Medicine

## 2018-09-27 ENCOUNTER — Encounter: Payer: Self-pay | Admitting: Internal Medicine

## 2018-09-27 VITALS — BP 116/84 | HR 55 | Temp 98.0°F | Resp 14 | Ht 59.0 in | Wt 116.0 lb

## 2018-09-27 DIAGNOSIS — I82402 Acute embolism and thrombosis of unspecified deep veins of left lower extremity: Secondary | ICD-10-CM

## 2018-09-27 DIAGNOSIS — Z7901 Long term (current) use of anticoagulants: Secondary | ICD-10-CM

## 2018-09-27 MED ORDER — ONDANSETRON 4 MG PO TBDP: 4.0000 mg | ORAL_TABLET | Freq: Three times a day (TID) | ORAL | 0 refills | Status: DC | PRN

## 2018-09-27 MED ORDER — ENOXAPARIN SODIUM 60 MG/0.6ML ~~LOC~~ SOLN: 55.0000 mg | Freq: Two times a day (BID) | SUBCUTANEOUS | 1 refills | Status: DC

## 2018-09-27 NOTE — Progress Notes (Signed)
Subjective:  Patient ID: Sabrina Woodard, female    DOB: 01/03/1976  Age: 43 y.o. MRN: [id number]  CC: The primary encounter diagnosis was Anticoagulation monitoring, INR range 2-3. A diagnosis of Leg DVT (deep venous thromboembolism), acute, left (HCC) was also pertinent to this visit.  HPI Sabrina Woodard presents for follow up on recent diagnosis of LLE DVT  In the distal left femoral vein ,  the left popliteal vein, and the left peroneal vein. The DVT was  diagnosed on Jan 7th. The DVT occurred in the setting of a several day lapse in coumadin .  She reports that she Had missed 2 days of coumadin due to a viral illness  INR was subtherapeutic at 1.58 . However she had been having intermittent pain in the left calf sine Dec 20th and tried to make an appointment but was told there were no appointments until the first week of January by a female staff member..  She was prescribed Lovenox 55 mg bid for 7 days and warfarin was resumed at her previous dose of 6 mg daily  .  Her calf pain has improved . She denies shortness of breath and pleurisy    Outpatient Medications Prior to Visit  Medication Sig Dispense Refill  . enoxaparin (LOVENOX) 100 MG/ML injection Inject 0.55 mLs (55 mg total) into the skin every 12 (twelve) hours for 7 days. 7.7 mL 0  . warfarin (COUMADIN) 1 MG tablet Take 1 tablet (1 mg total) by mouth daily at 6 PM. Take with 5 mg daily 90 tablet 0  . warfarin (COUMADIN) 5 MG tablet TAKE 1 TABLET BY MOUTH EVERY DAY AT 6 PM 90 tablet 1   No facility-administered medications prior to visit.     Review of Systems;  Patient denies headache, fevers, malaise, unintentional weight loss, skin rash, eye pain, sinus congestion and sinus pain, sore throat, dysphagia,  hemoptysis , cough, dyspnea, wheezing, chest pain, palpitations, orthopnea, edema, abdominal pain, nausea, melena, diarrhea, constipation, flank pain, dysuria, hematuria, urinary  Frequency, nocturia, numbness, tingling,  seizures,  Focal weakness, Loss of consciousness,  Tremor, insomnia, depression, anxiety, and suicidal ideation.      Objective:  BP 116/84 (BP Location: Left Arm, Patient Position: Sitting, Cuff Size: Normal)   Pulse (!) 55   Temp 98 F (36.7 C) (Oral)   Resp 14   Ht 4\' 11"  (1.499 m)   Wt 116 lb (52.6 kg)   LMP 09/09/2018 (Exact Date)   SpO2 97%   BMI 23.43 kg/m   BP Readings from Last 3 Encounters:  09/27/18 116/84  09/24/18 123/85  07/26/18 102/70    Wt Readings from Last 3 Encounters:  09/27/18 116 lb (52.6 kg)  09/24/18 120 lb (54.4 kg)  07/26/18 120 lb (54.4 kg)    General appearance: alert, cooperative and appears stated age Ears: normal TM's and external ear canals both ears Throat: lips, mucosa, and tongue normal; teeth and gums normal Neck: no adenopathy, no carotid bruit, supple, symmetrical, trachea midline and thyroid not enlarged, symmetric, no tenderness/mass/nodules Back: symmetric, no curvature. ROM normal. No CVA tenderness. Lungs: clear to auscultation bilaterally Heart: regular rate and rhythm, S1, S2 normal, no murmur, click, rub or gallop Abdomen: soft, non-tender; bowel sounds normal; no masses,  no organomegaly Pulses: 2+ and symmetric Skin: Skin color, texture, turgor normal. No rashes or lesions Lymph nodes: Cervical, supraclavicular, and axillary nodes normal.  No results found for: HGBA1C  Lab Results  Component Value Date  CREATININE 0.96 07/26/2018   CREATININE 1.17 (H) 05/21/2017   CREATININE 0.84 05/19/2017    Lab Results  Component Value Date   WBC 4.3 07/26/2018   HGB 15.8 (H) 07/26/2018   HCT 46.5 (H) 07/26/2018   PLT 161.0 07/26/2018   GLUCOSE 89 07/26/2018   CHOL 203 (H) 07/26/2018   TRIG 105.0 07/26/2018   HDL 57.60 07/26/2018   LDLCALC 124 (H) 07/26/2018   ALT 12 07/26/2018   AST 19 07/26/2018   NA 138 07/26/2018   K 4.1 07/26/2018   CL 104 07/26/2018   CREATININE 0.96 07/26/2018   BUN 13 07/26/2018   CO2 28  07/26/2018   TSH 1.07 07/26/2018   INR 1.58 09/24/2018    [place] Venous Img Lower Unilateral Left  Result Date: 09/24/2018 CLINICAL DATA:  History of DVT and protein C deficiency, now with left lower extremity pain. Evaluate for acute or chronic DVT. EXAM: LEFT LOWER EXTREMITY VENOUS DOPPLER ULTRASOUND TECHNIQUE: Gray-scale sonography with graded compression, as well as color Doppler and duplex ultrasound were performed to evaluate the lower extremity deep venous systems from the level of the common femoral vein and including the common femoral, femoral, profunda femoral, popliteal and calf veins including the posterior tibial, peroneal and gastrocnemius veins when visible. The superficial great saphenous vein was also interrogated. Spectral Doppler was utilized to evaluate flow at rest and with distal augmentation maneuvers in the common femoral, femoral and popliteal veins. COMPARISON:  Left lower extremity Doppler ultrasound - 05/08/2011 FINDINGS: Contralateral Common Femoral Vein: Respiratory phasicity is normal and symmetric with the symptomatic side. No evidence of thrombus. Normal compressibility. Common Femoral Vein: No evidence of acute or chronic thrombus. Normal compressibility, respiratory phasicity and response to augmentation. Saphenofemoral Junction: No evidence of acute or chronic thrombus. Normal compressibility and flow on color Doppler imaging. Profunda Femoral Vein: No evidence of acute or chronic thrombus. Normal compressibility and flow on color Doppler imaging. Femoral Vein: There is mixed echogenic occlusive DVT within one of the paired distal left femoral veins (images 28 through 32). The adjacent paired femoral vein appears widely patent. Popliteal Vein: There is mixed echogenic near occlusive DVT involving the left popliteal vein (images 36 through 43). Calf Veins: There is mixed echogenic occlusive DVT involving one of the paired left peroneal veins (image 50). The posterior tibial  veins appear patent where imaged. Superficial Great Saphenous Vein: No evidence of thrombus. Normal compressibility. Venous Reflux:  None. Other Findings:  None. IMPRESSION: Examination is positive for age-indeterminate occlusive DVT involving the distal aspect of one of the paired left femoral veins, the left popliteal vein as well as one of the paired left peroneal veins. DVT with seen at these locations on remote venous Doppler ultrasound performed 04/2011, though given lack of more recent interval comparison examinations, an acute on chronic process is not excluded. Clinical correlation is advised. Electronically Signed   By: Simonne ComeJohn  Watts M.D.   On: 09/24/2018 14:42    Assessment & Plan:   Problem List Items Addressed This Visit    Leg DVT (deep venous thromboembolism), acute, left (HCC)    Recurrent,  In the setting of Protein C deficiency and subtherapeutic INR during an acute illness due to missed doses. .  continue lovenox until INR is therapeutic INR ot be done on Monday. No exercise for one week .  No compression knee highs for one week.  Action plan for next lapse in warfarin written out with advice to use lovenox if 2 days  of warfarin are missed .  None of the DOAC's are covered without PA by her insurance,  She prefers to continue warfarin.  Home MD INR program recommended.       Relevant Medications   enoxaparin (LOVENOX) 60 MG/0.6ML injection    Other Visit Diagnoses    Anticoagulation monitoring, INR range 2-3    -  Primary   Relevant Orders   Protime-INR    A total of 25 minutes of face to face time was spent with patient more than half of which was spent in counselling about the above mentioned conditions  and coordination of care   I am having Sabrina Woodard start on enoxaparin. I am also having her maintain her warfarin, warfarin, enoxaparin, and ondansetron.  Meds ordered this encounter  Medications  . ondansetron (ZOFRAN ODT) 4 MG disintegrating tablet    Sig: Take 1  tablet (4 mg total) by mouth every 8 (eight) hours as needed for nausea or vomiting.    Dispense:  20 tablet    Refill:  0  . enoxaparin (LOVENOX) 60 MG/0.6ML injection    Sig: Inject 0.55 mLs (55 mg total) into the skin every 12 (twelve) hours.    Dispense:  10 Syringe    Refill:  1    There are no discontinued medications.  Follow-up: No follow-ups on file.   Sherlene Shams, MD

## 2018-09-27 NOTE — Patient Instructions (Addendum)
If you miss one dose of coumadin,  Take 1.5 x the usual dose the following day  If you miss two doses of coumadin,  Take 2 x the usual dose.     If you miss 3 doses,  You should start the lovenox FIRST  In the morning ,  And restart the warfarin that evening  to avoid increasiing your risk of warfarin skin necrosis .

## 2018-09-29 DIAGNOSIS — I82402 Acute embolism and thrombosis of unspecified deep veins of left lower extremity: Secondary | ICD-10-CM | POA: Insufficient documentation

## 2018-09-29 NOTE — Assessment & Plan Note (Signed)
Recurrent,  In the setting of Protein C deficiency and subtherapeutic INR during an acute illness due to missed doses. .  continue lovenox until INR is therapeutic INR ot be done on Monday. No exercise for one week .  No compression knee highs for one week.  Action plan for next lapse in warfarin written out with advice to use lovenox if 2 days of warfarin are missed .  None of the DOAC's are covered without PA by her insurance,  She prefers to continue warfarin.  Home MD INR program recommended.

## 2018-09-30 ENCOUNTER — Other Ambulatory Visit: Payer: Self-pay | Admitting: Internal Medicine

## 2018-09-30 ENCOUNTER — Other Ambulatory Visit (INDEPENDENT_AMBULATORY_CARE_PROVIDER_SITE_OTHER): Payer: 59

## 2018-09-30 DIAGNOSIS — Z5181 Encounter for therapeutic drug level monitoring: Secondary | ICD-10-CM

## 2018-09-30 DIAGNOSIS — Z7901 Long term (current) use of anticoagulants: Principal | ICD-10-CM

## 2018-09-30 LAB — PROTIME-INR
INR: 2.6 ratio — ABNORMAL HIGH (ref 0.8–1.0)
Prothrombin Time: 29.6 s — ABNORMAL HIGH (ref 9.6–13.1)

## 2018-10-01 ENCOUNTER — Other Ambulatory Visit: Payer: Self-pay | Admitting: Internal Medicine

## 2018-10-03 IMAGING — DX DG ABDOMEN 1V
1 series · 1 of 1 positions shown · non-contrast
Comparison: None.

CLINICAL DATA: Right-sided pain. Patient had lithotripsy on [REDACTED]
on the right. Patient had a ureteral stent removed today.

EXAM:
ABDOMEN - 1 VIEW

[abdomen kub]
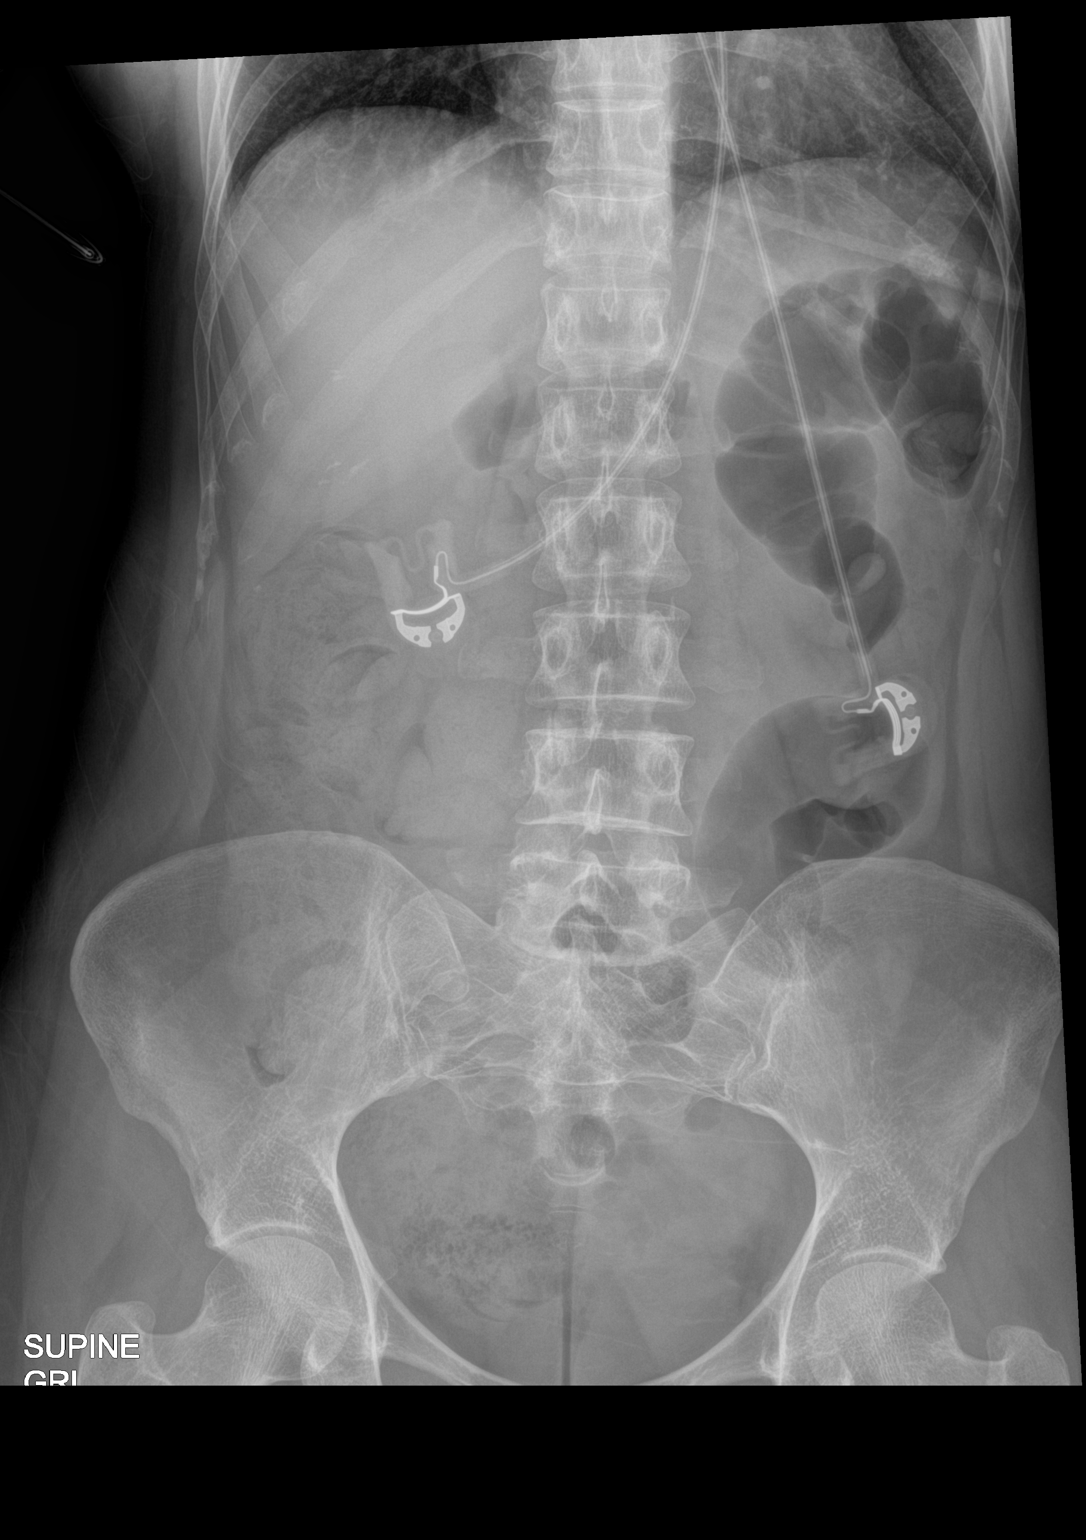

[1 of 1 positions shown; findings below may reference images not displayed]

FINDINGS: Increased amount of fecal retention is seen within the colon from
cecum through distal transverse colon as well as in the right
hemipelvis within what is likely to be sigmoid. This obscures both
renal shadows and expected location of the bladder on the right. No
apparent genitourinary calculi. No free air. No acute osseous
abnormality.
IMPRESSION: Increased colonic stool burden. No radiopaque nor suspicious
calcifications.

## 2018-10-11 ENCOUNTER — Telehealth: Payer: Self-pay | Admitting: *Deleted

## 2018-10-11 NOTE — Telephone Encounter (Signed)
Form was just faxed back.

## 2018-10-11 NOTE — Telephone Encounter (Signed)
Copied from CRM [id number]. Topic: Quick Communication - Home Health Verbal Orders >> Oct 11, 2018  3:36 PM Gaynelle Adu wrote: Morrie Sheldon with [employer] is calling to speak with Dr.Tullo nurse in regards to a order for the patient In Home INR. The best contact number is [phone]. Morrie Sheldon would like to have this completed as soon as possible. With the patient have aenta they are needing to add more information to the order in place. Please advise

## 2018-10-24 ENCOUNTER — Other Ambulatory Visit: Payer: Self-pay | Admitting: Internal Medicine

## 2018-11-01 LAB — POCT INR: INR: 1.9 — AB (ref <=1.1)

## 2018-11-04 ENCOUNTER — Telehealth: Payer: Self-pay

## 2018-11-04 MED ORDER — ENOXAPARIN SODIUM 60 MG/0.6ML ~~LOC~~ SOLN: 55.0000 mg | Freq: Two times a day (BID) | SUBCUTANEOUS | 1 refills | Status: DC

## 2018-11-04 NOTE — Telephone Encounter (Addendum)
Noted. This is below goal. I will send in lovenox for her to take given her recent DVT. She needs to increase her coumadin to 7 mg on Monday, Wednesday, Friday, and Saturday. She will continue 6 mg on Tuesday, Thursday, and Sunday. She will need to recheck her INR in 3 days. If she has chest pain or shortness of breath she needs to go to the ED for evaluation.  Thanks.

## 2018-11-04 NOTE — Telephone Encounter (Signed)
Please contact the patient and see what her INR is today given that she is going to check today. Is still out of range we can make adjustments at that time. Thanks.

## 2018-11-04 NOTE — Addendum Note (Signed)
Addended by: Glori Luis on: 11/04/2018 05:20 PM   Modules accepted: Orders

## 2018-11-04 NOTE — Telephone Encounter (Signed)
Pt is calling back with INR reading. The pt reading at 3:30pm was 1.8

## 2018-11-04 NOTE — Telephone Encounter (Signed)
Spoke with pt and she stated that she has not checked her INR yet but that she will check it around 3:30pm and give [place] a call back with the reading.

## 2018-11-04 NOTE — Addendum Note (Signed)
Addended by: Glori Luis on: 11/04/2018 07:07 PM   Modules accepted: Orders

## 2018-11-04 NOTE — Telephone Encounter (Signed)
Spoke with pt and informed her of her INR results and medication changes. Pt was advised that she is to start the lovenox tonight and to continue the coumadin with the new regimen. Pt is also aware that she will need to recheck her INR in 3 days. Pt aware that if she develops chest pain or SOBr then she should go to the ED or call 911. Pt gave a verbal understanding.

## 2018-11-04 NOTE — Telephone Encounter (Signed)
Received pt's INR results from 11/01/2018 and her INR is 1.9. Pt's range is 2.5 - 3.5. Spoke with pt to verify coumadin dose and she stated that she is taking 6mg  daily. She also stated that she will be checking her INR again today to get on her every Monday routine.

## 2018-11-05 NOTE — Telephone Encounter (Signed)
Spoke with pt and she stated that she will check her INR first thing Thursday morning and call us with the reading. The pt mentioned while on the phone that she is currently taking a zpak for a root canal that she is having done soon.

## 2018-11-05 NOTE — Telephone Encounter (Signed)
I talked to Shanda Bumps about this and she was going to talk with the pt and get her to check INR early in the day so results would be available.  Thanks

## 2018-11-05 NOTE — Telephone Encounter (Signed)
Noted.  Do you need me to do something more with this?  Just let me know.  Also,  I will be leaving early on Thursday and will not be here Friday.  You may also need to let the provider who is covering these days be aware that pt/inr results are expected.

## 2018-11-05 NOTE — Telephone Encounter (Signed)
Please hold for INR on Thursday - to get results.

## 2018-11-05 NOTE — Telephone Encounter (Signed)
This was just so you would be aware on Thursday in case I did not see the result promptly. I will forward to Shanda Bumps to make sure to follow-up with the patient on Thursday and contact me with the INR if it comes back after you are out of the office.

## 2018-11-07 ENCOUNTER — Telehealth: Payer: Self-pay | Admitting: Internal Medicine

## 2018-11-07 LAB — POCT INR: INR: 2.1 — AB (ref <=1.1)

## 2018-11-07 NOTE — Telephone Encounter (Signed)
This is the patient that Dr. Birdie Sons wanted you to advise on. I apologize I was unaware if this before I sent to Dr. French Ana. Please advise?

## 2018-11-07 NOTE — Telephone Encounter (Signed)
Patient is aware of results and will stop lovenox, continue current dose of coumadin and recheck INR Monday morning.

## 2018-11-07 NOTE — Telephone Encounter (Signed)
See attached regarding lovenox.  Since INR 2.1 - hold lovenox and continue current dose of coumadin (per Dr Purvis Sheffield note).  Recheck pt/inr in the next few days to confirm INR ok off lovenox.

## 2018-11-07 NOTE — Telephone Encounter (Signed)
INR at goal 2-3 continue same dose  Let PCP know again next week INR   TMS

## 2018-11-07 NOTE — Telephone Encounter (Signed)
Pt advised to stop lovenox, continue current coumadin dose and recheck INR on Monday morning. Sending this to Shanda Bumps to hold message since Dr. Lorin Picket and I will both be out of office on Monday.

## 2018-11-07 NOTE — Telephone Encounter (Signed)
Copied from CRM [id number]. Topic: Quick Communication - See Telephone Encounter >> Nov 07, 2018  7:45 AM Fanny Bien wrote: CRM for notification. See Telephone encounter for: 11/07/18. Pt called to report INR results INR=2.1. please advise

## 2018-11-07 NOTE — Telephone Encounter (Signed)
Given that INR is 2.1, she can stop lovenox after today.  Continue on coumadin (per Dr Purvis Sheffield note).  Will need to recheck ptinr in the next few days to confirm remaining in a good range off lovenox.  Any problems, let us know.

## 2018-11-07 NOTE — Telephone Encounter (Signed)
Patients INR is 2.1 today. Dose is 7 mg on Mon/Wed/Fri/Sat and 6 mg on Tues/Thurs/Sun. Lovenox injections q 12 hrs.

## 2018-11-11 LAB — POCT INR: INR: 2.3 — AB (ref 0.9–1.1)

## 2018-11-11 NOTE — Telephone Encounter (Signed)
INR result from 11/11/2018 is 2.3. Pt is currently taking 7mg  Monday, Wednesday, Friday and Saturday and 6mg  on Tuesday, Thursday and Sunday. INR has been abstracted.

## 2018-11-11 NOTE — Telephone Encounter (Signed)
My Chart message sent

## 2018-11-14 ENCOUNTER — Telehealth: Payer: Self-pay | Admitting: Internal Medicine

## 2018-11-18 ENCOUNTER — Encounter: Payer: Self-pay | Admitting: Internal Medicine

## 2018-11-18 ENCOUNTER — Telehealth: Payer: Self-pay

## 2018-11-18 LAB — PROTIME-INR

## 2018-11-18 NOTE — Telephone Encounter (Signed)
Pts INR result from 11/18/2018 is 2.6. Pt is currently taking 7mg  Monday, Wednesday and Friday and 6mg  all other days.

## 2018-11-25 LAB — POCT INR: INR: 2.4 — AB (ref 0.9–1.1)

## 2018-11-26 ENCOUNTER — Telehealth: Payer: Self-pay

## 2018-11-26 NOTE — Telephone Encounter (Signed)
Pt's INR from 11/25/2018 is 2.4. Pt is currently taking 7mg  Monday, Wednesday, and Friday and taking 6mg  all other days.

## 2018-11-26 NOTE — Telephone Encounter (Signed)
Coumadin level is therapeutic,  Continue current regimen and repeat PT/INR in one week 

## 2018-11-27 NOTE — Telephone Encounter (Signed)
Patient is aware of results and will recheck PT/INR in one week.

## 2018-12-02 ENCOUNTER — Encounter: Payer: Self-pay | Admitting: Internal Medicine

## 2018-12-02 LAB — PROTIME-INR

## 2018-12-03 ENCOUNTER — Telehealth: Payer: Self-pay

## 2018-12-03 NOTE — Telephone Encounter (Signed)
Spoke with pt and informed her of her INR result and that she needs to continue taking her current regimen. Pt gave a verbal understanding.

## 2018-12-03 NOTE — Telephone Encounter (Signed)
INR result from 12/02/2018 is 2.4. Pt is currently taking 7mg  on Monday, Wednesday and Friday and taking 6mg  all other days.

## 2018-12-09 LAB — POCT INR: INR: 2.6 — AB (ref 0.9–1.1)

## 2018-12-10 ENCOUNTER — Other Ambulatory Visit: Payer: Self-pay | Admitting: Internal Medicine

## 2018-12-10 ENCOUNTER — Telehealth: Payer: Self-pay

## 2018-12-10 NOTE — Telephone Encounter (Signed)
INR result from 12/09/2018 is 2.6. Pt is currently taking 7mg  on Monday, Wednesday and Friday and 6mg  all other days.

## 2018-12-10 NOTE — Telephone Encounter (Signed)
My Chart message sent

## 2018-12-10 NOTE — Telephone Encounter (Signed)
Pt was notified of her mychart message. Pt gave a verbal understanding.

## 2018-12-17 ENCOUNTER — Telehealth: Payer: Self-pay | Admitting: Internal Medicine

## 2018-12-17 DIAGNOSIS — Z7901 Long term (current) use of anticoagulants: Secondary | ICD-10-CM | POA: Insufficient documentation

## 2018-12-17 LAB — PROTIME-INR: INR: 1.8 — AB (ref 0.9–1.1)

## 2018-12-17 NOTE — Telephone Encounter (Signed)
INR  1.8 on March 31  regiemn is  7 mg alt with 6 ,  Advised to increase dose to 7 mg daily and repeat in 1 week  MyChart message sent

## 2018-12-17 NOTE — Telephone Encounter (Signed)
Sabrina Woodard, CSR from [facility] called to notify physician that pt's INR was 1.8 obtained; this was from 12/17/2018 "around 1100" (Sabrina Woodard states that she is not sure of the time; value repeated for accuracy,and she confirms; pt normally sees Dr Darrick Huntsman, [facility]; unable to contact [facility] by phone; notified Mal Amabile via [place];  will route to office for notification.

## 2018-12-17 NOTE — Telephone Encounter (Signed)
Spoke with pt and informed her of her INR result and coumadin dose change. Pt gave a verbal understanding. 

## 2018-12-17 NOTE — Assessment & Plan Note (Signed)
INR  1.8 on March 31  regiemn is  7 mg alt with 6 ,  Advised to increase dose to 7 mg daily and repeat in 1 week

## 2018-12-17 NOTE — Telephone Encounter (Signed)
See previous message

## 2018-12-23 ENCOUNTER — Ambulatory Visit: Payer: Self-pay | Admitting: *Deleted

## 2018-12-23 NOTE — Telephone Encounter (Signed)
Letter drafted explaining current policy

## 2018-12-23 NOTE — Telephone Encounter (Signed)
Spoke with pt to let her know that Dr. Darrick Huntsman has drafted a letter and that she can go on her mychart to print it off and give to her work. Pt gave a verbal understanding.

## 2018-12-23 NOTE — Telephone Encounter (Signed)
LMTCB, Please transfer pt to our office.

## 2018-12-23 NOTE — Telephone Encounter (Signed)
Spoke with pt to let her know that we do not do testing here and that im not sure where she can go to get tested because she is not having symptoms herself. However her job will not let her come back until she has a negative Covid-19 test. They want her tested because her daughter was in contact with someone that tested positive the oldest daughter did not have symptoms but the youngest daughter has had symptoms of fever, headache, lethargic, and sore throat that started last Thrusday.

## 2018-12-23 NOTE — Telephone Encounter (Signed)
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 Pt states that her oldest daughter was exposed to someone on 12/10/18 who later tested positive for COVID on last Tuesday. Pt states that neither her or or oldest daughter developed any symptoms. Pt states that her 44 year old daughter began to have a fever of 100.3, headache, lethargy, and sore throat on last Thursday. Pt reports that her younger daughter did not have SOB and symptoms resolved on Saturday.  Pt states that she has been self isolating at home even before her daughter began to develop symptoms. Pt states that her job  is not letting her come back until tested for COVID. Pt can be contacted at [phone].  Reason for Disposition . COVID-19 Testing, questions about . [1] No COVID-19 EXPOSURE BUT [2] living with someone who was exposed and who has no fever or cough  Answer Assessment - Initial Assessment Questions 1. CLOSE CONTACT: "Who is the person with the confirmed or suspected COVID-19 infection that you were exposed to?"     Pt's oldest daughter was around some one on 12/10/18 who was confirmed to be positive for COVID on last Tuesday 12/17/18 2. PLACE of CONTACT: "Where were you when you were exposed to COVID-19?" (e.g., home, school, medical waiting room; which city?)     home 3. TYPE of CONTACT: "How much contact was there?" (e.g., sitting next to, live in same house, work in same office, same building)     Lives in house with daughter 4. DURATION of CONTACT: "How long were you in contact with the COVID-19 patient?" (e.g., a few seconds, passed by person, a few minutes, live with the patient)     Lives with the daughter 5. DATE of CONTACT: "When did you have contact with a COVID-19 patient?" (e.g., how many days ago)     12/10/18 6. COMMUNITY SPREAD: "Are there lots of cases or COVID-19 (community spread) where you  live?" (See public health department website, if unsure)   * MAJOR community spread: high number of cases; numbers of cases are increasing; many people hospitalized.   * MINOR community spread: low number of cases; not increasing; few or no people hospitalized   Not assessed   7. SYMPTOMS: "Do you have any symptoms?" (e.g., fever, cough, breathing difficulty)     No 8. PREGNANCY OR POSTPARTUM: "Is there any chance you are pregnant?" "When was your last menstrual period?" "Did you deliver in the last 2 weeks?"     N0 10. HIGH RISK: "Do you have any heart or lung problems? Do you have a weak immune system?" (e.g., CHF, COPD, asthma, HIV positive, chemotherapy, renal failure, diabetes mellitus, sickle cell anemia)       Susceptible to PNA, and PEs due to blood clotting disorders  Protocols used: CORONAVIRUS (COVID-19) EXPOSURE-A-AH

## 2018-12-25 ENCOUNTER — Telehealth: Payer: Self-pay | Admitting: Internal Medicine

## 2018-12-25 ENCOUNTER — Telehealth: Payer: Self-pay

## 2018-12-25 MED ORDER — WARFARIN SODIUM 7.5 MG PO TABS: 7.5000 mg | ORAL_TABLET | Freq: Every day | ORAL | 1 refills | Status: DC

## 2018-12-25 NOTE — Telephone Encounter (Signed)
Dise was increased to 7 mg daily after last wee's INR of 1.8 (see telephone note dated 3/31;  If she has been taking daily she needs to increase dose to 7.5 mg daily   New rx for 7.5 mg tablets [facility] [place]

## 2018-12-25 NOTE — Telephone Encounter (Signed)
error 

## 2018-12-25 NOTE — Telephone Encounter (Signed)
LMTCB. Need to find out what pt's current coumadin dose is. PEC may speak with pt.

## 2018-12-25 NOTE — Telephone Encounter (Signed)
Patient was taking 7 mg daily. She is aware to increase to 7.5 mg q day and that rx has been sent in.

## 2018-12-25 NOTE — Telephone Encounter (Signed)
Copied from CRM [id number]. Topic: Quick Communication - See Telephone Encounter >> Dec 25, 2018  9:55 AM Fanny Bien wrote: CRM for notification. See Telephone encounter for: 12/25/18. [name] calling with [facility] called and stated that patient reported INR of 1.8. please advise

## 2019-01-01 LAB — POCT INR: INR: 2.9 — AB (ref 0.9–1.1)

## 2019-01-02 ENCOUNTER — Telehealth: Payer: Self-pay

## 2019-01-02 NOTE — Telephone Encounter (Signed)
MyChart message sent   Dose reduced to 7 mg daily

## 2019-01-02 NOTE — Telephone Encounter (Signed)
INR result from 01/01/2019 is 2.9. Pt is currently taking 7.5mg  daily.

## 2019-01-03 NOTE — Telephone Encounter (Signed)
Spoke with pt and she stated that she did see the mychart message and is aware of the dose change.

## 2019-01-09 ENCOUNTER — Telehealth: Payer: Self-pay

## 2019-01-09 LAB — PROTIME-INR: INR: 3 — AB (ref 0.9–1.1)

## 2019-01-09 NOTE — Telephone Encounter (Signed)
Pt's INR from 01/09/2019 is 3.0. Pt is currently taking 7mg  daily and has been on the dose for a week. Spoke with Dr. Lorin Picket verbally and she had the pt change the dose to 6mg  on Monday and Thursday and 7mg  all other days. Pt is aware of the dose change.

## 2019-01-10 ENCOUNTER — Other Ambulatory Visit: Payer: Self-pay | Admitting: Internal Medicine

## 2019-01-10 NOTE — Telephone Encounter (Signed)
Thank you Sabrina Woodard.    I agree with your change in warfarin regimen  and patient has [facility] so she is monitoring weekly .

## 2019-01-10 NOTE — Telephone Encounter (Signed)
FYI.  She had 5mg  tablets and 2mg  tablets.  I reviewed your last recommendation when her INR was 2.9 and you adjusted the dose down slightly.  I had her decrease her dose to 6mg  on Monday and Thursday and 7mg  all other days (given the tablet doses she had at home).  She is going to recheck her pt/inr next week.  If you would like for me to have her do anything different, let me know.  We can contact her if needed.

## 2019-01-17 LAB — POCT INR: INR: 3.1 — AB (ref 0.9–1.1)

## 2019-01-20 ENCOUNTER — Telehealth: Payer: Self-pay

## 2019-01-20 NOTE — Telephone Encounter (Signed)
Spoke with pt and informer her that she needs to continue with her current regimen and repeat INR on Monday. Pt gave a verbal understanding.

## 2019-01-20 NOTE — Telephone Encounter (Signed)
Continue current regimen and repeat on Monday,  DR SCOTT will need to respond on Monday bc I will be unavailable next Monday to respond to any results or calls bc I will be driving back from [facility]

## 2019-01-20 NOTE — Telephone Encounter (Signed)
INR from 01/17/2019 is 3.1. Pt is currently taking 6mg  Monday and Thursday and 7mg  all other days.

## 2019-01-24 LAB — POCT INR: INR: 3.4 — AB (ref <=1.1)

## 2019-01-25 ENCOUNTER — Other Ambulatory Visit: Payer: Self-pay | Admitting: Internal Medicine

## 2019-01-27 ENCOUNTER — Telehealth: Payer: Self-pay

## 2019-01-27 NOTE — Telephone Encounter (Signed)
Per Dr. Lorin Picket, changed dose to 5 mg on Monday/Thursday, 7 mg all other days. Recheck on Friday morning. Spoke with husband since I was unable to reach pt. Husband wrote down instructions for pt and was also advised to have her call if she had any questions.

## 2019-01-27 NOTE — Telephone Encounter (Signed)
Noted.  Please confirm with pt received directions.  Make note to get pt/inr end of week.

## 2019-01-27 NOTE — Telephone Encounter (Signed)
Received pt's PT/INR. INR is 3.4. Last INR was 3.1 on 01/17/19. Current dose is 6 mg on Monday and Thursday and then 7 mg all other days.

## 2019-01-28 NOTE — Telephone Encounter (Signed)
Called pt to verify that she understood new directions. She took 5 mg last night. Will take 5 again on Thursday. 7mg  all other days. She is going to recheck on Friday morning.

## 2019-01-31 LAB — POCT INR: INR: 2.9 — AB (ref 0.9–1.1)

## 2019-02-03 ENCOUNTER — Telehealth: Payer: Self-pay

## 2019-02-03 NOTE — Telephone Encounter (Signed)
INR result from 01/31/2019 was 2.9. Pt is currently taking 5mg  Monday and Thursday and 7mg  all other days.   Abstracted.

## 2019-02-03 NOTE — Telephone Encounter (Signed)
Coumadin level is therapeutic,  Continue current regimen and repeat PT/INR in one week 

## 2019-02-03 NOTE — Telephone Encounter (Signed)
Spoke with pt and informed her of her INR result and that she should continue her current regimen.

## 2019-02-10 LAB — POCT INR: INR: 2.7 — AB (ref <=1.1)

## 2019-02-11 ENCOUNTER — Telehealth: Payer: Self-pay

## 2019-02-11 DIAGNOSIS — Z7901 Long term (current) use of anticoagulants: Secondary | ICD-10-CM

## 2019-02-11 NOTE — Assessment & Plan Note (Signed)
Coumadin level is therapeutic on current regimen of 5 mg x 2 and 7 mg all other days   repeat PT/INR in one week

## 2019-02-11 NOTE — Telephone Encounter (Signed)
INR result from 02/10/2019 is 2.7. Pt is currently taking 5mg  Monday and Thursday and 7mg  all other days.   Abstracted

## 2019-02-11 NOTE — Telephone Encounter (Signed)
Coumadin level is therapeutic,  Continue current regimen of 5 mg x 2 days and 7 mg all other days .   repeat PT/INR in one week

## 2019-02-11 NOTE — Telephone Encounter (Signed)
Spoke with pt and informed her of her INR result. Pt is aware of appt date and time.

## 2019-02-21 LAB — POCT INR: INR: 3 — AB (ref <=1.1)

## 2019-02-24 ENCOUNTER — Telehealth: Payer: Self-pay

## 2019-02-24 NOTE — Telephone Encounter (Signed)
MyChart message sent .  contineu current dose

## 2019-02-24 NOTE — Telephone Encounter (Signed)
INR result from 02/21/2019 is 3.0. Pt is currently taking 5mg  on Monday and Thursday and 7mg  all other days.    Abstracted.

## 2019-03-03 ENCOUNTER — Telehealth: Payer: Self-pay | Admitting: Internal Medicine

## 2019-03-03 DIAGNOSIS — Z7901 Long term (current) use of anticoagulants: Secondary | ICD-10-CM

## 2019-03-03 LAB — POCT INR: INR: 3.7 — AB (ref 0.9–1.1)

## 2019-03-03 NOTE — Telephone Encounter (Signed)
Zina, from [facility] calling to report an INR -3.7 that was called in by the patient this evening. INR readback and verified.

## 2019-03-04 ENCOUNTER — Telehealth: Payer: Self-pay

## 2019-03-04 DIAGNOSIS — Z7901 Long term (current) use of anticoagulants: Secondary | ICD-10-CM

## 2019-03-04 NOTE — Telephone Encounter (Signed)
See other message for correction.

## 2019-03-04 NOTE — Assessment & Plan Note (Signed)
Correction   reduce weekly dose from 40 mg to 35 mg  (5 mg daily )

## 2019-03-04 NOTE — Telephone Encounter (Signed)
Spoke with pt and informed her that she needs to change her regimen to 5mg  daily. Pt gave a verbal understanding.

## 2019-03-04 NOTE — Telephone Encounter (Signed)
INR result from 03/03/2019 is 3.7. Pt is currently taking  Pt stated the other night she could have taken the 7.5mg  plus the 1mg  by accident. But normal regimen is 5mg  on Monday and Thursday and 6mg  all other days.   Abstracted

## 2019-03-04 NOTE — Assessment & Plan Note (Addendum)
INR 3.7 on current regimen on 45 mg per week (5 mg x 2,  7 mg x 5).  Advise patient to reduce dose to 41 mg per week (5 mg x 4,  7 mg x 3 ( repeat INR 2 weeks )

## 2019-03-04 NOTE — Telephone Encounter (Signed)
INR 3.7 on current regimen on 45 mg per week (5 mg x 2,  7 mg x 5).  Advise patient to reduce dose to 41 mg per week (5 mg x 4,  7 mg x 3 ( repeat INR 2 weeks )  

## 2019-03-04 NOTE — Telephone Encounter (Signed)
So ask her to reduce weekly dose from 40 mg to 35 mg  (5 mg daily )

## 2019-03-13 LAB — POCT INR: INR: 1.7 — AB (ref 0.9–1.1)

## 2019-03-14 ENCOUNTER — Telehealth: Payer: Self-pay

## 2019-03-14 NOTE — Telephone Encounter (Signed)
Pt called back returning your call. Pt states you can leave vm with the results and instructions. Pt states she is taking 5mg  a day. Thank you!  Call pt @ [phone].

## 2019-03-14 NOTE — Telephone Encounter (Signed)
Patient's INR is 1.7.  Patient called back and left message that she is taking 5 mg of coumadin.

## 2019-03-14 NOTE — Telephone Encounter (Signed)
INCREASE DOSE TO 6 MG DAILY AND REPEAT INR IN ONE WEEK

## 2019-03-14 NOTE — Telephone Encounter (Signed)
Fax came in from [facility].  Patient's INR results are 1.7.  Called patient to confirm Coumadin regimen.  No answer.  LMTCB.

## 2019-03-14 NOTE — Telephone Encounter (Signed)
Called patient.  No answer.  Left detailed voice message (ok per patient) to increase coumadin dose to 6 mg daily and repeat INR in one week per PCP's notes.

## 2019-03-17 ENCOUNTER — Other Ambulatory Visit: Payer: Self-pay

## 2019-03-20 ENCOUNTER — Encounter: Payer: Self-pay | Admitting: Internal Medicine

## 2019-03-20 LAB — POCT INR

## 2019-03-24 ENCOUNTER — Telehealth: Payer: Self-pay

## 2019-03-24 DIAGNOSIS — Z7901 Long term (current) use of anticoagulants: Secondary | ICD-10-CM

## 2019-03-24 NOTE — Telephone Encounter (Signed)
Spoke with pt and informed her that her INR result is therapeutic and that she needs to continue 6mg  daily.

## 2019-03-24 NOTE — Telephone Encounter (Signed)
Coumadin level is therapeutic,  Continue current regimen and repeat PT/INR in one week 

## 2019-03-24 NOTE — Telephone Encounter (Signed)
INR result from 03/20/2019 is 2.5. Pt is currently taking 6mg  daily.

## 2019-03-24 NOTE — Assessment & Plan Note (Signed)
Coumadin level is therapeutic,  Continue current regimen  Of 6 mg daily and repeat PT/INR in one month

## 2019-03-28 ENCOUNTER — Telehealth: Payer: Self-pay

## 2019-03-28 LAB — POCT INR: INR: 2.7 — AB (ref 0.9–1.1)

## 2019-03-28 NOTE — Telephone Encounter (Signed)
Spoke with pt and informed her of her INR result and that she needs to continue taking the 6mg  daily. Pt gave a verbal understanding.

## 2019-03-28 NOTE — Telephone Encounter (Signed)
INR result from 7/10/220 is 2.7. Pt is currently taking 6mg  daily.   Abstracted.

## 2019-03-28 NOTE — Telephone Encounter (Signed)
Coumadin level is therapeutic,  Continue current regimen and repeat PT/INR in one week 

## 2019-04-11 ENCOUNTER — Telehealth: Payer: Self-pay | Admitting: Internal Medicine

## 2019-04-11 NOTE — Telephone Encounter (Signed)
Left detailed message on cell phone per DPR.  Left message to stay same dose recheck on Tuesday.

## 2019-04-11 NOTE — Telephone Encounter (Signed)
Continue same dose of coumadin and recheck pt/inr on Tuesday 04/15/19

## 2019-04-11 NOTE — Telephone Encounter (Addendum)
Patient INR =  [facility] IN[facility] Patient couimadin Regimen currently 6 mg daily. Tried to reach patient to see if she missed a dose , no answer left voicemail call office back.

## 2019-04-15 ENCOUNTER — Encounter: Payer: Self-pay | Admitting: Internal Medicine

## 2019-04-15 LAB — POCT INR

## 2019-04-15 NOTE — Telephone Encounter (Signed)
[facility] calling to notify Dr. Derrel Nip that patient INR was 1.5 today and this has been faxed to the office.

## 2019-04-16 NOTE — Telephone Encounter (Signed)
Spoke with pt and informed her of her new coumadin regimen. Pt gave a verbal understanding.

## 2019-04-16 NOTE — Telephone Encounter (Signed)
She already has the 1 mg tablets

## 2019-04-16 NOTE — Telephone Encounter (Signed)
Take 12 mg today ONLY.   starting tomorrow take 7 mg daily  And repeat INR in one week.    I will send rx for 1 mg coumadin to pharmacy to tak 6 + 1 daily starting tomorrow

## 2019-04-16 NOTE — Telephone Encounter (Signed)
Pt's INR is 1.5. Pt stated that she is currently taking 6mg  daily.

## 2019-04-28 LAB — POCT INR: INR: 2.4 — AB (ref 0.9–1.1)

## 2019-04-30 ENCOUNTER — Telehealth: Payer: Self-pay

## 2019-04-30 NOTE — Telephone Encounter (Signed)
INR for 04/28/2019 is 2.4. Pt is currently taking 7mg  daily.   Abstracted.

## 2019-04-30 NOTE — Telephone Encounter (Signed)
Coumadin level is therapeutic,  Continue current regimen and repeat PT/INR in one week 

## 2019-04-30 NOTE — Telephone Encounter (Signed)
Spoke with pt and informed her that her INR is therapeutic and that she needs to continue her current coumadin dose. Pt gave a verbal understanding.

## 2019-05-08 ENCOUNTER — Telehealth: Payer: Self-pay

## 2019-05-08 LAB — POCT INR: INR: 1.9 — AB (ref <=1.1)

## 2019-05-08 NOTE — Telephone Encounter (Signed)
Increase dose to 7.5 mg daily.  That dose of coumadin is in her chart ,  So if that's what she is actually taking (not 7 mg) , she should increase daily dose  to 8 mg daily .

## 2019-05-08 NOTE — Telephone Encounter (Signed)
DOSE UNCLEAR FROM YOUR MESSAGE   CHART SAYS HER DOSE IS 6 MG DAILY

## 2019-05-08 NOTE — Telephone Encounter (Signed)
Pt's INR for 05/08/2019 is 1.9. Pt is currently taking 2.4mg .   Abstracted.

## 2019-05-08 NOTE — Telephone Encounter (Signed)
My apologies she is currently taking 7mg  daily.

## 2019-05-08 NOTE — Telephone Encounter (Signed)
LMTCB. PEC may speak with pt.  

## 2019-05-09 NOTE — Telephone Encounter (Signed)
LMTCB. PEC may speak with pt.  

## 2019-05-09 NOTE — Telephone Encounter (Signed)
mychart message has been sent with directions of coumadin dose change due to not being able to reach pt by phone.

## 2019-05-19 ENCOUNTER — Telehealth: Payer: Self-pay | Admitting: Internal Medicine

## 2019-05-19 ENCOUNTER — Telehealth: Payer: Self-pay

## 2019-05-19 LAB — POCT INR: INR: 3.1 — AB (ref 0.9–1.1)

## 2019-05-19 NOTE — Telephone Encounter (Signed)
My Chart message sent

## 2019-05-19 NOTE — Telephone Encounter (Signed)
Coumadin level is therapeutic,  Continue current regimen and repeat PT/INR in one week 

## 2019-05-19 NOTE — Telephone Encounter (Signed)
INR is 3.1 for 05/19/2019. Spoke with pt and she stated that she is currently taking 7mg  daily.   Abstracted.

## 2019-05-20 NOTE — Telephone Encounter (Signed)
Spoke with pt to let her know that she needs to continue her current regimen. Pt gave a verbal understanding.

## 2019-05-20 NOTE — Telephone Encounter (Signed)
Spoke with pt and informed her that her INR is therapeutic and that she needs to continue her current regimen. Pt gave a verbal understanding.

## 2019-05-23 ENCOUNTER — Telehealth: Payer: Self-pay | Admitting: Internal Medicine

## 2019-05-27 ENCOUNTER — Telehealth: Payer: Self-pay

## 2019-05-27 LAB — POCT INR: INR: 3.6 — AB (ref 0.9–1.1)

## 2019-05-27 NOTE — Telephone Encounter (Signed)
SKIP HER DOSE TONIGHT AND RESUME 7 MG DAILY STARTING ON WEDNESDAY

## 2019-05-27 NOTE — Telephone Encounter (Signed)
Pt's INR from 05/27/2019 is 3.6. Pt is currently taking 7mg  daily.   Abstracted.

## 2019-05-28 NOTE — Telephone Encounter (Signed)
LMTCB

## 2019-05-29 NOTE — Telephone Encounter (Signed)
Spoke with pt and advised her to skip tonight's dose since wasn't able to get in touch with her yesterday and then resume 7mg . Pt gave a verbal understanding.

## 2019-06-04 LAB — POCT INR: INR: 2.2 — AB (ref 0.9–1.1)

## 2019-06-05 ENCOUNTER — Telehealth: Payer: Self-pay

## 2019-06-05 NOTE — Telephone Encounter (Signed)
INR from 06/04/2019 is 2.2. Pt is currently taking 7mg  daily.   Abstracted.

## 2019-06-11 LAB — POCT INR: INR: 2 — AB (ref 0.9–1.1)

## 2019-06-12 ENCOUNTER — Telehealth: Payer: Self-pay

## 2019-06-12 NOTE — Telephone Encounter (Signed)
INR for 06/11/2019 is 2.0. Pt is currently taking 7mg  daily.   Abstracted.

## 2019-06-13 ENCOUNTER — Other Ambulatory Visit: Payer: Self-pay | Admitting: Internal Medicine

## 2019-06-24 LAB — POCT INR: INR: 2.3 — AB (ref 0.9–1.1)

## 2019-06-25 ENCOUNTER — Telehealth: Payer: Self-pay

## 2019-06-25 DIAGNOSIS — Z7901 Long term (current) use of anticoagulants: Secondary | ICD-10-CM

## 2019-06-25 NOTE — Telephone Encounter (Signed)
Patient notified of results and plan via [place].

## 2019-06-25 NOTE — Telephone Encounter (Signed)
INR from 06/24/2019 is 2.3. Pt is currently taking 7mg  daily.   Abstracted.

## 2019-06-25 NOTE — Assessment & Plan Note (Signed)
Coumadin level is therapeutic on 7 mg daily ,  Continue current regimen and repeat PT/INR in one week  Lab Results  Component Value Date   INR 2.3 (A) 06/24/2019   INR 2.0 (A) 06/11/2019   INR 2.2 (A) 06/04/2019

## 2019-07-08 LAB — POCT INR: INR: 2.3 — AB (ref 0.9–1.1)

## 2019-07-08 NOTE — Telephone Encounter (Signed)
Error

## 2019-07-09 ENCOUNTER — Other Ambulatory Visit: Payer: Self-pay | Admitting: Internal Medicine

## 2019-07-09 ENCOUNTER — Telehealth: Payer: Self-pay

## 2019-07-09 NOTE — Telephone Encounter (Signed)
INR result from 07/08/2019 is 2.3. Pt is currently taking 7mg  daily.   Abstracted.

## 2019-07-22 ENCOUNTER — Telehealth: Payer: Self-pay

## 2019-07-22 DIAGNOSIS — Z7901 Long term (current) use of anticoagulants: Secondary | ICD-10-CM

## 2019-07-22 LAB — POCT INR: INR: 2.3 — AB (ref 0.9–1.1)

## 2019-07-22 NOTE — Assessment & Plan Note (Signed)
Coumadin level is therapeutic,  Continue current regimen OF 7 MG DAILY and repeat PT/INR in one MONTH

## 2019-07-22 NOTE — Telephone Encounter (Signed)
INR from 07/22/2019 is 2.3. Pt is currently taking 7mg  daily.   Abstracted.

## 2019-07-31 LAB — POCT INR: INR: 2.2 — AB (ref 0.9–1.1)

## 2019-08-01 ENCOUNTER — Telehealth: Payer: Self-pay | Admitting: Internal Medicine

## 2019-08-01 NOTE — Telephone Encounter (Signed)
Patient MD -INR 2.2  7 mg coumadin daily last note said not to repeat for one month.

## 2019-08-01 NOTE — Telephone Encounter (Signed)
My mistake. . She is a weekly,.  No chang to reigmen

## 2019-08-01 NOTE — Telephone Encounter (Signed)
Patient notified. Voiced understanding with repeat of regimen and recheck in one week.

## 2019-08-07 LAB — POCT INR: INR: 2.3 — AB (ref 0.9–1.1)

## 2019-08-12 ENCOUNTER — Telehealth: Payer: Self-pay

## 2019-08-12 NOTE — Telephone Encounter (Signed)
INR result for 08/07/2019 is 2.3. Pt is currently taking 7mg  daily.   Abstracted.

## 2019-08-22 ENCOUNTER — Telehealth: Payer: Self-pay

## 2019-08-22 LAB — POCT INR: INR: 2.9 — AB (ref 0.9–1.1)

## 2019-08-22 NOTE — Telephone Encounter (Signed)
My Chart message sent

## 2019-08-22 NOTE — Telephone Encounter (Signed)
INR: 2.9 Last INR: 2.3 Dose: 7 mg q day.

## 2019-08-29 LAB — POCT INR: INR: 3.1 — AB (ref 0.9–1.1)

## 2019-09-03 ENCOUNTER — Telehealth: Payer: Self-pay

## 2019-09-03 ENCOUNTER — Telehealth: Payer: Self-pay | Admitting: Internal Medicine

## 2019-09-03 NOTE — Telephone Encounter (Signed)
Patient did not read last week's MyChart message where I asked her to recheck more frequently because of the recent jump.  She is now above goal so she needs to reduce dose to 6 mg  For 3 days per week and continue 7 mg the other 4 days  If she needs refills to make  A 6 mg dose let me know .

## 2019-09-03 NOTE — Telephone Encounter (Signed)
LMTCB

## 2019-09-03 NOTE — Telephone Encounter (Signed)
INR from 08/29/2019 is 3.1. Pt is currently taking 7mg  daily.   Abstracted.

## 2019-09-04 ENCOUNTER — Encounter: Payer: Self-pay | Admitting: Internal Medicine

## 2019-09-07 LAB — POCT INR: INR: 2.5 — AB (ref 0.9–1.1)

## 2019-09-08 ENCOUNTER — Telehealth: Payer: Self-pay

## 2019-09-08 NOTE — Telephone Encounter (Signed)
INR for 09/07/2019 is 2.5. Pt is currently taking 7mg  daily.   Abstracted.

## 2019-09-08 NOTE — Telephone Encounter (Signed)
My Chart message sent

## 2019-09-17 LAB — POCT INR: INR: 1.8 — AB (ref 0.9–1.1)

## 2019-09-18 ENCOUNTER — Telehealth: Payer: Self-pay

## 2019-09-18 NOTE — Telephone Encounter (Signed)
  Her INR is low at 1.8.  Please take 9 mg today,  And starting tomorrow, increase your dose to 8 mg alternating with 7 mg Repeat INR one week

## 2019-09-18 NOTE — Telephone Encounter (Signed)
INR result for 09/17/2019 is 1.8. Pt is currently taking 7 mg daily.  Abstracted.

## 2019-09-18 NOTE — Telephone Encounter (Signed)
Left a message on voicemail letting pt to know to check her mychart message in regards to her INR results and dose change. Left message because office is closing at 1pm today.

## 2019-09-25 LAB — POCT INR: INR: 2.6 — AB (ref 0.9–1.1)

## 2019-09-26 ENCOUNTER — Telehealth: Payer: Self-pay

## 2019-09-26 NOTE — Telephone Encounter (Signed)
INR for 09/25/2019 is 2.6. Pt is currently alternating 8 mg with 7 mg.   Abstracted.

## 2019-10-03 ENCOUNTER — Telehealth: Payer: Self-pay

## 2019-10-03 LAB — POCT INR: INR: 2.3 — AB (ref 0.9–1.1)

## 2019-10-03 NOTE — Telephone Encounter (Signed)
INR result for 10/03/2019 is 2.3. Pt is currently alternating 8 mg with 7 mg.   Abstracted.

## 2019-10-12 LAB — POCT INR: INR: 3 — AB (ref 0.9–1.1)

## 2019-10-13 ENCOUNTER — Telehealth: Payer: Self-pay

## 2019-10-13 NOTE — Telephone Encounter (Signed)
INR result from 10/12/2019 is 3.0. Pt is currently alternating 8 mg with 7 mg.   Abstracted.

## 2019-10-14 MED ORDER — WARFARIN SODIUM 2 MG PO TABS: ORAL_TABLET | ORAL | 1 refills | Status: DC

## 2019-10-20 LAB — POCT INR: INR: 1.5 — AB (ref 0.9–1.1)

## 2019-10-21 ENCOUNTER — Telehealth: Payer: Self-pay

## 2019-10-21 ENCOUNTER — Telehealth: Payer: Self-pay | Admitting: Internal Medicine

## 2019-10-21 DIAGNOSIS — Z7901 Long term (current) use of anticoagulants: Secondary | ICD-10-CM

## 2019-10-21 NOTE — Telephone Encounter (Signed)
Ok,  If you want to make an appointment to discuss those symptoms,  I encourage you to do so, and we will run some labs as well. .  I would increase the coumadin dose tonight as previously discussed (14 mg tonight) and continue 7 mg daily until next week's INR

## 2019-10-21 NOTE — Telephone Encounter (Signed)
See previous message already sent to Dr. Darrick Huntsman.

## 2019-10-21 NOTE — Telephone Encounter (Signed)
INR result from 10/20/2019 was 1.5. Pt stated that she is currently taking 7mg  daily.   Abstracted.

## 2019-10-21 NOTE — Telephone Encounter (Signed)
[name] from [facility] wanted Dr. Darrick Huntsman to know patient's INR was 1.5 and it was faxed to the office.

## 2019-10-26 LAB — POCT INR: INR: 1.8 — AB (ref 0.9–1.1)

## 2019-10-27 NOTE — Assessment & Plan Note (Addendum)
INr low again at 1.8  patient advised to take   9 mg tonight,  Followed by 8 mg daily going forward,  Repeat iNR Feb 15

## 2019-10-27 NOTE — Telephone Encounter (Signed)
INR from 10/26/2019 is 1.8. Pt is currently taking 7mg  daily.   Abstracted.

## 2019-10-27 NOTE — Telephone Encounter (Signed)
Sabrina Woodard from [facility] called and said pt's INR is 1.8 report from 10/26/19 and it was faxed.

## 2019-10-27 NOTE — Telephone Encounter (Signed)
[facility] INR is  again at 1.8  patient advised to take   9 mg tonight,  Followed by 8 mg daily going forward,  Repeat iNR Feb 15

## 2019-11-03 LAB — POCT INR: INR: 2.9 — AB (ref 0.9–1.1)

## 2019-11-04 ENCOUNTER — Telehealth: Payer: Self-pay

## 2019-11-04 NOTE — Telephone Encounter (Signed)
INR result for 11/03/2019 is 2.9. Pt is currently taking 7mg  daily.   Abstracted.

## 2019-11-04 NOTE — Telephone Encounter (Signed)
My Chart message sent

## 2019-11-13 LAB — POCT INR: INR: 2.4 — AB (ref 0.9–1.1)

## 2019-11-14 ENCOUNTER — Telehealth: Payer: Self-pay

## 2019-11-14 NOTE — Telephone Encounter (Signed)
INR result for 11/13/2019 is 2.4. Pt is currently taking 7 mg daily.   Abstracted.

## 2019-11-25 LAB — POCT INR: INR: 2.3 — AB (ref 0.9–1.1)

## 2019-11-26 ENCOUNTER — Telehealth: Payer: Self-pay

## 2019-11-26 NOTE — Telephone Encounter (Signed)
INR for 11/25/2019 is 2.3. Pt is currently taking 7 mg daily.   Abstracted.

## 2019-12-12 LAB — POCT INR: INR: 2.9 — AB (ref 0.9–1.1)

## 2019-12-15 ENCOUNTER — Telehealth: Payer: Self-pay | Admitting: Internal Medicine

## 2019-12-15 NOTE — Telephone Encounter (Signed)
INR for 12/15/2019 is 2.9. Pt is currently taking 7 mg daily.

## 2019-12-23 LAB — POCT INR: INR: 2.2 — AB (ref 0.9–1.1)

## 2019-12-26 ENCOUNTER — Telehealth: Payer: Self-pay

## 2019-12-26 NOTE — Telephone Encounter (Signed)
Spoke with pt and informed her of her INR result and that she needs to continue her current regimen. Pt gave a verbal understanding.

## 2019-12-26 NOTE — Telephone Encounter (Signed)
INR from 12/23/2019 was 2.2. Pt is currently taking 7 mg daily.   Abstracted.

## 2019-12-26 NOTE — Telephone Encounter (Signed)
Notify pt to continue same coumadin dose and recheck pt/inr in 1 week.

## 2020-01-06 ENCOUNTER — Telehealth: Payer: Self-pay

## 2020-01-06 LAB — POCT INR: INR: 2.4 — AB (ref 0.9–1.1)

## 2020-01-06 NOTE — Telephone Encounter (Signed)
INR result from 01/06/2020 is 2.4. Pt is currently taking 7 mg daily.   Abstracted.

## 2020-01-09 ENCOUNTER — Telehealth: Payer: Self-pay | Admitting: Internal Medicine

## 2020-01-16 ENCOUNTER — Telehealth: Payer: Self-pay

## 2020-01-16 LAB — POCT INR: INR: 1.9 — AB (ref 0.9–1.1)

## 2020-01-16 NOTE — Telephone Encounter (Signed)
Was unable to reach pt by phone and since it is Friday evening I sent pt and mychart message in regards to the medication changes that are needed.

## 2020-01-16 NOTE — Telephone Encounter (Signed)
INR for 01/16/2020 is 1.9. Pt is currently taking 7 mg daily.   Abstracted.

## 2020-01-16 NOTE — Telephone Encounter (Signed)
Increase dose to 7.5 mg daily if she has those tablets.  If she does not,  Change regimen to 8 mg alternating with 7 mg and rep eat in one week

## 2020-01-25 ENCOUNTER — Encounter: Payer: Self-pay | Admitting: General Practice

## 2020-01-25 LAB — POCT INR: INR: 2.5 — AB (ref <=1.1)

## 2020-01-25 LAB — PROTIME-INR: INR: 2.5 — AB (ref 0.9–1.1)

## 2020-01-26 ENCOUNTER — Telehealth: Payer: Self-pay

## 2020-01-26 NOTE — Telephone Encounter (Signed)
INR from 01/25/2020 was 2.5. Pt is currently taking 7.5 mg daily.   Abstracted.

## 2020-02-02 LAB — POCT INR: INR: 2.5 — AB (ref 0.9–1.1)

## 2020-02-03 ENCOUNTER — Telehealth: Payer: Self-pay

## 2020-02-03 NOTE — Telephone Encounter (Signed)
LMTCB. Need to ask pt what her regimen for her coumadin is.

## 2020-02-03 NOTE — Telephone Encounter (Signed)
LMTCB

## 2020-02-03 NOTE — Telephone Encounter (Signed)
Pt returned your call.  

## 2020-02-05 ENCOUNTER — Telehealth: Payer: Self-pay

## 2020-02-05 NOTE — Telephone Encounter (Signed)
INR for 02/02/2020 is 2.5. Pt is currently taking 7.5 mg. Pt stated that she has completed the antibiotic she was prescribed when diagnosed with covid.    Abstracted.

## 2020-02-11 LAB — POCT INR: INR: 3.4 — AB (ref 0.9–1.1)

## 2020-02-12 ENCOUNTER — Telehealth: Payer: Self-pay

## 2020-02-12 NOTE — Telephone Encounter (Signed)
INR for 02/11/2020 is 3.4. Pt is currently taking 7.5 mg daily.   Abstracted.

## 2020-02-23 LAB — POCT INR: INR: 1.6 — AB (ref 2.0–3.0)

## 2020-02-24 ENCOUNTER — Telehealth: Payer: Self-pay

## 2020-02-24 DIAGNOSIS — Z7901 Long term (current) use of anticoagulants: Secondary | ICD-10-CM

## 2020-02-24 NOTE — Telephone Encounter (Signed)
Pt is returning your call. She said if you don't need her you don't have to call her back.

## 2020-02-24 NOTE — Telephone Encounter (Signed)
INR is now too low on 7 mg daily.  Please take 10 mg tonight only,  And starting tomorrow  Resume 7.5 mg daily .  Recheck in one week

## 2020-02-24 NOTE — Assessment & Plan Note (Signed)
Your INR is now too low on 7 mg daily.  Please take 10 mg tonight only,  And starting tomorrow  Resume 7.5 mg daily .  Recheck in one week

## 2020-02-24 NOTE — Telephone Encounter (Signed)
Do not need to call pt back. Looked in pt's chart after leaving voicemail and realized that what I was calling about had already been addressed.

## 2020-02-24 NOTE — Telephone Encounter (Cosign Needed)
[facility] gave a stat result- 1.6 of the PT/INR

## 2020-02-24 NOTE — Telephone Encounter (Signed)
Patient has been informed.

## 2020-02-24 NOTE — Telephone Encounter (Signed)
error 

## 2020-03-08 ENCOUNTER — Telehealth: Payer: Self-pay | Admitting: Internal Medicine

## 2020-03-08 LAB — POCT INR: INR: 5.9 — AB (ref 2.0–3.0)

## 2020-03-08 NOTE — Telephone Encounter (Signed)
Spoke with pt and she stated that she does not know off the top of head what doses she has. Pt was advised that when she calls [place] with her INR result for tomorrow to let us know what doses she has at home.

## 2020-03-08 NOTE — Telephone Encounter (Signed)
[facility] called to report critical INR result-5.9 as of today

## 2020-03-08 NOTE — Telephone Encounter (Signed)
It looks like  She forgot to make the change on may 26 that I told her to do.  She was advised to reduce dose to 7 mg daily after her May 26 dose was a little high on 7.5 mg daily (see telephone note from may 26 ).  The other option at that time was to alternative 7.5 mg with 5 mg.    She should recheck her level tomorrow after suspending dose today .  Can Find out what doses of warfarin she has at home so I can decide what to do going forward ?  Thank you

## 2020-03-08 NOTE — Telephone Encounter (Signed)
Spoke with pt to verify her coumadin dose. Pt stated that she is taking 7.5mg  daily. She stated that she does not remember doubling the dose and she did state that she had been eating a lot of cucumbers but didn't think that would cause her INR to go up.

## 2020-03-08 NOTE — Telephone Encounter (Signed)
The cucumbers should not elevate the INR.  Confirm eating and has not been sick - (vomiting, diarrhea, etc).  Also confirm not taking any abx.  Have her double check her dose.  Confirm no bleeding.  Hold coumadin today.  Would like to recheck PT/INR tomorrow to confirm level and confirm not continuing to increase.  After pt notified, hold message to confirm f/u on results tomorrow.

## 2020-03-08 NOTE — Telephone Encounter (Signed)
FYI.  Pt is going to hold tonight and recheck tomorrow - just to confirm not continuing to increase.  She is not sure what she is taking.  She will confirm with Shanda Bumps tomorrow.

## 2020-03-08 NOTE — Telephone Encounter (Signed)
Spoke with pt and she stated that she has not eaten anything any different and has not been on any recent antibiotics. Pt did state that she does have some lingering symptoms from when she had covid. She stated that she still has a runny nose and when she blew her nose yesterday she stated that there was little specks of blood on the tissue. Pt confirmed again that she is taking the 7.5mg  dose daily. Pt was advised to hold her dose for today and recheck her level again tomorrow. Pt gave a verbal understanding.

## 2020-03-09 LAB — POCT INR: INR: 4.2 — AB (ref 2.0–3.0)

## 2020-03-17 LAB — POCT INR: INR: 2 — AB (ref 0.9–1.1)

## 2020-03-18 ENCOUNTER — Telehealth: Payer: Self-pay

## 2020-03-18 NOTE — Telephone Encounter (Signed)
INR result for 03/17/2020 is 2.0. Pt is currently taking 6 mg daily.  Abstracted.

## 2020-03-28 LAB — POCT INR: INR: 2.1 — AB (ref <=1.1)

## 2020-03-31 ENCOUNTER — Telehealth: Payer: Self-pay

## 2020-03-31 NOTE — Telephone Encounter (Signed)
INR for 03/28/2020 is 2.1. Pt is currently taking 6 mg daily.   Abstracted.

## 2020-04-14 ENCOUNTER — Telehealth: Payer: Self-pay

## 2020-04-14 LAB — POCT INR: INR: 2.1 — AB (ref 0.9–1.1)

## 2020-04-14 NOTE — Telephone Encounter (Signed)
INR for today is 2.1. Pt is currently taking 6 mg daily.    Abstracted.

## 2020-05-04 LAB — POCT INR: INR: 1.3 — AB (ref <=1.1)

## 2020-05-05 ENCOUNTER — Telehealth: Payer: Self-pay

## 2020-05-05 NOTE — Telephone Encounter (Signed)
Advised to take 12 mg tonight,  6 mg daily thereafter and recheck 1 week  MyChart message sent

## 2020-05-05 NOTE — Telephone Encounter (Signed)
INR from 05/04/2020 is 1.3. Pt is currently taking 6 mg daily but stated that she missed a dose last week while she was at camp.

## 2020-05-12 ENCOUNTER — Telehealth: Payer: Self-pay

## 2020-05-12 LAB — POCT INR: INR: 1.9 — AB (ref 0.9–1.1)

## 2020-05-12 NOTE — Telephone Encounter (Signed)
LMTCB. Need to find out pt's coumadin regimen.

## 2020-05-12 NOTE — Telephone Encounter (Signed)
Noted telephone encounter has been sent to Dr. Darrick Huntsman in regards.

## 2020-05-12 NOTE — Telephone Encounter (Signed)
INR result for today is 1.9. Pt stated that she is taking 6 mg daily. Abstracted.

## 2020-05-12 NOTE — Telephone Encounter (Signed)
Advised to continue 6 mg daily and recheck in one week  Via mychart

## 2020-05-12 NOTE — Telephone Encounter (Signed)
Was unable to reach pt by phone to confirm coumadin dose so I sent pt a mychart message.

## 2020-05-13 NOTE — Telephone Encounter (Signed)
Pt read mychart message.

## 2020-05-13 NOTE — Telephone Encounter (Signed)
error 

## 2020-05-20 LAB — POCT INR: INR: 1.9 — AB (ref 0.9–1.1)

## 2020-05-21 ENCOUNTER — Telehealth: Payer: Self-pay

## 2020-05-21 ENCOUNTER — Telehealth: Payer: Self-pay | Admitting: Internal Medicine

## 2020-05-21 DIAGNOSIS — Z7901 Long term (current) use of anticoagulants: Secondary | ICD-10-CM

## 2020-05-21 NOTE — Telephone Encounter (Signed)
INR for 05/20/2020 is 1.9. Pt is currently taking 6 mg daily.   Abstracted.

## 2020-05-21 NOTE — Assessment & Plan Note (Signed)
Your INr is 1.9 on 6 mg daily, please  increase dose to 7 mg every other day alternating with 6 mg

## 2020-05-28 ENCOUNTER — Other Ambulatory Visit: Payer: Self-pay | Admitting: Internal Medicine

## 2020-05-28 ENCOUNTER — Telehealth: Payer: Self-pay | Admitting: Internal Medicine

## 2020-05-28 DIAGNOSIS — Z7901 Long term (current) use of anticoagulants: Secondary | ICD-10-CM

## 2020-05-28 LAB — POCT INR: INR: 1.1 (ref 0.9–1.1)

## 2020-05-28 NOTE — Telephone Encounter (Signed)
Spoke with pt and informed her of her new warfarin regimen. Pt gave a verbal understanding.

## 2020-05-28 NOTE — Telephone Encounter (Signed)
  INR is low at 1.1 on 6 mg alt with 7 mg.  New regimen:  Only for Tonight take 9 mg,  And starting on Saturday,  Take 7 mg daily.  Repeat in one week

## 2020-05-28 NOTE — Telephone Encounter (Signed)
Spoke with pt and she stated that she is alternating 6 mg and 7 mg every other day. Pt stated that the only thing that she can think that she did was maybe take the wrong dose one night but not sure.

## 2020-05-28 NOTE — Assessment & Plan Note (Signed)
  INR is low at 1.1 on 6 mg alt with 7 mg.  New regimen:  Only for Tonight take 9 mg,  And starting on Saturday,  Take 7 mg daily.  Repeat in one week  

## 2020-05-28 NOTE — Telephone Encounter (Signed)
[facility] called stating the patient's INR is out of range it 1.1.

## 2020-05-29 ENCOUNTER — Other Ambulatory Visit: Payer: Self-pay | Admitting: Internal Medicine

## 2020-06-06 LAB — POCT INR

## 2020-06-07 ENCOUNTER — Telehealth: Payer: Self-pay | Admitting: Internal Medicine

## 2020-06-07 DIAGNOSIS — Z7901 Long term (current) use of anticoagulants: Secondary | ICD-10-CM

## 2020-06-07 NOTE — Assessment & Plan Note (Signed)
Your INR has increased from 1.1 to 1.9 on 7 mg daily Our Goal is >2 but < 3 . Start the new regimen of 7 mg daily 5 days per week and and take 8 mg   2 days per week .  Repeat inr in one week

## 2020-06-14 LAB — POCT INR: INR: 2.1 — AB (ref 0.9–1.1)

## 2020-06-16 ENCOUNTER — Telehealth: Payer: Self-pay

## 2020-06-16 NOTE — Telephone Encounter (Signed)
INR result for 06/14/2020 is 2.1. Pt is currently taking 7.5 mg daily.   Abstracted.

## 2020-06-28 ENCOUNTER — Telehealth: Payer: Self-pay

## 2020-06-28 LAB — POCT INR: INR: 3 — AB (ref 0.9–1.1)

## 2020-06-28 NOTE — Telephone Encounter (Signed)
INR result for 06/28/2020 is 3.0. Pt is taking 7.5 mg daily. Abstracted.

## 2020-07-06 ENCOUNTER — Other Ambulatory Visit: Payer: Self-pay | Admitting: Internal Medicine

## 2020-07-10 LAB — POCT INR: INR: 2.6 — AB (ref 0.9–1.1)

## 2020-07-12 ENCOUNTER — Telehealth: Payer: Self-pay

## 2020-07-12 ENCOUNTER — Telehealth: Payer: Self-pay | Admitting: Internal Medicine

## 2020-07-12 DIAGNOSIS — Z1231 Encounter for screening mammogram for malignant neoplasm of breast: Secondary | ICD-10-CM

## 2020-07-12 NOTE — Telephone Encounter (Signed)
INR for 07/10/2020 is 2.6. Pt is currently taking 7.5mg  daily.   Abstracted.

## 2020-07-12 NOTE — Telephone Encounter (Signed)
Patient called in need a referral a mammogram

## 2020-07-12 NOTE — Telephone Encounter (Signed)
Mammogram has been ordered and mychart message has been sent to pt.

## 2020-07-19 LAB — POCT INR: INR: 2.7 — AB (ref 0.9–1.1)

## 2020-07-20 ENCOUNTER — Telehealth: Payer: Self-pay

## 2020-07-20 NOTE — Telephone Encounter (Signed)
INR result for 07/19/2020 is 2.7. Pt is currently taking 7.5 mg daily.   Abstracted.

## 2020-08-01 LAB — POCT INR: INR: 2.2 — AB (ref 0.9–1.1)

## 2020-08-02 ENCOUNTER — Telehealth: Payer: Self-pay

## 2020-08-02 NOTE — Telephone Encounter (Signed)
INR result for 08/01/2020 is 2.2. Pt is currently taking 7.5mg . Abstracted.

## 2020-08-10 LAB — POCT INR: INR: 2.7 — AB (ref 0.9–1.1)

## 2020-08-11 ENCOUNTER — Telehealth: Payer: Self-pay

## 2020-08-11 NOTE — Telephone Encounter (Signed)
Spoke with pt to let her know that INR is therapeutic and to continue current dose. Also let her know that the letter is available throught mychart that she has requested. Pt gave a verbal understanding.

## 2020-08-11 NOTE — Telephone Encounter (Signed)
  Your INR/coumadin level is therapeutic,  Continue current regimen and repeat PT/INR in one month.   THE LETTER SHE REQUESTED WILL BE DONE IN THE NEAR FUTURE AND  DOWNLOADED TO [facility]

## 2020-08-11 NOTE — Telephone Encounter (Signed)
INR result for 08/10/2020 is 2.7. Pt is currently taking 7.5 mg daily.   Abstracted.

## 2020-08-17 NOTE — Telephone Encounter (Signed)
Pt called and is needing a hard copy of the forms to pick-up

## 2020-08-21 LAB — POCT INR: INR: 1.8 — AB (ref 0.9–1.1)

## 2020-08-23 ENCOUNTER — Telehealth: Payer: Self-pay

## 2020-08-23 DIAGNOSIS — Z7901 Long term (current) use of anticoagulants: Secondary | ICD-10-CM

## 2020-08-23 NOTE — Addendum Note (Signed)
Addended by: Sherlene Shams on: 08/23/2020 11:09 AM   Modules accepted: Orders

## 2020-08-23 NOTE — Telephone Encounter (Signed)
INR result for 08/21/2020 is 1.8. Pt is currently taking 7.5 mg daily.  Abstracted.

## 2020-08-23 NOTE — Assessment & Plan Note (Addendum)
Coumadin level (INR) is low at 1.8 (goal is 2.0 to 3.0)  on 7.5 mg daily. Patient advised to increase dose to 8 mg on Mond Wed and Fridays and continue 7.5 mg all other days.  Repeat INR in 2 weeks

## 2020-08-23 NOTE — Telephone Encounter (Signed)
MyChart message sent :  Coumadin level (INR) is low at 1.8 (goal is 2.0 to 3.0)  on 7.5 mg daily. Patient advised to increase dose to 8 mg on Mond Wed and Fridays and continue 7.5 mg all other days.  Repeat INR in 2 weeks

## 2020-08-31 LAB — POCT INR: INR: 2.3 (ref 2.0–3.0)

## 2020-09-02 ENCOUNTER — Ambulatory Visit
Admission: RE | Admit: 2020-09-02 | Discharge: 2020-09-02 | Disposition: A | Discharge status: Home / Self Care | Payer: 59 | Source: Ambulatory Visit | Attending: Internal Medicine | Admitting: Internal Medicine

## 2020-09-02 ENCOUNTER — Other Ambulatory Visit: Payer: Self-pay

## 2020-09-02 DIAGNOSIS — Z1231 Encounter for screening mammogram for malignant neoplasm of breast: Secondary | ICD-10-CM | POA: Diagnosis not present

## 2020-09-13 LAB — POCT INR: INR: 2.7 — AB (ref 0.9–1.1)

## 2020-09-14 ENCOUNTER — Telehealth: Payer: Self-pay

## 2020-09-14 NOTE — Telephone Encounter (Signed)
Sent mychart message to clarify what dose of warfarin pt is taking.

## 2020-09-16 NOTE — Telephone Encounter (Signed)
Pt had not read mychart message so I called her. Pt's INR for 09/13/2020 was 2.7. Pt stated that she is taking 7.5 mg Monday, Tuesday and Wednesday and taking 8 mg all other days.   Abstracted.

## 2020-09-16 NOTE — Telephone Encounter (Signed)
Coumadin level is therapeutic,  Continue current regimen and repeat PT/INR in one month 

## 2020-09-16 NOTE — Telephone Encounter (Signed)
Spoke with pt and advised her to continue her coumadin dose she is currently taking. Pt gave a verbal understanding.

## 2020-09-27 LAB — POCT INR: INR: 2.5 — AB (ref 0.9–1.1)

## 2020-09-28 ENCOUNTER — Telehealth: Payer: Self-pay

## 2020-09-28 NOTE — Telephone Encounter (Signed)
Spoke with pt and let her know that she needs to continue her current coumadin dose.

## 2020-09-28 NOTE — Telephone Encounter (Signed)
Coumadin level is therapeutic,  Continue current regimen and repeat PT/INR in one week  MyChart message sent  

## 2020-09-28 NOTE — Telephone Encounter (Signed)
Pt's INR for 09/27/2020 is 2.5. pt is currently taking 7.5 mg on Monday, Tuesday and Wednesday and 8 mg on all other days.   Abstracted.

## 2020-10-07 LAB — POCT INR: INR: 2.7 — AB (ref 0.9–1.1)

## 2020-10-08 ENCOUNTER — Telehealth: Payer: Self-pay

## 2020-10-08 NOTE — Telephone Encounter (Signed)
INR result for 10/07/2020 was 2.7. Pt is currently taking 7.5 mg Monday, Tuesday and Wednesday and 8 mg all other days.   Abstracted.

## 2020-10-19 LAB — POCT INR: INR: 2.2 — AB (ref 0.9–1.1)

## 2020-10-20 ENCOUNTER — Telehealth: Payer: Self-pay | Admitting: Internal Medicine

## 2020-10-20 NOTE — Telephone Encounter (Signed)
Patient INR received is 2.2 with patient current regimen  taking 7.5 mg Monday, Tuesday and Wednesday and 8 mg all other days.

## 2020-10-24 LAB — POCT INR: INR: 2.6 — AB (ref <=1.1)

## 2020-11-01 LAB — POCT INR: INR: 2.8 — AB (ref 0.9–1.1)

## 2020-11-02 ENCOUNTER — Telehealth: Payer: Self-pay

## 2020-11-02 NOTE — Telephone Encounter (Signed)
MyChart message sent :  Coumadin level is therapeutic,  Continue current regimen and repeat PT/INR in 2 week

## 2020-11-02 NOTE — Telephone Encounter (Signed)
Pt's INR for 11/01/2020 was 2.8. Pt is currently taking 7.5 mg Tuesday and Wednesday and 8 mg all other days.   Abstracted.

## 2020-11-16 ENCOUNTER — Telehealth: Payer: Self-pay

## 2020-11-16 LAB — POCT INR: INR: 2.4 — AB (ref 0.9–1.1)

## 2020-11-16 NOTE — Telephone Encounter (Signed)
My Chart message sent

## 2020-11-16 NOTE — Telephone Encounter (Signed)
INR result for 11/16/2020 is 2.4. Pt is currently taking 7.5 mg Tuesday and Wednesday and 8 mg all other days.   Abstracted.

## 2020-11-18 MED ORDER — WARFARIN SODIUM 7.5 MG PO TABS: 7.5000 mg | ORAL_TABLET | Freq: Every day | ORAL | 1 refills | Status: DC

## 2020-11-18 NOTE — Telephone Encounter (Signed)
Medication has been refilled.

## 2020-12-06 LAB — POCT INR: INR: 3 — AB (ref 0.9–1.1)

## 2020-12-07 ENCOUNTER — Telehealth: Payer: Self-pay

## 2020-12-07 NOTE — Telephone Encounter (Signed)
INR result for 12/06/2020 is 3.0. Pt is currently taking 7.5 mg Tuesday and Wednesday and 8 mg all other days.   Abstracted.

## 2020-12-12 ENCOUNTER — Telehealth: Payer: Self-pay | Admitting: Internal Medicine

## 2020-12-23 ENCOUNTER — Telehealth: Payer: Self-pay

## 2020-12-23 LAB — POCT INR: INR: 2 — AB (ref <=1.1)

## 2020-12-23 NOTE — Telephone Encounter (Signed)
MyChart message sent :"  Big drop in INR  Have you missed any doses?"

## 2020-12-23 NOTE — Telephone Encounter (Signed)
INR for 12/23/2020 is 2.0. Pt is currently taking 7.5 mg Tuesday and Wednesday and 8 mg all other days.   Abstracted.

## 2021-01-03 ENCOUNTER — Telehealth: Payer: Self-pay

## 2021-01-03 LAB — POCT INR: INR: 2.7 — AB (ref <=1.1)

## 2021-01-03 NOTE — Telephone Encounter (Signed)
INR result for 01/03/2021 is 2.7. Pt is currently taking 8 mg two times a week and 7.5 mg five days a week.   Abstracted.

## 2021-01-04 ENCOUNTER — Telehealth: Payer: Self-pay | Admitting: Internal Medicine

## 2021-01-04 MED ORDER — WARFARIN SODIUM 4 MG PO TABS: 8.0000 mg | ORAL_TABLET | Freq: Every day | ORAL | 2 refills | Status: DC

## 2021-01-04 NOTE — Addendum Note (Signed)
Addended by: Sherlene Shams on: 01/04/2021 10:05 AM   Modules accepted: Orders

## 2021-01-04 NOTE — Telephone Encounter (Signed)
MyChart message sent re INR

## 2021-01-04 NOTE — Telephone Encounter (Signed)
MY CHART MESSAGE SENT TO PT

## 2021-01-04 NOTE — Telephone Encounter (Signed)
Coumadin level is therapeutic,  Continue current regimen and repeat PT/INR in one WEEK.    I have sent an rx for 4 mg tablets to your pharmacy so she can use 2 for the 8 mg dose . (not available in 8 mg tablets)

## 2021-01-18 LAB — POCT INR: INR: 2.6 — AB (ref 0.9–1.1)

## 2021-01-19 ENCOUNTER — Telehealth: Payer: Self-pay

## 2021-01-19 NOTE — Telephone Encounter (Signed)
INR result for 01/18/2021 was 2.6. Pt is currently taking 8 mg two days a week and 7.5 mg five days a week.   Abstracted.

## 2021-01-27 LAB — POCT INR: INR: 2.2 — AB (ref 0.9–1.1)

## 2021-02-01 ENCOUNTER — Telehealth: Payer: Self-pay | Admitting: Internal Medicine

## 2021-02-01 NOTE — Telephone Encounter (Signed)
Patient INR 01/28/21 ins 2.2 patient curently taking 8 mg two days a week and 7.5 mg five days a week.

## 2021-02-07 ENCOUNTER — Telehealth: Payer: Self-pay

## 2021-02-07 DIAGNOSIS — Z7901 Long term (current) use of anticoagulants: Secondary | ICD-10-CM

## 2021-02-07 LAB — POCT INR: INR: 3 — AB (ref 0.9–1.1)

## 2021-02-07 NOTE — Assessment & Plan Note (Signed)
INRis 3.0 on regimen of    8.0 mg x 2,  7.5 mg x 5

## 2021-02-07 NOTE — Telephone Encounter (Signed)
Pt's INR is 3.0 for today. Pt is currently taking 8 mg two days a week and 7.5 mg five days a week.  Abstracted.

## 2021-02-17 LAB — PROTIME-INR: INR: 3.6 — AB (ref 0.9–1.1)

## 2021-02-18 ENCOUNTER — Telehealth: Payer: Self-pay

## 2021-02-18 LAB — POCT INR: INR: 3.6 — AB (ref <=1.1)

## 2021-02-18 NOTE — Telephone Encounter (Signed)
Spoke with pt and informed her of her INR result. Pt stated that she is not and has not been on antibiotics. Pt was advised to skip her dose for tonight and then resume taking 7.5 mg daily. Pt gave a verbal understanding.

## 2021-02-18 NOTE — Telephone Encounter (Signed)
[facility] called with out of range INR. Fax has also been sent to inform provider.  3.6 INR result.

## 2021-02-18 NOTE — Telephone Encounter (Signed)
Her INR is high.  If she is taking antibiotics for tick bite,  Suspend coumadin for 2 nights and  then resume at 7.5 mg daily.   If NOT taking antibiotics, skip for one night,  Then resume at 7.5 mg daily  Repeat iNR one week

## 2021-03-03 LAB — POCT INR: INR: 2.7 — AB (ref 0.9–1.1)

## 2021-03-04 ENCOUNTER — Telehealth: Payer: Self-pay

## 2021-03-04 NOTE — Telephone Encounter (Signed)
INR result for 03/03/2021 is 2.7. Pt is currently taking 7.5 mg daily.   Abstracted.

## 2021-03-14 ENCOUNTER — Telehealth: Payer: Self-pay

## 2021-03-14 DIAGNOSIS — Z7901 Long term (current) use of anticoagulants: Secondary | ICD-10-CM

## 2021-03-14 LAB — POCT INR: INR: 3.8 — AB (ref <=1.1)

## 2021-03-14 NOTE — Telephone Encounter (Signed)
LMTCB

## 2021-03-14 NOTE — Telephone Encounter (Signed)
Pt's INR for 03/13/21 is 3.8.   I called pt and left a message for her to call back and sent a mychart message to pt asking her to clarify how she is taking her coumadin.    Abstracted.

## 2021-03-14 NOTE — Telephone Encounter (Signed)
Received a call from [facility] to report an out of range INR. They hung up before the call could be transferred to me. INR labs from 03/03/2021 resulted at 2.7.

## 2021-03-14 NOTE — Telephone Encounter (Signed)
FYI tullo  Jessica  Call pt   Confirm she is not on antibiotic currently  INR 3.8,  prior 3.6  INR had been controlled 1 month ago   H/o recurrent DVT   Confirm taking coumadin 7.5mg  QD.   Advise pt to skip for one night, then resume at 7.5 mg daily   Repeat INR one week as coumadin dose may need to be reduced.    Pt ensure pt checks INR in one weeks time and calls with result

## 2021-03-16 MED ORDER — WARFARIN SODIUM 6 MG PO TABS: 6.0000 mg | ORAL_TABLET | Freq: Every day | ORAL | 1 refills | Status: DC

## 2021-03-16 NOTE — Addendum Note (Signed)
Addended by: Sherlene Shams on: 03/16/2021 12:06 PM   Modules accepted: Orders

## 2021-03-16 NOTE — Assessment & Plan Note (Signed)
INR 3.8 on 7.5 mg daily. Suspend for 2 days,  Then 6 mg daily.  Repeat INR after 7 days of 6 mg dose

## 2021-03-21 LAB — POCT INR: INR: 2 — AB (ref <=1.1)

## 2021-03-22 ENCOUNTER — Telehealth: Payer: Self-pay | Admitting: Internal Medicine

## 2021-03-22 NOTE — Telephone Encounter (Signed)
Patient notified and voiced understanding she will recheck in two weeks but MD- INR requires weekly check as well.

## 2021-03-22 NOTE — Telephone Encounter (Signed)
INR results 03/21/21 INR = 2.0  patient taking 6 mg coumadin daily now.

## 2021-04-03 LAB — POCT INR: INR: 2.1 — AB (ref <=1.1)

## 2021-04-06 ENCOUNTER — Telehealth: Payer: Self-pay

## 2021-04-06 NOTE — Telephone Encounter (Signed)
INR result for 04/03/2021 is 2.1. Pt is currently taking 6 mg coumadin daily.   Abstracted.

## 2021-04-13 ENCOUNTER — Encounter: Payer: Self-pay | Admitting: Internal Medicine

## 2021-04-13 LAB — POCT INR: INR: 2.6 (ref 2.0–3.0)

## 2021-04-14 ENCOUNTER — Telehealth: Payer: Self-pay

## 2021-04-14 DIAGNOSIS — Z7901 Long term (current) use of anticoagulants: Secondary | ICD-10-CM

## 2021-04-14 NOTE — Telephone Encounter (Signed)
INR result for 04/13/2021 is 2.6. Pt is currently taking 6 mg daily.   Abstracted.

## 2021-04-15 NOTE — Telephone Encounter (Signed)
Mycahrt message has been sent.

## 2021-04-15 NOTE — Assessment & Plan Note (Signed)
inr is 2.6 on 6 mg coumadin daily April 13 2021.  No changes made

## 2021-04-22 LAB — POCT INR: INR: 1.6 — AB (ref 0.9–1.1)

## 2021-04-25 ENCOUNTER — Telehealth: Payer: Self-pay

## 2021-04-25 NOTE — Telephone Encounter (Signed)
INR result for 04/22/2021 is 1.6. Pt is currently taking 6 mg daily of coumadin.   Abstracted.

## 2021-04-25 NOTE — Telephone Encounter (Signed)
[facility] Hung up telephone call.

## 2021-04-25 NOTE — Telephone Encounter (Signed)
Sabrina Woodard,  Please ensure pt checks INR in one week's time  FYI Dr Darrick Huntsman  Consulted with Dr Birdie Sons as well  Sabrina Woodard spoke with pt and she missed a dose, one dose last week, 04/20/21  No antibiotics or steroids.   Advised to continue coumadin 6mg  qd for now.

## 2021-04-26 NOTE — Telephone Encounter (Signed)
FYI Sabrina Woodard,  Please return patient's call. Let her know that I consulted with tullo and we agree NOT to make any changes to coumadin. She does NOT need to double dose per her phone call.   She  may continue coumadin 6mg  daily. Repeat INR in one week

## 2021-04-26 NOTE — Telephone Encounter (Signed)
LMTCB

## 2021-04-26 NOTE — Telephone Encounter (Signed)
Patient is returning your call.She checked her pt inr at home and forgot her dose of medicine and wants to see if she should double her dose.

## 2021-04-26 NOTE — Telephone Encounter (Signed)
Pt is aware that she needs to double her dose tonight.

## 2021-04-26 NOTE — Telephone Encounter (Signed)
Spoke with pt and she stated that she is at a camp with a bunch of kids this week and she forgot to take her dose of Coumadin again last night. Pt is wanting to know if she needs to double her dose tonight or just go back to taking the 6 mg daily. She stated that she has set an alarm on her home to remind her tonight. Pt is unable to check INR again until her returns home on Friday.

## 2021-05-02 LAB — POCT INR: INR: 1.7 — AB (ref 0.9–1.1)

## 2021-05-04 ENCOUNTER — Telehealth: Payer: Self-pay

## 2021-05-04 DIAGNOSIS — Z7901 Long term (current) use of anticoagulants: Secondary | ICD-10-CM

## 2021-05-04 MED ORDER — WARFARIN SODIUM 1 MG PO TABS
1.0000 mg | ORAL_TABLET | Freq: Every day | ORAL | 1 refills | Status: DC
Start: 2021-05-04 — End: 2022-02-08

## 2021-05-04 NOTE — Telephone Encounter (Signed)
Patient was at kids camp last week. Her diet may have been off. Appointment has been scheduled with PCP next Thursday at 1230.

## 2021-05-04 NOTE — Telephone Encounter (Signed)
INR result for 05/02/2021 is 1.7. Pt stated that the last time she spoke with uf she was told to double her dose for one night. She stated that she did the 12 mg that one night and then went back to taking the 6 mg daily. Pt stated she has not missed any more doses.   Abstracted.

## 2021-05-04 NOTE — Telephone Encounter (Signed)
Something may be wrong with her machine,  or she has changed her diet.  NR has not been this OFF in  months.  Have her take 12 mg tonight and 7 mg starting tomorrow needs IIN OFFICE IN ONE WEEK

## 2021-05-05 NOTE — Telephone Encounter (Signed)
Patient is returning your call.  

## 2021-05-05 NOTE — Telephone Encounter (Signed)
Left message for patient to return call back. Patient needs a lab appointment not an office visit. Need to change appointment type and tell patient.

## 2021-05-09 LAB — POCT INR: INR: 2.2 — AB (ref <=1.1)

## 2021-05-11 ENCOUNTER — Encounter: Payer: Self-pay | Admitting: Internal Medicine

## 2021-05-11 LAB — POCT INR: INR: 2.2 (ref 2.0–3.0)

## 2021-05-12 ENCOUNTER — Other Ambulatory Visit (INDEPENDENT_AMBULATORY_CARE_PROVIDER_SITE_OTHER): Payer: 59

## 2021-05-12 ENCOUNTER — Ambulatory Visit: Payer: 59 | Admitting: Internal Medicine

## 2021-05-12 ENCOUNTER — Other Ambulatory Visit: Payer: Self-pay

## 2021-05-12 DIAGNOSIS — Z7901 Long term (current) use of anticoagulants: Secondary | ICD-10-CM | POA: Diagnosis not present

## 2021-05-12 LAB — PROTIME-INR
INR: 2.3 ratio — ABNORMAL HIGH (ref 0.8–1.0)
Prothrombin Time: 23.8 s — ABNORMAL HIGH (ref 9.6–13.1)

## 2021-05-13 ENCOUNTER — Telehealth: Payer: Self-pay

## 2021-05-13 NOTE — Telephone Encounter (Signed)
INR result from 05/11/2021 is 2.2 and that was with her home machine. Pt is currently taking 7 mg daily.    Abstracted.

## 2021-05-18 ENCOUNTER — Other Ambulatory Visit: Payer: Self-pay | Admitting: Internal Medicine

## 2021-05-23 LAB — POCT INR: INR: 2 — AB (ref 0.9–1.1)

## 2021-05-26 ENCOUNTER — Telehealth: Payer: Self-pay

## 2021-05-26 NOTE — Telephone Encounter (Signed)
INR result for 05/23/2021 is 2.0. Pt is currently taking 7 mg daily.   Abstracted.

## 2021-06-01 LAB — POCT INR: INR: 2.8 — AB (ref 0.9–1.1)

## 2021-06-02 ENCOUNTER — Telehealth: Payer: Self-pay

## 2021-06-02 NOTE — Telephone Encounter (Signed)
Pt's INR result for 06/01/2021 was 2.8. Pt is currently taking 8 mg on Tuesday and Thursday 7mg  all other days.  Abstracted

## 2021-06-03 NOTE — Telephone Encounter (Signed)
Patient has been notified

## 2021-06-11 LAB — POCT INR: INR: 2.9 — AB (ref 0.9–1.1)

## 2021-06-13 ENCOUNTER — Telehealth: Payer: Self-pay

## 2021-06-13 NOTE — Telephone Encounter (Signed)
INR result for 06/11/2021 was 2.9. pt is currently taking  8 mg on Tuesday and Thursday 7mg  all other days.  Abstracted.

## 2021-06-17 ENCOUNTER — Other Ambulatory Visit: Payer: Self-pay | Admitting: Internal Medicine

## 2021-06-22 LAB — POCT INR: INR: 2.2 — AB (ref 0.9–1.1)

## 2021-06-24 ENCOUNTER — Telehealth: Payer: Self-pay

## 2021-06-24 NOTE — Telephone Encounter (Signed)
Mycahrt message sent

## 2021-06-24 NOTE — Telephone Encounter (Signed)
INR result for 06/22/2021 is 2.2. Pt is currently taking 8 mg on Tuesday and Thursday 7mg  all other days.  Abstracted.

## 2021-07-31 LAB — POCT INR: INR: 2.1 — AB (ref 0.9–1.1)

## 2021-08-03 ENCOUNTER — Telehealth: Payer: Self-pay

## 2021-08-03 NOTE — Telephone Encounter (Signed)
LMTCB. Need to find out how pt is taking her coumadin.  ?

## 2021-08-05 NOTE — Telephone Encounter (Signed)
Pt never returned call to let us know what her coumadin regimen is. Her INR for 07/31/2021 was 2.1.    Abstracted.

## 2021-08-16 LAB — POCT INR: INR: 2 — AB (ref 0.9–1.1)

## 2021-08-18 ENCOUNTER — Telehealth: Payer: Self-pay

## 2021-08-18 NOTE — Telephone Encounter (Signed)
Please increase dose to 8 mg on Saturday and continue 6 mg on Mond Wed Fri and Sunday  (MyChart message sent  )

## 2021-08-18 NOTE — Telephone Encounter (Signed)
INR result for 08/16/2021 is 2.0. Pt is currently taking 8mg  on Tuesday and Thursday. All other days on 6mg .  Abstracted.

## 2021-08-29 LAB — POCT INR: INR: 2.2 — AB (ref 0.9–1.1)

## 2021-08-30 ENCOUNTER — Telehealth: Payer: Self-pay

## 2021-08-30 NOTE — Telephone Encounter (Signed)
Mychart message has been sent.  

## 2021-08-30 NOTE — Telephone Encounter (Signed)
INR result for 08/29/2021 is 2.2. Pt is currently taking 8 mg on Saturday and 6 mg all other days.   Abstracted.

## 2021-09-14 LAB — POCT INR: INR: 2.4 — AB (ref <=1.1)

## 2021-09-17 ENCOUNTER — Other Ambulatory Visit: Payer: Self-pay | Admitting: Internal Medicine

## 2021-09-20 ENCOUNTER — Telehealth: Payer: Self-pay

## 2021-09-20 NOTE — Telephone Encounter (Signed)
INR result for 09/14/2021 was 2.4. Pt is currently taking 8 mg on Saturday and 6 mg all other days.  Abstracted.

## 2021-09-20 NOTE — Telephone Encounter (Signed)
LMTCB

## 2021-09-26 LAB — POCT INR: INR: 1.7 — AB (ref <=1.1)

## 2021-09-28 ENCOUNTER — Telehealth: Payer: Self-pay

## 2021-09-28 NOTE — Telephone Encounter (Signed)
[name] from INR called in stating that she is reporting INR results on Pt. [name] stated that INR is out of range. [name] stated that the result is reporting a 1.7 on Jan 9th. [name] stated that results was faxed over on Jan 9th as well.

## 2021-09-28 NOTE — Telephone Encounter (Signed)
INR result for 09/26/2021 is 1.7. Pt is currently taking 8 mg on Saturday and 6 mg all other days.   Abstracted.

## 2021-09-28 NOTE — Telephone Encounter (Signed)
LMTCB and also sent a mychart message 

## 2021-10-09 ENCOUNTER — Encounter: Payer: Self-pay | Admitting: Internal Medicine

## 2021-10-09 LAB — PROTIME-INR: INR: 2.2 — AB (ref 0.9–1.1)

## 2021-10-10 ENCOUNTER — Telehealth: Payer: Self-pay

## 2021-10-23 LAB — POCT INR: INR: 2.7 — AB (ref 0.9–1.1)

## 2021-10-25 ENCOUNTER — Telehealth: Payer: Self-pay

## 2021-10-25 NOTE — Telephone Encounter (Signed)
INR result for 10/23/2021 was 2.7. Pt is currently taking 7 mg daily.   Abstracted.

## 2021-10-25 NOTE — Telephone Encounter (Signed)
My chart message sent to pt.

## 2021-11-07 ENCOUNTER — Telehealth: Payer: Self-pay

## 2021-11-07 LAB — POCT INR: INR: 1.3 — AB (ref <=1.1)

## 2021-11-07 NOTE — Telephone Encounter (Signed)
See previous telephone encounter.

## 2021-11-07 NOTE — Telephone Encounter (Signed)
Earlena from [facility] called in to report Pt results.Manju stated Pt INR is 1.3 as of today.

## 2021-11-07 NOTE — Telephone Encounter (Signed)
Spoke with pt and advised her to increase the dose to 10 mg tonight and tomorrow and then resume the 7 mg daily. Recheck INR in one week. Pt gave a verbal understanding.

## 2021-11-07 NOTE — Telephone Encounter (Signed)
INR result for 11/07/2021 is 1.3. Pt is currently taking 7 mg daily. Pt stated that she had a stomach bug this past week and was more dehydrated than usual.   Abstracted.

## 2021-11-20 LAB — POCT INR: INR: 2.6 — AB (ref 0.9–1.1)

## 2021-11-21 ENCOUNTER — Telehealth: Payer: Self-pay

## 2021-11-21 DIAGNOSIS — Z7901 Long term (current) use of anticoagulants: Secondary | ICD-10-CM

## 2021-11-21 NOTE — Telephone Encounter (Signed)
My Chart message sent

## 2021-11-21 NOTE — Telephone Encounter (Signed)
INR for 11/20/2021 is 2.6. Pt is currently taking 7 mg daily.  ? ?Abstracted.  ?

## 2021-12-06 LAB — POCT INR: INR: 2.6 — AB (ref <=1.20)

## 2021-12-08 ENCOUNTER — Telehealth: Payer: Self-pay

## 2021-12-08 NOTE — Telephone Encounter (Signed)
INR result for 12/06/2021 is 2.6. Pt is currently taking 7 mg daily.  ? ?Abstracted. ?

## 2021-12-18 LAB — POCT INR: INR: 2.2 — AB (ref 0.80–1.20)

## 2021-12-21 ENCOUNTER — Telehealth: Payer: Self-pay

## 2021-12-21 DIAGNOSIS — Z7901 Long term (current) use of anticoagulants: Secondary | ICD-10-CM

## 2021-12-21 NOTE — Telephone Encounter (Signed)
My Chart message sent

## 2021-12-21 NOTE — Assessment & Plan Note (Signed)
Continue 7 mg daily ? ?Lab Results  ?Component Value Date  ? INR 2.20 (A) 12/18/2021  ? INR 2.60 (A) 12/06/2021  ? INR 2.6 (A) 11/20/2021  ? ? ?

## 2021-12-21 NOTE — Telephone Encounter (Signed)
INR result for 12/18/2021 is 2.2. pt is currently taking 7 mg daily.  ? ?Abstracted. ?

## 2022-01-01 LAB — POCT INR: INR: 2.2 — AB (ref 0.80–1.20)

## 2022-01-03 ENCOUNTER — Telehealth: Payer: Self-pay

## 2022-01-03 NOTE — Telephone Encounter (Signed)
INR result for 01/01/2022 is 2.2. Pt is currently taking 7 mg daily.  ? ?Abstracted.  ?

## 2022-01-19 NOTE — Telephone Encounter (Signed)
LMTCB. Need to remind pt that she is supposed to be checking her INR once weekly. ?

## 2022-01-19 NOTE — Telephone Encounter (Signed)
Sabrina Woodard called in stating that she tried to reach out to pt, no response from pt... Sabrina Woodard stated that the pt suppose to be testing for INR weekly... Sabrina Woodard stated that if anyone get in touch with pt to advise pt that she needs to do her testing for INR.Marland KitchenMarland Kitchen  ?

## 2022-01-20 ENCOUNTER — Telehealth: Payer: Self-pay

## 2022-01-20 NOTE — Telephone Encounter (Signed)
INR is acceptable. She should continue her current dose of coumadin. It looks like she is checking it every 2 weeks at home. She should recheck it in 2 weeks as she has been doing.  ?

## 2022-01-20 NOTE — Telephone Encounter (Signed)
Patient currently taking 7 mg of Coumadin daly. ? ?Today's INR: 2.9 ?Last INR 01/01/22 2.2 ?

## 2022-01-20 NOTE — Telephone Encounter (Signed)
Pt called back and I read the message to her and she stated she know she need to keep up with the readings. Pt said she did check it last night and sent results in ?

## 2022-01-20 NOTE — Telephone Encounter (Signed)
LMTCB to relay below message.

## 2022-01-23 NOTE — Telephone Encounter (Signed)
LMTCB

## 2022-01-23 NOTE — Telephone Encounter (Signed)
Pt called back & message relayed. Pt understood that actually per Dr. Darrick Huntsman & insurance she should be doing weekly.  ?

## 2022-01-23 NOTE — Telephone Encounter (Signed)
Mychart message also sent to patient.

## 2022-01-31 ENCOUNTER — Telehealth: Payer: Self-pay

## 2022-01-31 NOTE — Telephone Encounter (Signed)
INR result for 01/30/2022 is 1.6.  ? ?LMTCB. Need to find out how she is taking her Coumadin, if she missed any doses, accidentally took extra.  ?

## 2022-02-06 ENCOUNTER — Other Ambulatory Visit: Payer: Self-pay | Admitting: Internal Medicine

## 2022-02-07 ENCOUNTER — Encounter: Payer: Self-pay | Admitting: Internal Medicine

## 2022-02-08 ENCOUNTER — Other Ambulatory Visit: Payer: Self-pay

## 2022-02-08 LAB — PROTIME-INR: INR: 1.6 — AB (ref 0.80–1.20)

## 2022-02-08 LAB — POCT INR: INR: 2.1 — AB (ref 0.80–1.20)

## 2022-02-08 MED ORDER — WARFARIN SODIUM 7.5 MG PO TABS: 7.5000 mg | ORAL_TABLET | Freq: Every day | ORAL | 1 refills | Status: DC

## 2022-02-14 ENCOUNTER — Telehealth: Payer: Self-pay

## 2022-02-14 NOTE — Telephone Encounter (Signed)
INR result for 02/08/2022 is 2.1. Pt is currently taking 7.5 mg daily.   Abstracted.

## 2022-02-22 LAB — POCT INR: INR: 2.4 — AB (ref 0.80–1.20)

## 2022-02-27 ENCOUNTER — Telehealth: Payer: Self-pay

## 2022-02-27 NOTE — Telephone Encounter (Signed)
INR result for 02/22/2022 is 2.4. Pt is currently taking 7.5 mg daily.   Abstracted.

## 2022-02-27 NOTE — Telephone Encounter (Signed)
Notified through mychart. 

## 2022-03-04 ENCOUNTER — Other Ambulatory Visit: Payer: Self-pay | Admitting: Internal Medicine

## 2022-03-05 LAB — POCT INR: INR: 2.9 — AB (ref 0.80–1.20)

## 2022-03-07 ENCOUNTER — Telehealth: Payer: Self-pay

## 2022-03-07 NOTE — Telephone Encounter (Signed)
INR result for 03/05/2022 is 2.9. Pt is currently taking 7.5 mg daily.   Abstracted.

## 2022-03-07 NOTE — Telephone Encounter (Signed)
Sent via mychart

## 2022-03-11 LAB — POCT INR: INR: 2.1 — AB (ref 0.80–1.20)

## 2022-03-22 LAB — POCT INR: INR: 2.7 — AB (ref 0.80–1.20)

## 2022-03-23 ENCOUNTER — Encounter: Payer: 59 | Admitting: Internal Medicine

## 2022-03-23 ENCOUNTER — Encounter: Payer: Self-pay | Admitting: Internal Medicine

## 2022-03-27 ENCOUNTER — Telehealth: Payer: Self-pay

## 2022-03-27 NOTE — Telephone Encounter (Signed)
INR result for 03/22/2022 was 2.7. Pt is currently taking 7.5 mg daily.   Abstracted.

## 2022-03-30 ENCOUNTER — Other Ambulatory Visit: Payer: Self-pay | Admitting: Family

## 2022-04-01 NOTE — Telephone Encounter (Signed)
Error. ng 

## 2022-04-02 LAB — POCT INR: INR: 3.4 — AB (ref <=1.20)

## 2022-04-03 ENCOUNTER — Telehealth: Payer: Self-pay

## 2022-04-03 DIAGNOSIS — Z7901 Long term (current) use of anticoagulants: Secondary | ICD-10-CM

## 2022-04-03 MED ORDER — WARFARIN SODIUM 1 MG PO TABS
1.0000 mg | ORAL_TABLET | Freq: Every day | ORAL | 0 refills | Status: DC
Start: 2022-04-03 — End: 2022-06-27

## 2022-04-03 MED ORDER — WARFARIN SODIUM 6 MG PO TABS: 6.0000 mg | ORAL_TABLET | Freq: Every day | ORAL | 0 refills | Status: DC

## 2022-04-03 NOTE — Addendum Note (Signed)
Addended by: Sherlene Shams on: 04/03/2022 05:16 PM   Modules accepted: Orders

## 2022-04-03 NOTE — Telephone Encounter (Addendum)
Note amended with the correct INR result.   INR result for 04/02/2022 is 3.4. Pt is currently taking 7.5 mg daily. Spoke with pt to see if she has taken anything different or done anything different. Pt stated that the only she know that has been different is that she is taking a Aleve more often due to some neck discomfort.    Abstracted.

## 2022-04-03 NOTE — Assessment & Plan Note (Signed)
Resume 7 mg dose after 2 day suspension for INR 3.4 on 7.5 mg daily

## 2022-04-03 NOTE — Telephone Encounter (Signed)
Attempted to reach pt by phone, no answer. Sent a [employer].

## 2022-04-14 LAB — POCT INR: INR: 2.3 — AB (ref 0.80–1.20)

## 2022-04-17 ENCOUNTER — Telehealth: Payer: Self-pay

## 2022-04-17 NOTE — Telephone Encounter (Signed)
LMTCB and also sent a mychart message 

## 2022-04-17 NOTE — Telephone Encounter (Signed)
INR result for 04/14/2022 is 2.3. Pt is currently taking 7 mg daily.   Abstracted.

## 2022-04-19 ENCOUNTER — Other Ambulatory Visit (HOSPITAL_COMMUNITY)
Admission: RE | Admit: 2022-04-19 | Discharge: 2022-04-19 | Disposition: A | Discharge status: Home / Self Care | Payer: 59 | Source: Ambulatory Visit | Attending: Internal Medicine | Admitting: Internal Medicine

## 2022-04-19 ENCOUNTER — Ambulatory Visit (INDEPENDENT_AMBULATORY_CARE_PROVIDER_SITE_OTHER): Payer: 59 | Admitting: Internal Medicine

## 2022-04-19 ENCOUNTER — Encounter: Payer: Self-pay | Admitting: Internal Medicine

## 2022-04-19 VITALS — BP 116/78 | HR 79 | Temp 97.7°F | Resp 12 | Ht 59.0 in | Wt 117.2 lb

## 2022-04-19 DIAGNOSIS — Z0001 Encounter for general adult medical examination with abnormal findings: Secondary | ICD-10-CM | POA: Diagnosis not present

## 2022-04-19 DIAGNOSIS — Z124 Encounter for screening for malignant neoplasm of cervix: Secondary | ICD-10-CM | POA: Diagnosis present

## 2022-04-19 DIAGNOSIS — Z1159 Encounter for screening for other viral diseases: Secondary | ICD-10-CM

## 2022-04-19 DIAGNOSIS — M542 Cervicalgia: Secondary | ICD-10-CM | POA: Insufficient documentation

## 2022-04-19 DIAGNOSIS — Z0000 Encounter for general adult medical examination without abnormal findings: Secondary | ICD-10-CM

## 2022-04-19 DIAGNOSIS — Z79899 Other long term (current) drug therapy: Secondary | ICD-10-CM

## 2022-04-19 DIAGNOSIS — N921 Excessive and frequent menstruation with irregular cycle: Secondary | ICD-10-CM | POA: Diagnosis not present

## 2022-04-19 DIAGNOSIS — Z23 Encounter for immunization: Secondary | ICD-10-CM

## 2022-04-19 DIAGNOSIS — Z1211 Encounter for screening for malignant neoplasm of colon: Secondary | ICD-10-CM

## 2022-04-19 DIAGNOSIS — E782 Mixed hyperlipidemia: Secondary | ICD-10-CM | POA: Diagnosis not present

## 2022-04-19 NOTE — Patient Instructions (Addendum)
Your annual mammogram has been ordered.  Sabrina Woodard will not allow [place] to schedule it for you,  so please  call to make your appointment [phone]     I will initiate the order for your colon cancer screening  Test, the one called  Cologuard.  It will be delivered to your house, and you will send off a stool sample in the envelope it provides.   Check with your insurance to make sure it is covered as a screening test

## 2022-04-19 NOTE — Assessment & Plan Note (Signed)

## 2022-04-19 NOTE — Progress Notes (Signed)
The patient is here for annual preventive examination and management of other chronic and acute problems.   The risk factors are reflected in the social history.   The roster of all physicians providing medical care to patient - is listed in the Snapshot section of the chart.   Activities of daily living:  The patient is 100% independent in all ADLs: dressing, toileting, feeding as well as independent mobility   Home safety : The patient has smoke detectors in the home. They wear seatbelts.  There are no unsecured firearms at home. There is no violence in the home.    There is no risks for hepatitis, STDs or HIV. There is no   history of blood transfusion. They have no travel history to infectious disease endemic areas of the world.   The patient has seen their dentist in the last six month. They have seen their eye doctor in the last year. The patinet  denies slight hearing difficulty with regard to whispered voices and some television programs.  They have deferred audiologic testing in the last year.  They do not  have excessive sun exposure. Discussed the need for sun protection: hats, long sleeves and use of sunscreen if there is significant sun exposure.    Diet: the importance of a healthy diet is discussed. They do have a healthy diet.   The benefits of regular aerobic exercise were discussed. The patient  exercises  3 to 5 days per week  for  60 minutes.    Depression screen: there are no signs or vegative symptoms of depression- irritability, change in appetite, anhedonia, sadness/tearfullness.   The following portions of the patient's history were reviewed and updated as appropriate: allergies, current medications, past family history, past medical history,  past surgical history, past social history  and problem list.   Visual acuity was not assessed per patient preference since the patient has regular follow up with an  ophthalmologist. Hearing and body mass index were assessed and  reviewed.    During the course of the visit the patient was educated and counseled about appropriate screening and preventive services including : fall prevention , diabetes screening, nutrition counseling, colorectal cancer screening, and recommended immunizations.    Chief Complaint:   1) periods occurring every 21 days,  heavy for a few days, then tapers off.  Cramping mild.    2) right sided neck pain radiates to right 5th finger occasionally /intermittently after working out . Uses aleve prn    Review of Symptoms  Patient denies headache, fevers, malaise, unintentional weight loss, skin rash, eye pain, sinus congestion and sinus pain, sore throat, dysphagia,  hemoptysis , cough, dyspnea, wheezing, chest pain, palpitations, orthopnea, edema, abdominal pain, nausea, melena, diarrhea, constipation, flank pain, dysuria, hematuria, urinary  Frequency, nocturia, numbness, tingling, seizures,  Focal weakness, Loss of consciousness,  Tremor, insomnia, depression, anxiety, and suicidal ideation.    Physical Exam:  BP 116/78 (BP Location: Left Arm, Patient Position: Sitting, Cuff Size: Normal)   Pulse 79   Temp 97.7 F (36.5 C) (Oral)   Resp 12   Ht 4\' 11"  (1.499 m)   Wt 117 lb 3.2 oz (53.2 kg)   SpO2 98%   BMI 23.67 kg/m    General Appearance:    Alert, cooperative, no distress, appears stated age  Head:    Normocephalic, without obvious abnormality, atraumatic  Eyes:    PERRL, conjunctiva/corneas clear, EOM's intact, fundi    benign, both eyes  Ears:  Normal TM's and external ear canals, both ears  Nose:   Nares normal, septum midline, mucosa normal, no drainage    or sinus tenderness  Throat:   Lips, mucosa, and tongue normal; teeth and gums normal  Neck:   Supple, symmetrical, trachea midline, no adenopathy;    thyroid:  no enlargement/tenderness/nodules; no carotid   bruit or JVD  Back:     Symmetric, no curvature, ROM normal, no CVA tenderness  Lungs:     Clear to  auscultation bilaterally, respirations unlabored  Chest Wall:    No tenderness or deformity   Heart:    Regular rate and rhythm, S1 and S2 normal, no murmur, rub   or gallop  Breast Exam:    No tenderness, masses, or nipple abnormality  Abdomen:     Soft, non-tender, bowel sounds active all four quadrants,    no masses, no organomegaly  Genitalia:    Pelvic: cervix normal in appearance, external genitalia normal, no adnexal masses or tenderness, no cervical motion tenderness, rectovaginal septum normal, uterus normal size, shape, and consistency and vagina normal without discharge  Extremities:   Extremities normal, atraumatic, no cyanosis or edema  Pulses:   2+ and symmetric all extremities  Skin:   Skin color, texture, turgor normal, no rashes or lesions  Lymph nodes:   Cervical, supraclavicular, and axillary nodes normal  Neurologic:   CNII-XII intact, normal strength, sensation and reflexes    throughout     Assessment and Plan:  Encounter for preventive health examination age appropriate education and counseling updated, referrals for preventative services and immunizations addressed, dietary and smoking counseling addressed, most recent labs reviewed.  I have personally reviewed and have noted:   1) the patient's medical and social history 2) The pt's use of alcohol, tobacco, and illicit drugs 3) The patient's current medications and supplements 4) Functional ability including ADL's, fall risk, home safety risk, hearing and visual impairment 5) Diet and physical activities 6) Evidence for depression or mood disorder 7) The patient's height, weight, and BMI have been recorded in the chart  I have made referrals, and provided counseling and education based on review of the above  Neck pain on left side Symptoms are intermittent and brought on by workouts.  Strength and DTRS are normal.    Updated Medication List Outpatient Encounter Medications as of 04/19/2022  Medication Sig    ondansetron (ZOFRAN ODT) 4 MG disintegrating tablet Take 1 tablet (4 mg total) by mouth every 8 (eight) hours as needed for nausea or vomiting.   warfarin (COUMADIN) 1 MG tablet Take 1 tablet (1 mg total) by mouth daily. With 6 mg tablet   warfarin (COUMADIN) 6 MG tablet Take 1 tablet (6 mg total) by mouth daily. With 1 mg tablet   No facility-administered encounter medications on file as of 04/19/2022.

## 2022-04-19 NOTE — Assessment & Plan Note (Signed)
Symptoms are intermittent and brought on by workouts.  Strength and DTRS are normal.

## 2022-04-20 LAB — COMPREHENSIVE METABOLIC PANEL
ALT: 15 U/L (ref 0–35)
AST: 24 U/L (ref 0–37)
Albumin: 4.2 g/dL (ref 3.5–5.2)
Alkaline Phosphatase: 65 U/L (ref 39–117)
BUN: 9 mg/dL (ref 6–23)
CO2: 24 mEq/L (ref 19–32)
Calcium: 8.7 mg/dL (ref 8.4–10.5)
Chloride: 104 mEq/L (ref 96–112)
Creatinine, Ser: 0.91 mg/dL (ref 0.40–1.20)
GFR: 75.2 mL/min (ref >60.00)
Glucose, Bld: 83 mg/dL (ref 70–99)
Potassium: 3.8 mEq/L (ref 3.5–5.1)
Sodium: 140 mEq/L (ref 135–145)
Total Bilirubin: 0.5 mg/dL (ref 0.2–1.2)
Total Protein: 6.5 g/dL (ref 6.0–8.3)

## 2022-04-20 LAB — LDL CHOLESTEROL, DIRECT: Direct LDL: 143 mg/dL

## 2022-04-20 LAB — CBC WITH DIFFERENTIAL/PLATELET
Basophils Absolute: 0.1 10*3/uL (ref 0.0–0.1)
Basophils Relative: 0.7 % (ref 0.0–3.0)
Eosinophils Absolute: 0.1 10*3/uL (ref 0.0–0.7)
Eosinophils Relative: 0.7 % (ref 0.0–5.0)
HCT: 42.8 % (ref 36.0–46.0)
Hemoglobin: 14.6 g/dL (ref 12.0–15.0)
Lymphocytes Relative: 14.3 % (ref 12.0–46.0)
Lymphs Abs: 1.1 10*3/uL (ref 0.7–4.0)
MCHC: 34.1 g/dL (ref 30.0–36.0)
MCV: 90.7 fl (ref 78.0–100.0)
Monocytes Absolute: 0.4 10*3/uL (ref 0.1–1.0)
Monocytes Relative: 5 % (ref 3.0–12.0)
Neutro Abs: 6.3 10*3/uL (ref 1.4–7.7)
Neutrophils Relative %: 79.3 % — ABNORMAL HIGH (ref 43.0–77.0)
Platelets: 174 10*3/uL (ref 150.0–400.0)
RBC: 4.72 Mil/uL (ref 3.87–5.11)
RDW: 13.3 % (ref 11.5–15.5)
WBC: 8 10*3/uL (ref 4.0–10.5)

## 2022-04-20 LAB — LIPID PANEL
Cholesterol: 218 mg/dL — ABNORMAL HIGH (ref 0–200)
HDL: 56.3 mg/dL (ref >39.00)
LDL Cholesterol: 144 mg/dL — ABNORMAL HIGH (ref 0–99)
NonHDL: 161.91
Total CHOL/HDL Ratio: 4
Triglycerides: 90 mg/dL (ref 0.0–149.0)
VLDL: 18 mg/dL (ref 0.0–40.0)

## 2022-04-20 LAB — HEPATITIS C ANTIBODY: Hepatitis C Ab: NONREACTIVE

## 2022-04-20 LAB — TSH: TSH: 1.43 u[IU]/mL (ref 0.35–5.50)

## 2022-04-24 LAB — CYTOLOGY - PAP
Comment: NEGATIVE
Diagnosis: NEGATIVE
Diagnosis: REACTIVE
High risk HPV: NEGATIVE

## 2022-04-26 LAB — POCT INR: INR: 2.3 — AB (ref <=1.20)

## 2022-04-28 ENCOUNTER — Telehealth: Payer: Self-pay

## 2022-04-28 NOTE — Telephone Encounter (Signed)
INR ok.  Continue current coumadin dose and recheck pt/inr in one week

## 2022-04-28 NOTE — Telephone Encounter (Signed)
I called and spoke with patient. She was advised that results were okay & therapeutic. She will recheck on one week's time.

## 2022-04-28 NOTE — Telephone Encounter (Signed)
Patient current MD INR was 2.3 04/26/22  Current dose is 7 mg of Coumadin daily.

## 2022-05-12 LAB — COLOGUARD: COLOGUARD: NEGATIVE

## 2022-05-13 LAB — POCT INR: INR: 2.3 — AB (ref 0.80–1.20)

## 2022-05-17 ENCOUNTER — Telehealth: Payer: Self-pay

## 2022-05-17 NOTE — Telephone Encounter (Signed)
INR result for 05/13/2022 was 2.3. pt is currently taking 7 mg daily.   Abstracted.

## 2022-05-25 LAB — PROTIME-INR: INR: 2.1 — AB (ref 0.80–1.20)

## 2022-05-25 LAB — POCT INR: INR: 2.1 — AB (ref <=1.20)

## 2022-05-29 ENCOUNTER — Ambulatory Visit
Admission: RE | Admit: 2022-05-29 | Discharge: 2022-05-29 | Disposition: A | Discharge status: Home / Self Care | Payer: 59 | Source: Ambulatory Visit | Attending: Internal Medicine | Admitting: Internal Medicine

## 2022-05-29 DIAGNOSIS — Z1231 Encounter for screening mammogram for malignant neoplasm of breast: Secondary | ICD-10-CM | POA: Insufficient documentation

## 2022-05-30 ENCOUNTER — Telehealth: Payer: Self-pay

## 2022-05-30 NOTE — Telephone Encounter (Signed)
INR result for 05/25/2022 was 2.1. Pt is currently taking 7 mg daily.   Abstracted.

## 2022-06-05 ENCOUNTER — Encounter: Payer: Self-pay | Admitting: Internal Medicine

## 2022-06-05 ENCOUNTER — Telehealth: Payer: Self-pay

## 2022-06-05 ENCOUNTER — Ambulatory Visit
Admission: EM | Admit: 2022-06-05 | Discharge: 2022-06-05 | Disposition: A | Discharge status: Home / Self Care | Payer: 59 | Attending: Emergency Medicine | Admitting: Emergency Medicine

## 2022-06-05 DIAGNOSIS — N390 Urinary tract infection, site not specified: Secondary | ICD-10-CM | POA: Diagnosis present

## 2022-06-05 LAB — POCT URINALYSIS DIP (MANUAL ENTRY)
Bilirubin, UA: NEGATIVE
Glucose, UA: NEGATIVE mg/dL
Ketones, POC UA: NEGATIVE mg/dL
Nitrite, UA: POSITIVE — AB
Protein Ur, POC: 100 mg/dL — AB
Spec Grav, UA: 1.02 (ref 1.010–1.025)
Urobilinogen, UA: 1 E.U./dL
pH, UA: 7.5 (ref 5.0–8.0)

## 2022-06-05 LAB — POCT URINE PREGNANCY: Preg Test, Ur: NEGATIVE

## 2022-06-05 MED ORDER — NITROFURANTOIN MONOHYD MACRO 100 MG PO CAPS: 100.0000 mg | ORAL_CAPSULE | Freq: Two times a day (BID) | ORAL | 0 refills | Status: DC

## 2022-06-05 NOTE — ED Provider Notes (Signed)
Sabrina Woodard    CSN: [id number] Arrival date & time: 06/05/22  1737      History   Chief Complaint Chief Complaint  Patient presents with   Dysuria    HPI Sabrina Woodard is a 46 y.o. female.  Patient presents with dysuria, urinary urgency, low back pain x 5 days.  No fever, rash, abdominal pain, vaginal discharge, pelvic pain, nausea, vomiting, diarrhea, or other symptoms.  No treatments at home.  Her medical history includes kidney stones, DVT, pulmonary embolism.  She is on Coumadin.  The history is provided by the patient and medical records.    Past Medical History:  Diagnosis Date   Anxiety    Chronic kidney disease 2014   kidney stones   Clotting disorder (HCC) 1995   Protien C deficcency   DVT (deep venous thrombosis) (HCC)    History of kidney stones    Pneumothorax    Ureteral stone with hydronephrosis 05/04/2017    Patient Active Problem List   Diagnosis Date Noted   Neck pain on left side 04/19/2022   Anticoagulation monitoring, INR range 2-3 12/17/2018   Leg DVT (deep venous thromboembolism), acute, left (HCC) 09/29/2018   Osteopenia of spine 12/24/2016   Encounter for screening for cervical cancer 05/23/2015   Encounter for preventive health examination 04/25/2015   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      Home Medications    Prior to Admission medications   Medication Sig Start Date End Date Taking? Authorizing Provider   nitrofurantoin, macrocrystal-monohydrate, (MACROBID) 100 MG capsule Take 1 capsule (100 mg total) by mouth 2 (two) times daily. 06/05/22   Mickie Bail, NP  ondansetron (ZOFRAN ODT) 4 MG disintegrating tablet Take 1 tablet (4 mg total) by mouth every 8 (eight) hours as needed for nausea or vomiting. 09/27/18   Sherlene Shams, MD  warfarin (COUMADIN) 1 MG tablet Take 1 tablet (1 mg total) by mouth daily. With 6 mg tablet 04/03/22   Sherlene Shams, MD  warfarin (COUMADIN) 6 MG tablet Take 1 tablet (6 mg total) by mouth daily. With 1 mg tablet 04/03/22   Sherlene Shams, MD    Family History Family History  Problem Relation Age of Onset   Hyperlipidemia Mother    Hypertension Mother    Hyperlipidemia Father    Hypertension Father    Prostate cancer Father 50       prostate    Stroke Maternal Grandfather    Cancer Paternal Grandmother 104       breast ca    Breast cancer Paternal Grandmother    COPD Paternal Grandfather    Kidney cancer Neg Hx    Bladder Cancer Neg Hx     Social History Social History   Tobacco Use   Smoking status: Never   Smokeless tobacco: Never  Vaping Use   Vaping Use: Never used  Substance Use Topics   Alcohol use: No   Drug use: No     Allergies   Penicillins and Sulfa antibiotics  Review of Systems Review of Systems  Constitutional:  Negative for chills and fever.  Gastrointestinal:  Negative for abdominal pain, diarrhea, nausea and vomiting.  Genitourinary:  Positive for dysuria and urgency. Negative for flank pain, hematuria, pelvic pain and vaginal discharge.  Musculoskeletal:  Positive for back pain.  Skin:  Negative for color change and rash.  All other systems reviewed and are negative.    Physical Exam Triage Vital Signs ED Triage Vitals  Enc Vitals Group     BP 06/05/22 1758 (!) 146/83     Pulse Rate 06/05/22 1758 90     Resp 06/05/22 1758 18     Temp 06/05/22 1758 98.4 F (36.9 C)     Temp src --      SpO2 06/05/22 1758 99  %     Weight 06/05/22 1813 117 lb (53.1 kg)     Height 06/05/22 1813 4\' 11"  (1.499 m)     Head Circumference --      Peak Flow --      Pain Score 06/05/22 1813 3     Pain Loc --      Pain Edu? --      Excl. in GC? --    No data found.  Updated Vital Signs BP (!) 146/83   Pulse 90   Temp 98.4 F (36.9 C)   Resp 18   Ht 4\' 11"  (1.499 m)   Wt 117 lb (53.1 kg)   LMP 05/31/2022   SpO2 99%   BMI 23.63 kg/m   Visual Acuity Right Eye Distance:   Left Eye Distance:   Bilateral Distance:    Right Eye Near:   Left Eye Near:    Bilateral Near:     Physical Exam Vitals and nursing note reviewed.  Constitutional:      General: She is not in acute distress.    Appearance: Normal appearance. She is well-developed. She is not ill-appearing.  HENT:     Mouth/Throat:     Mouth: Mucous membranes are moist.  Cardiovascular:     Rate and Rhythm: Normal rate and regular rhythm.     Heart sounds: Normal heart sounds.  Pulmonary:     Effort: Pulmonary effort is normal. No respiratory distress.     Breath sounds: Normal breath sounds.  Abdominal:     General: Bowel sounds are normal.     Palpations: Abdomen is soft.     Tenderness: There is no abdominal tenderness. There is no right CVA tenderness, left CVA tenderness, guarding or rebound.  Musculoskeletal:     Cervical back: Neck supple.  Skin:    General: Skin is warm and dry.  Neurological:     Mental Status: She is alert.  Psychiatric:        Mood and Affect: Mood normal.        Behavior: Behavior normal.      UC Treatments / Results  Labs (all labs ordered are listed, but only abnormal results are displayed) Labs Reviewed  POCT URINALYSIS DIP (MANUAL ENTRY) - Abnormal; Notable for the following components:      Result Value   Clarity, UA cloudy (*)    Blood, UA small (*)    Protein Ur, POC =100 (*)    Nitrite, UA Positive (*)    Leukocytes, UA Moderate (2+) (*)    All other components within normal limits   URINE CULTURE  POCT URINE PREGNANCY    EKG   Radiology No results found.  Procedures Procedures (including critical care time)  Medications Ordered in UC Medications - No data to display  Initial Impression / Assessment and Plan / UC Course  I have reviewed the triage vital signs and the nursing notes.  Pertinent labs & imaging results that were available during my care of the patient were reviewed by me and considered in my medical decision making (see chart for details).   UTI.  Treating with Macrobid (patient is anaphylactic reaction to penicillin and is also allergic to sulfa). Urine culture pending. Discussed with patient that we will call her if the urine culture shows the need to change or discontinue the antibiotic.  Patient is on Coumadin; instructed patient to monitor her PT/INR daily while on Macrobid.  Instructed her to follow-up with her PCP if her symptoms are not improving.  She agrees to plan of care.      Final Clinical Impressions(s) / UC Diagnoses   Final diagnoses:  Urinary tract infection without hematuria, site unspecified     Discharge Instructions      Take the antibiotic as directed.  The urine culture is pending.  We will call you if it shows the need to change or discontinue your antibiotic.    Follow up with your primary care provider if your symptoms are not improving.        ED Prescriptions     Medication Sig Dispense Auth. Provider   nitrofurantoin, macrocrystal-monohydrate, (MACROBID) 100 MG capsule  (Status: Discontinued) Take 1 capsule (100 mg total) by mouth 2 (two) times daily. 10 capsule Sharion Balloon, NP   nitrofurantoin, macrocrystal-monohydrate, (MACROBID) 100 MG capsule Take 1 capsule (100 mg total) by mouth 2 (two) times daily. 10 capsule Sharion Balloon, NP      PDMP not reviewed this encounter.   Sharion Balloon, NP 06/05/22 [phone]

## 2022-06-05 NOTE — Discharge Instructions (Addendum)
Take the antibiotic as directed.  The urine culture is pending.  We will call you if it shows the need to change or discontinue your antibiotic.    Follow up with your primary care provider if your symptoms are not improving.    

## 2022-06-05 NOTE — ED Triage Notes (Signed)
Patient to Urgent Care with complaints of urinary urgency and right lower back pain. Reports symptoms started 9/13. Reports feeling as though she isn't fully emptying he bladder.

## 2022-06-05 NOTE — Telephone Encounter (Signed)
Patient states she thinks she may have a UTI.  Patient states she has been having symptoms  since 06/01/22, frequency, urgency, feels like she hasn't been emptying her bladder, and she states she has a strong smell to her urine.  I offered patient an appointment tomorrow with a provider at [facility] or with one of our providers on Wednesday (06/07/2022), but patient states she will just go to a walk-in clinic.  Patient declined offer for providers in [place].

## 2022-06-06 ENCOUNTER — Telehealth: Payer: Self-pay

## 2022-06-06 NOTE — Telephone Encounter (Signed)
Spoke with pt and informed her that she will need to skip tomorrow's dose and recheck her INR on Friday. Pt gave a verbal understanding.

## 2022-06-06 NOTE — Telephone Encounter (Signed)
Brianna called from [facility] to report patient has an out-of-range INR.  Denton Ar states this information has been faxed to [place].  Denton Ar states the INR was taken on 06/05/2022 at a 1.8.

## 2022-06-06 NOTE — Telephone Encounter (Signed)
Pt went to UC. See mychart message about visit to UC and INR result. Pt was prescribed antibiotics.

## 2022-06-06 NOTE — Telephone Encounter (Signed)
See previous message and mychart message.

## 2022-06-07 LAB — URINE CULTURE: Culture: >=100000 — AB

## 2022-06-11 LAB — PROTIME-INR

## 2022-06-13 ENCOUNTER — Telehealth: Payer: Self-pay

## 2022-06-13 NOTE — Telephone Encounter (Signed)
error 

## 2022-06-13 NOTE — Telephone Encounter (Signed)
INR result for 06/11/2022 is 2.1. Pt is currently taking 7 mg daily.   Abstracted.

## 2022-06-14 ENCOUNTER — Telehealth: Payer: Self-pay | Admitting: Internal Medicine

## 2022-06-14 ENCOUNTER — Ambulatory Visit: Payer: 59 | Admitting: Internal Medicine

## 2022-06-14 NOTE — Telephone Encounter (Signed)
Pt would like to be called regarding medication that she was on. Pt stated after a couple days of being on it the symptoms came back

## 2022-06-14 NOTE — Telephone Encounter (Signed)
Spoke with pt and she stated that since she finished her antibiotics for the UTI her symptoms have returned. I have scheduled pt for an appt with Dr. Olivia Mackie in the morning at 8am.

## 2022-06-15 ENCOUNTER — Encounter: Payer: Self-pay | Admitting: Internal Medicine

## 2022-06-15 ENCOUNTER — Ambulatory Visit (INDEPENDENT_AMBULATORY_CARE_PROVIDER_SITE_OTHER): Payer: 59 | Admitting: Internal Medicine

## 2022-06-15 VITALS — BP 123/80 | HR 68 | Temp 97.8°F | Ht 59.0 in | Wt 118.0 lb

## 2022-06-15 DIAGNOSIS — N3000 Acute cystitis without hematuria: Secondary | ICD-10-CM | POA: Diagnosis not present

## 2022-06-15 DIAGNOSIS — B379 Candidiasis, unspecified: Secondary | ICD-10-CM

## 2022-06-15 DIAGNOSIS — N200 Calculus of kidney: Secondary | ICD-10-CM | POA: Diagnosis not present

## 2022-06-15 MED ORDER — FLUCONAZOLE 150 MG PO TABS: 150.0000 mg | ORAL_TABLET | Freq: Once | ORAL | 0 refills | Status: AC

## 2022-06-15 MED ORDER — CIPROFLOXACIN HCL 500 MG PO TABS: 500.0000 mg | ORAL_TABLET | Freq: Two times a day (BID) | ORAL | 0 refills | Status: AC

## 2022-06-15 NOTE — Progress Notes (Signed)
Chief Complaint  Patient presents with   Urinary Tract Infection    Pt stated on the 18th she went to the walk in clinic they gave her abx for her uti she finished them but she feels as if its not gone she said her sx's have improved but not gone    F/u  1. H/o UTI/right kidney stones noted in 2018 s/p lithotripsy. She is having trouble emptying bladder and lower ab pain and burning and right CVA flank pain again. She was txed uti 06/05/22 with macrobid but helped and then sxs back.  She is on coumadin for protein C def and h/o DVT and will check INR weekly     Review of Systems  Constitutional:  Negative for weight loss.  HENT:  Negative for hearing loss.   Eyes:  Negative for blurred vision.  Respiratory:  Negative for shortness of breath.   Cardiovascular:  Negative for chest pain.  Gastrointestinal:  Positive for abdominal pain. Negative for blood in stool.  Genitourinary:  Positive for dysuria and urgency.  Musculoskeletal:  Negative for falls and joint pain.  Skin:  Negative for rash.  Neurological:  Negative for headaches.  Psychiatric/Behavioral:  Negative for depression.    Past Medical History:  Diagnosis Date   Anxiety    Chronic kidney disease 2014   kidney stones   Clotting disorder (HCC) 1995   Protien C deficcency   DVT (deep venous thrombosis) (HCC)    History of kidney stones    Pneumothorax    Protein C deficiency (HCC)    Ureteral stone with hydronephrosis 05/04/2017   Past Surgical History:  Procedure Laterality Date   CYSTOSCOPY/URETEROSCOPY/HOLMIUM LASER/STENT PLACEMENT Right 05/15/2017   Procedure: CYSTOSCOPY/URETEROSCOPY/HOLMIUM LASER/STENT PLACEMENT;  Surgeon: [name], MD;  Location: [facility];  Service: Urology;  Laterality: Right;   TONSILLECTOMY AND ADENOIDECTOMY Bilateral 1995   Family History  Problem Relation Age of Onset   Hyperlipidemia Mother    Hypertension Mother    Protein C deficiency Mother    Protein S deficiency Mother     Hyperlipidemia Father    Hypertension Father    Prostate cancer Father 33       prostate    Stroke Maternal Grandfather    Cancer Paternal Grandmother 46       breast ca    Breast cancer Paternal Grandmother    COPD Paternal Grandfather    Protein C deficiency Daughter    Kidney cancer Neg Hx    Bladder Cancer Neg Hx    Social History   Socioeconomic History   Marital status: Married    Spouse name: Not on file   Number of children: Not on file   Years of education: Not on file   Highest education level: Not on file  Occupational History   Not on file  Tobacco Use   Smoking status: Never   Smokeless tobacco: Never  Vaping Use   Vaping Use: Never used  Substance and Sexual Activity   Alcohol use: No   Drug use: No   Sexual activity: Yes    Birth control/protection: Other-see comments    Comment: husband has had a vasectomy  Other Topics Concern   Not on file  Social History Narrative   Not on file   Social Determinants of Health   Financial Resource Strain: Not on file  Food Insecurity: Not on file  Transportation Needs: Not on file  Physical Activity: Not on file  Stress: Not on file  Social Connections: Not on file  Intimate Partner Violence: Not on file   Current Meds  Medication Sig   ciprofloxacin (CIPRO) 500 MG tablet Take 1 tablet (500 mg total) by mouth 2 (two) times daily for 5 days. With food   fluconazole (DIFLUCAN) 150 MG tablet Take 1 tablet (150 mg total) by mouth once for 1 dose. Repeat dose in 3 days if needed   ondansetron (ZOFRAN ODT) 4 MG disintegrating tablet Take 1 tablet (4 mg total) by mouth every 8 (eight) hours as needed for nausea or vomiting.   warfarin (COUMADIN) 1 MG tablet Take 1 tablet (1 mg total) by mouth daily. With 6 mg tablet   warfarin (COUMADIN) 6 MG tablet Take 1 tablet (6 mg total) by mouth daily. With 1 mg tablet   Allergies  Allergen Reactions   Penicillins Anaphylaxis    Has patient had a PCN reaction causing  immediate rash, facial/tongue/throat swelling, SOB or lightheadedness with hypotension: Yes Has patient had a PCN reaction causing severe rash involving mucus membranes or skin necrosis: No Has patient had a PCN reaction that required hospitalization: No Has patient had a PCN reaction occurring within the last 10 years: Yes If all of the above answers are "NO", then may proceed with Cephalosporin use.    Sulfa Antibiotics Rash   Recent Results (from the past 2160 hour(s))  POCT INR     Status: Abnormal   Collection Time: 03/22/22 12:00 AM  Result Value Ref Range   INR 2.70 (A) 0.80 - 1.20  POCT INR     Status: Abnormal   Collection Time: 04/02/22 12:00 AM  Result Value Ref Range   INR 3.40 (A) 0.80 - 1.20  POCT INR     Status: Abnormal   Collection Time: 04/14/22 12:00 AM  Result Value Ref Range   INR 2.30 (A) 0.80 - 1.20  Cytology - PAP( [place])     Status: None   Collection Time: 04/19/22  3:17 PM  Result Value Ref Range   High risk HPV Negative    Adequacy      Satisfactory for evaluation; transformation zone component PRESENT.   Diagnosis      - Negative for Intraepithelial Lesions or Malignancy (NILM)   Diagnosis - Benign reactive/reparative changes    Comment Normal Reference Range HPV - Negative   Hepatitis C Antibody     Status: None   Collection Time: 04/19/22  3:26 PM  Result Value Ref Range   Hepatitis C Ab NON-REACTIVE NON-REACTIVE    Comment: . HCV antibody was non-reactive. There is no laboratory  evidence of HCV infection. . In most cases, no further action is required. However, if recent HCV exposure is suspected, a test for HCV RNA (test code [id number]) is suggested. . For additional information please refer to http://education.[facility].com/faq/FAQ22v1 (This link is being provided for informational/ educational purposes only.) .   TSH     Status: None   Collection Time: 04/19/22  3:26 PM  Result Value Ref Range   TSH 1.43 0.35 - 5.50  uIU/mL  Comprehensive metabolic panel     Status: None   Collection Time: 04/19/22  3:26 PM  Result Value Ref Range   Sodium 140 135 - 145 mEq/L   Potassium 3.8 3.5 - 5.1 mEq/L   Chloride 104 96 - 112 mEq/L   CO2 24 19 - 32 mEq/L   Glucose, Bld 83 70 - 99 mg/dL   BUN 9 6 - 23 mg/dL  Creatinine, Ser 0.91 0.40 - 1.20 mg/dL   Total Bilirubin 0.5 0.2 - 1.2 mg/dL   Alkaline Phosphatase 65 39 - 117 U/L   AST 24 0 - 37 U/L   ALT 15 0 - 35 U/L   Total Protein 6.5 6.0 - 8.3 g/dL   Albumin 4.2 3.5 - 5.2 g/dL   GFR 90.24 >09.73 mL/min    Comment: Calculated using the CKD-EPI Creatinine Equation (2021)   Calcium 8.7 8.4 - 10.5 mg/dL  CBC with Differential/Platelet     Status: Abnormal   Collection Time: 04/19/22  3:26 PM  Result Value Ref Range   WBC 8.0 4.0 - 10.5 K/uL   RBC 4.72 3.87 - 5.11 Mil/uL   Hemoglobin 14.6 12.0 - 15.0 g/dL   HCT 53.2 99.2 - 42.6 %   MCV 90.7 78.0 - 100.0 fl   MCHC 34.1 30.0 - 36.0 g/dL   RDW 83.4 19.6 - 22.2 %   Platelets 174.0 150.0 - 400.0 K/uL   Neutrophils Relative % 79.3 (H) 43.0 - 77.0 %   Lymphocytes Relative 14.3 12.0 - 46.0 %   Monocytes Relative 5.0 3.0 - 12.0 %   Eosinophils Relative 0.7 0.0 - 5.0 %   Basophils Relative 0.7 0.0 - 3.0 %   Neutro Abs 6.3 1.4 - 7.7 K/uL   Lymphs Abs 1.1 0.7 - 4.0 K/uL   Monocytes Absolute 0.4 0.1 - 1.0 K/uL   Eosinophils Absolute 0.1 0.0 - 0.7 K/uL   Basophils Absolute 0.1 0.0 - 0.1 K/uL  Lipid panel     Status: Abnormal   Collection Time: 04/19/22  3:26 PM  Result Value Ref Range   Cholesterol 218 (H) 0 - 200 mg/dL    Comment: ATP III Classification       Desirable:  < 200 mg/dL               Borderline High:  200 - 239 mg/dL          High:  > = 979 mg/dL   Triglycerides 89.2 0.0 - 149.0 mg/dL    Comment: Normal:  <119 mg/dLBorderline High:  150 - 199 mg/dL   HDL 41.74 >08.14 mg/dL   VLDL 48.1 0.0 - 85.6 mg/dL   LDL Cholesterol 314 (H) 0 - 99 mg/dL   Total CHOL/HDL Ratio 4     Comment:                Men           Women1/2 Average Risk     3.4          3.3Average Risk          5.0          4.42X Average Risk          9.6          7.13X Average Risk          15.0          11.0                       NonHDL 161.91     Comment: NOTE:  Non-HDL goal should be 30 mg/dL higher than patient's LDL goal (i.e. LDL goal of < 70 mg/dL, would have non-HDL goal of < 100 mg/dL)  Direct LDL     Status: None   Collection Time: 04/19/22  3:26 PM  Result Value Ref Range   Direct LDL 143.0 mg/dL  Comment: Optimal:  <100 mg/dLNear or Above Optimal:  100-129 mg/dLBorderline High:  130-159 mg/dLHigh:  160-189 mg/dLVery High:  >190 mg/dL  POCT INR     Status: Abnormal   Collection Time: 04/26/22 12:00 AM  Result Value Ref Range   INR 2.30 (A) 0.80 - 1.20  Cologuard     Status: None   Collection Time: 05/06/22  8:15 AM  Result Value Ref Range   COLOGUARD Negative Negative    Comment:  NEGATIVE TEST RESULT. A negative Cologuard result indicates a low likelihood that a colorectal cancer (CRC) or advanced adenoma (adenomatous polyps with more advanced pre-malignant features)  is present. The chance that a person with a negative Cologuard test has a colorectal cancer is less than 1 in 1500 (negative predictive value >99.9%) or has an  advanced adenoma is less than  5.3% (negative predictive value 94.7%). These data are based on a prospective cross-sectional study of 10,000 individuals at average risk for colorectal cancer who were screened with both Cologuard and colonoscopy. (Imperiale T. et al, N Engl J Med 2014;370(14):1286-1297) The normal value (reference range) for this assay is negative.  COLOGUARD RE-SCREENING RECOMMENDATION: Periodic colorectal cancer screening is an important part of preventive healthcare for asymptomatic individuals at average risk for colorectal cancer.  Following a negative Cologuard result, the [place] Task Force screening guidelines recommend a Cologuard  re-screening interval of 3 years.  References: American Cancer Society Guideline for Colorectal Cancer Screening: https://www.cancer.org/cancer/colon-rectal-cancer/detection-diagnosis-staging/acs-recommendations.html.; Rex DK, Boland CR, Dominitz JK, Colorectal Cancer Screening: Recommendations for Physicians and Patients from the [place] Task Force on Colorectal Cancer Screening , Am J Gastroenterology 2017; 254:2706-2376.  TEST DESCRIPTION: Composite algorithmic analysis of stool DNA-biomarkers with hemoglobin immunoassay.   Quantitative values of individual biomarkers are not reportable and are not associated with individual biomarker result reference ranges. Cologuard is intended for colorectal cancer screening of adults of either sex, 41 years or older, who are at average-risk for colorectal cancer (CRC). Cologuard has been approved for use by the U.[place]. FDA. The performance of Cologuard was  established in a cross sectional study of average-risk adults aged 73-84. Cologuard performance in patients ages 60 to 71 years was estimated by sub-group analysis of near-age groups. Colonoscopies performed for a positive result may find as the most clinically significant lesion: colorectal cancer [4.0%], advanced adenoma (including sessile serrated polyps greater than or equal to 1cm diameter) [20%] or non- advanced adenoma [31%]; or no colorectal neoplasia [45%]. These estimates are derived from a prospective cross-sectional screening study of 10,000 individuals at average risk for colorectal cancer who were screened with both Cologuard and colonoscopy. (Imperiale T. et al, Alison Stalling J Med 2014;370(14):1286-1297.) Cologuard may produce a false negative or false positive result (no colorectal cancer or precancerous polyp present at colonoscopy follow up). A negative Cologuard test result does not guarantee the absence of CRC or advanced adenoma (pre-cancer). The current Cologuard  screening interval is every 3  years. Paramedic and [employer]). Cologuard performance data in a 10,000 patient pivotal study using colonoscopy as the reference method can be accessed at the following location: www.exactlabs.com/results. Additional description of the Cologuard test process, warnings and precautions can be found at www.cologuard.com.   POCT INR     Status: Abnormal   Collection Time: 05/13/22 12:00 AM  Result Value Ref Range   INR 2.30 (A) 0.80 - 1.20  POCT INR     Status: Abnormal   Collection  Time: 05/25/22 12:00 AM  Result Value Ref Range   INR 2.10 (A) 0.80 - 1.20  Protime-INR     Status: Abnormal   Collection Time: 05/25/22 12:00 AM  Result Value Ref Range   INR 2.10 (A) 0.80 - 1.20  POCT urinalysis dipstick     Status: Abnormal   Collection Time: 06/05/22  5:55 PM  Result Value Ref Range   Color, UA yellow yellow   Clarity, UA cloudy (A) clear   Glucose, UA negative negative mg/dL   Bilirubin, UA negative negative   Ketones, POC UA negative negative mg/dL   Spec Grav, UA 1.610 9.604 - 1.025   Blood, UA small (A) negative   pH, UA 7.5 5.0 - 8.0   Protein Ur, POC =100 (A) negative mg/dL   Urobilinogen, UA 1.0 0.2 or 1.0 E.U./dL   Nitrite, UA Positive (A) Negative   Leukocytes, UA Moderate (2+) (A) Negative  POCT urine pregnancy     Status: None   Collection Time: 06/05/22  5:55 PM  Result Value Ref Range   Preg Test, Ur Negative Negative  Urine Culture     Status: Abnormal   Collection Time: 06/05/22  6:59 PM   Specimen: Urine, Clean Catch  Result Value Ref Range   Specimen Description URINE, CLEAN CATCH    Special Requests      NONE Performed at [facility], [address], [place], [place] [address]    Culture >=100,000 COLONIES/mL ESCHERICHIA COLI (A)    Report Status 06/07/2022 FINAL    Organism ID, Bacteria ESCHERICHIA COLI (A)       Susceptibility   Escherichia coli - MIC*    AMPICILLIN >=32 RESISTANT Resistant     CEFAZOLIN <=4  SENSITIVE Sensitive     CEFEPIME <=0.12 SENSITIVE Sensitive     CEFTRIAXONE <=0.25 SENSITIVE Sensitive     CIPROFLOXACIN <=0.25 SENSITIVE Sensitive     GENTAMICIN <=1 SENSITIVE Sensitive     IMIPENEM <=0.25 SENSITIVE Sensitive     NITROFURANTOIN <=16 SENSITIVE Sensitive     TRIMETH/SULFA >=320 RESISTANT Resistant     AMPICILLIN/SULBACTAM 16 INTERMEDIATE Intermediate     PIP/TAZO <=4 SENSITIVE Sensitive     * >=100,000 COLONIES/mL ESCHERICHIA COLI  POCT INR     Status: Abnormal   Collection Time: 06/11/22 12:00 AM  Result Value Ref Range   INR 2.10 (A) 0.80 - 1.20   Objective  Body mass index is 23.83 kg/m. Wt Readings from Last 3 Encounters:  06/15/22 118 lb (53.5 kg)  06/05/22 117 lb (53.1 kg)  04/19/22 117 lb 3.2 oz (53.2 kg)   Temp Readings from Last 3 Encounters:  06/15/22 97.8 F (36.6 C) (Oral)  06/05/22 98.4 F (36.9 C)  04/19/22 97.7 F (36.5 C) (Oral)   BP Readings from Last 3 Encounters:  06/15/22 123/80  06/05/22 (!) 146/83  04/19/22 116/78   Pulse Readings from Last 3 Encounters:  06/15/22 68  06/05/22 90  04/19/22 79    Physical Exam Vitals and nursing note reviewed.  Constitutional:      Appearance: Normal appearance. She is well-developed and well-groomed.  HENT:     Head: Normocephalic and atraumatic.  Eyes:     Conjunctiva/sclera: Conjunctivae normal.     Pupils: Pupils are equal, round, and reactive to light.  Cardiovascular:     Rate and Rhythm: Normal rate and regular rhythm.     Heart sounds: Normal heart sounds. No murmur heard. Pulmonary:     Effort:  Pulmonary effort is normal.     Breath sounds: Normal breath sounds.  Abdominal:     General: Abdomen is flat. Bowel sounds are normal.     Tenderness: There is abdominal tenderness in the suprapubic area.  Musculoskeletal:        General: No tenderness.  Skin:    General: Skin is warm and dry.  Neurological:     General: No focal deficit present.     Mental Status: She is alert  and oriented to person, place, and time. Mental status is at baseline.     Cranial Nerves: Cranial nerves 2-12 are intact.     Motor: Motor function is intact.     Coordination: Coordination is intact.     Gait: Gait is intact.  Psychiatric:        Attention and Perception: Attention and perception normal.        Mood and Affect: Mood and affect normal.        Speech: Speech normal.        Behavior: Behavior normal. Behavior is cooperative.        Thought Content: Thought content normal.        Cognition and Memory: Cognition and memory normal.        Judgment: Judgment normal.     Assessment  Plan  Acute cystitis without hematuria with h/o right kidney stone- Plan: Urinalysis, Routine w reflex microscopic, Urine Culture, ciprofloxacin (CIPRO) 500 MG tablet bid x 5 days  Check inr frequently as least weekly on coumadin which inr maybe higher on Abx disc with pt Consider CT renal in the future   Yeast infection with Abx prn- Plan: fluconazole (DIFLUCAN) 150 MG tablet  Provider: Dr. French Ana McLean-Scocuzza-Internal Medicine

## 2022-06-15 NOTE — Patient Instructions (Addendum)
Kidney Stones  Kidney stones are solid, rock-like deposits that form inside of the kidneys. The kidneys are a pair of organs that make urine. A kidney stone may form in a kidney and move into other parts of the urinary tract, including the tubes that connect the kidneys to the bladder (ureters), the bladder, and the tube that carries urine out of the body (urethra). As the stone moves through these areas, it can cause intense pain and block the flow of urine. Kidney stones are created when high levels of certain minerals are found in the urine. The stones are usually passed out of the body through urination, but in some cases, medical treatment may be needed to remove them. What are the causes? Kidney stones may be caused by: A condition in which certain glands produce too much parathyroid hormone (primary hyperparathyroidism), which causes too much calcium buildup in the blood. A buildup of uric acid crystals in the bladder (hyperuricosuria). Uric acid is a chemical that the body produces when you eat certain foods. It usually leaves the body in the urine. Narrowing (stricture) of one or both of the ureters. A kidney blockage that is present at birth (congenital obstruction). Past surgery on the kidney or the ureters. What increases the risk? The following factors may make you more likely to develop this condition: Having had a kidney stone in the past. Having a family history of kidney stones. Not drinking enough water. Eating a diet that is high in protein, salt (sodium), or sugar. Being overweight or obese. What are the signs or symptoms? Symptoms of a kidney stone may include: Pain in the side of the abdomen, right below the ribs (flank pain). Pain usually spreads (radiates) to the groin. Needing to urinate often or urgently. Painful urination. Blood in the urine (hematuria). Nausea. Vomiting. Fever and chills. How is this diagnosed? This condition may be diagnosed based on: Your  symptoms and medical history. A physical exam. Blood tests. Urine tests. These may be done before and after the stone passes out of your body through urination. Imaging tests, such as a CT scan, abdominal X-ray, or ultrasound. A procedure to examine the inside of the bladder (cystoscopy). How is this treated? Treatment for kidney stones depends on the size, location, and makeup of the stones. Kidney stones will often pass out of the body through urination. You may need to: Increase your fluid intake to help pass the stone. In some cases, you may be given fluids through an IV and may need to be monitored in the hospital. Take medicine for pain. Make changes in your diet to help prevent kidney stones from coming back. Sometimes, procedures are needed to remove a kidney stone. This may involve: A procedure to break up kidney stones using: A focused beam of light (laser therapy). Shock waves (extracorporeal shock wave lithotripsy). Surgery to remove kidney stones. This may be needed if you have severe pain or have stones that block your urinary tract. Follow these instructions at home: Medicines Take over-the-counter and prescription medicines only as told by your health care provider. Ask your health care provider if the medicine prescribed to you requires you to avoid driving or using heavy machinery. Eating and drinking Drink enough fluid to keep your urine pale yellow. You may be instructed to drink at least 8-10 glasses of water each day. This will help you pass the kidney stone. If directed, change your diet. This may include: Limiting how much sodium you eat. Eating more fruits   and vegetables. Limiting how much animal protein you eat. Animal proteins include red meat, poultry, fish, and eggs. Eating a normal amount of calcium (1,000-1,300 mg per day). Follow instructions from your health care provider about eating or drinking restrictions. General instructions Collect urine samples as  told by your health care provider. You may need to collect a urine sample: 24 hours after you pass the stone. 8-12 weeks after you pass the kidney stone, and every 6-12 months after that. Strain your urine every time you urinate, for as long as directed. Use the strainer that your health care provider recommends. Do not throw out the kidney stone after passing it. Keep the stone so it can be tested by your health care provider. Testing the makeup of your kidney stone may help prevent you from getting kidney stones in the future. Keep all follow-up visits. You may need follow-up X-rays or ultrasounds to make sure that your stone has passed. How is this prevented? To prevent another kidney stone: Drink enough fluid to keep your urine pale yellow. This is the best way to prevent kidney stones. Eat a healthy diet. Follow recommendations from your health care provider about foods to avoid. Recommendations vary depending on the type of kidney stone that you have. You may be instructed to eat a low-protein diet. Maintain a healthy weight. Where to find more information National Kidney Foundation (NKF): www.kidney.org [facility]): www.urologyhealth.org Contact a health care provider if: You have pain that gets worse or does not get better with medicine. Get help right away if: You have a fever or chills. You develop severe pain. You develop new abdominal pain. You faint. You are unable to urinate. Summary Kidney stones are solid, rock-like deposits that form inside of the kidneys. Kidney stones can cause nausea, vomiting, blood in the urine, abdominal pain, and the urge to urinate often. Treatment for kidney stones depends on the size, location, and makeup of the stones. Kidney stones will often pass out of the body through urination. Kidney stones can be prevented by drinking enough fluids, eating a healthy diet, and maintaining a healthy weight. This information is not intended  to replace advice given to you by your health care provider. Make sure you discuss any questions you have with your health care provider. Document Revised: 12/14/2021 Document Reviewed: 12/14/2021 Elsevier Patient Education  2023 Else[employer] Inc.  Vitamin K Foods and Warfarin Warfarin is a blood thinner (anticoagulant). Anticoagulant medicines help prevent blood clots from forming or getting bigger. Warfarin works by blocking the activity of vitamin K. Vitamin K promotes normal blood clotting. When you take warfarin, problems can occur from suddenly increasing or decreasing the amount of vitamin K that you eat from one day to the next. These problems can occur due to varying levels of warfarin in your blood. Problems may include blood clots or bleeding. What are tips for eating the right amount of vitamin K? Reading food labels Know which foods contain vitamin K. Read food labels. Use the lists below to understand serving sizes and the amount of vitamin K in one serving. If you take a multivitamin that contains vitamin K, be sure to take it every day. Meal planning To avoid problems when taking warfarin: Eat a balanced diet that includes: Fresh fruits and vegetables. Whole grains. Low-fat dairy products. Lean proteins, such as fish, eggs, and lean cuts of meat. Avoid major changes in your diet. If you are going to change your diet, talk with your health  care provider before making changes. Keep your intake of vitamin K consistent from day to day. Avoid eating large amounts of vitamin K one day and small amounts of vitamin K the next day. Work with a dietitian to develop a meal plan that works best for you.  What foods are high in vitamin K? Foods that are high in vitamin K contain more than 100 mcg (micrograms) per serving. These include: Broccoli (cooked from fresh) -  cup (78 g) has 110 mcg. Brussels sprouts (cooked from fresh) -  cup (78 g) has 109 mcg. Greens, beet (cooked from fresh) -   cup (72 g) has 350 mcg. Greens, collard (cooked from fresh) -  cup (66 g) has 263 mcg. Greens, turnip (cooked from fresh) -  cup (72 g) has 265 mcg. Green onions or scallions -  cup (50 g) has 105 mcg. Kale (cooked from fresh) -  cup (68 g) has 536 mcg. Parsley (raw) - 10 sprigs (10 g) has 164 mcg. Spinach (cooked from fresh) -  cup (90 g) has 444 mcg. Swiss chard (cooked from fresh) -  cup (88 g) has 287 mcg. The items listed above may not be a complete list of foods high in Vitamin K. Actual amounts of Vitamin K may differ depending on processing. Contact a dietitian for more information. What foods have a moderate amount of vitamin K? Foods that have a moderate amount of vitamin K contain 25-100 mcg per serving. These include: Asparagus (cooked from fresh) - 4 spears (60 g) have 30 mcg. Black-eyed peas (dried) -  cup (85 g) has 32 mcg. Cabbage (cooked from fresh) -  cup (78 g) has 84 mcg. Cabbage (raw) -  cup (35 g) has 26 mcg. Kiwi fruit - 1 medium (69 g) has 27 mcg. Lettuce (raw) - 1 cup (36 g) has 45 mcg. Okra (cooked from fresh) -  cup (80 g) has 32 mcg. Prunes (dried) - 5 prunes (47 g) have 25 mcg. Tuna, light, canned in oil - 3 oz (85 g) has 37 mcg. Watercress (raw) - 1 cup (34 g) has 85 mcg. The items listed above may not be a complete list of foods with a moderate amount of Vitamin K. Actual amounts of Vitamin K may differ depending on processing. Contact a dietitian for more information. What foods are low in vitamin K? Foods low in vitamin K contain less than 25 mcg per serving. These include: Artichoke - 1 medium (128 g) has 18 mcg. Avocado - 1 oz (21 g) has 6 mcg. Blueberries -  cup (73 g) has 14 mcg. Carrots (cooked from fresh) -  cup (78 g) has 11 mcg. Cauliflower (raw) -  cup (54 g) has 8 mcg. Cucumber with peel (raw) -  cup (52 g) has 9 mcg. Grapes -  cup (76 g) has 12 mcg. Mango - 1 medium (207 g) has 9 mcg. Mixed nuts - 1 cup (142 g) has 17  mcg. Pear - 1 medium (178 g) has 8 mcg. Peas (cooked from fresh) -  cup (80 g) has 20 mcg. Pickled cucumber - 1 spear (65 g) has 11 mcg. Sauerkraut (canned) -  cup (118 g) has 16 mcg. Soybeans (cooked from fresh) -  cup (86 g) has 16 mcg. Tomato (raw) - 1 medium (123 g) has 10 mcg. Tomato sauce (raw) -  cup (123 g) has 17 mcg. The items listed above may not be a complete list of foods low in Vitamin K.  Actual amounts of Vitamin K may differ depending on processing. Contact a dietitian for more information. What foods do not have vitamin K? If a food contains less than 5 mcg per serving, it is considered to have no vitamin K. These foods include: Bread and cereal products. Cheese. Eggs. Fish and shellfish. Meat and poultry. Milk and dairy products. Seeds, such as sunflower or pumpkin seeds. The items listed above may not be a complete list of foods that do not have vitamin K. Actual amounts of vitamin K may differ depending on processing. Contact a dietitian for more information. Summary Warfarin is an anticoagulant that prevents blood clots from forming or getting bigger by blocking the activity of vitamin K. It is important to know the amount of vitamin K that is in the foods you eat and to keep your intake of vitamin K consistent from day to day. Avoid major changes in your diet. If you are going to change your diet, talk with your health care provider before making changes. This information is not intended to replace advice given to you by your health care provider. Make sure you discuss any questions you have with your health care provider. Document Revised: 11/10/2020 Document Reviewed: 11/10/2020 Elsevier Patient Education  2023 [employer].

## 2022-06-18 LAB — POCT INR: INR: 2.6 — AB (ref 0.80–1.20)

## 2022-06-18 LAB — URINE CULTURE
MICRO NUMBER:: 13981477
SPECIMEN QUALITY:: ADEQUATE

## 2022-06-18 LAB — URINALYSIS, ROUTINE W REFLEX MICROSCOPIC
Bilirubin Urine: NEGATIVE
Glucose, UA: NEGATIVE
Hgb urine dipstick: NEGATIVE
Ketones, ur: NEGATIVE
Nitrite: NEGATIVE
Specific Gravity, Urine: 1.02 (ref 1.001–1.035)
pH: 6.5 (ref 5.0–8.0)

## 2022-06-18 LAB — MICROSCOPIC MESSAGE

## 2022-06-20 ENCOUNTER — Telehealth: Payer: Self-pay

## 2022-06-20 NOTE — Telephone Encounter (Signed)
INR result for 06/18/2022 is 2.6. pt is currently taking 7 mg daily.   Abstracted.

## 2022-06-26 ENCOUNTER — Telehealth: Payer: Self-pay

## 2022-06-26 LAB — POCT INR: INR: 2.6 — AB (ref <=1.20)

## 2022-06-26 NOTE — Telephone Encounter (Signed)
Spoke to pt in regards to her urine culture results and to ask her if she was interested in getting a CT scan done to see if she has kidney stones. PT stated she will need to think about it she will give [place] a CB

## 2022-06-27 ENCOUNTER — Other Ambulatory Visit: Payer: Self-pay | Admitting: Internal Medicine

## 2022-07-05 LAB — POCT INR: INR: 2.2 — AB (ref 0.80–1.20)

## 2022-07-11 ENCOUNTER — Telehealth: Payer: Self-pay

## 2022-07-11 NOTE — Telephone Encounter (Signed)
INR result on 07/05/2022 was 2.2. Pt is currently taking 7 mg daily.   Abstracted.

## 2022-07-17 LAB — POCT INR: INR: 2.2 — AB (ref <=1.20)

## 2022-07-19 ENCOUNTER — Telehealth: Payer: Self-pay

## 2022-07-19 NOTE — Telephone Encounter (Signed)
INR result from 07/17/2022 was 2.2. pt is currently taking 7 mg daily.   Abstracted.

## 2022-07-31 LAB — POCT INR: INR: 1.9 — AB (ref 0.80–1.20)

## 2022-08-03 ENCOUNTER — Telehealth: Payer: Self-pay

## 2022-08-03 NOTE — Telephone Encounter (Signed)
Pt is aware of message below and medication regimen change.

## 2022-08-03 NOTE — Telephone Encounter (Signed)
INR result for 07/31/2022 is 1.9. Pt is currently taking 7 mg daily.   Abstracted

## 2022-08-13 LAB — POCT INR: INR: 2.8 — AB (ref 0.80–1.20)

## 2022-08-16 ENCOUNTER — Telehealth: Payer: Self-pay

## 2022-08-16 NOTE — Telephone Encounter (Signed)
MyChart message sent  :  Coumadin level is therapeutic,  Continue current regimen and repeat PT/INR in one week

## 2022-08-16 NOTE — Telephone Encounter (Signed)
Also LM with pt.

## 2022-08-16 NOTE — Telephone Encounter (Signed)
INR result for 08/13/2022 is 2.8. Pt is currently taking 8 mg alternating with 7 mg.   Abstracted.

## 2022-08-23 ENCOUNTER — Other Ambulatory Visit: Payer: Self-pay | Admitting: Internal Medicine

## 2022-08-28 NOTE — Telephone Encounter (Signed)
Clint from [facility] called with an out of range number of 4.8 for pt [phone]

## 2022-08-28 NOTE — Telephone Encounter (Signed)
Pt called stating she checked her INR and it was really thin at 4.8. Pt stated she self-medicated herself for a cold to keep from going to a walk in and her son is at [facility] with a diagnose of diabetes 1. Pt would like to be called

## 2022-08-28 NOTE — Telephone Encounter (Signed)
Spoke with pt and she stated that she has been sick over the last two weeks and has also been under a lot of stress with one son in the hospital and one child at home. But pt stated that she had some meds in the counter that she started taking to keep herself from having to go to the doctor. Pt stated that she took flonase, cipro 250 mg, tessalon pearles. Pt's INR now this morning in 4.8.

## 2022-08-28 NOTE — Telephone Encounter (Signed)
Spoke with pt and informed her of her INR dose change. Pt stated that she did not take the coumadin last night so she will start taking it again tomorrow. Pt gave a verbal understanding al to directions.

## 2022-08-29 NOTE — Telephone Encounter (Signed)
MyChart messgae sent to patient. 

## 2022-08-29 NOTE — Progress Notes (Signed)
This encounter was created in error - please disregard.

## 2022-08-30 NOTE — Telephone Encounter (Signed)
Error

## 2022-08-30 NOTE — Telephone Encounter (Signed)
MyChart messgae sent to patient. 

## 2022-09-06 LAB — POCT INR: INR: 3.1 — AB (ref 0.80–1.20)

## 2022-09-07 ENCOUNTER — Telehealth: Payer: Self-pay

## 2022-09-07 NOTE — Telephone Encounter (Signed)
LMTCB

## 2022-09-07 NOTE — Telephone Encounter (Signed)
INR result for 09/06/2022 is 3.1. Pt is currently taking 6 mg since Wednesday. Pt stated that she has been on OTC cold medications and while she was taking those she was only taking 3 mg daily, she stopped the cold medications on Tuesday so on Wednesday she increased to 6 mg.   Abstracted.

## 2022-09-13 ENCOUNTER — Encounter: Payer: Self-pay | Admitting: Internal Medicine

## 2022-09-19 ENCOUNTER — Ambulatory Visit (INDEPENDENT_AMBULATORY_CARE_PROVIDER_SITE_OTHER): Payer: 59 | Admitting: Family

## 2022-09-19 ENCOUNTER — Encounter: Payer: Self-pay | Admitting: Family

## 2022-09-19 VITALS — BP 124/78 | HR 77 | Temp 98.2°F | Ht 59.0 in | Wt 111.2 lb

## 2022-09-19 DIAGNOSIS — J0110 Acute frontal sinusitis, unspecified: Secondary | ICD-10-CM | POA: Diagnosis not present

## 2022-09-19 DIAGNOSIS — J329 Chronic sinusitis, unspecified: Secondary | ICD-10-CM | POA: Insufficient documentation

## 2022-09-19 MED ORDER — FLUCONAZOLE 150 MG PO TABS: 150.0000 mg | ORAL_TABLET | Freq: Once | ORAL | 1 refills | Status: AC

## 2022-09-19 MED ORDER — DOXYCYCLINE HYCLATE 100 MG PO TABS: 100.0000 mg | ORAL_TABLET | Freq: Two times a day (BID) | ORAL | 0 refills | Status: AC

## 2022-09-19 NOTE — Progress Notes (Signed)
Assessment & Plan:  Acute non-recurrent frontal sinusitis Assessment & Plan: Presentation most consistent with frontal/maxillary sinusitis.  However long discussion with patient this could be a presentation for headache as well.  No temporal pain or vision changes to raise concern  temporal arteritis.  We agreed to start doxycycline, probiotics, azelastine.  She will check INR with [facility] INR at home midway through doxycycline course.  We discussed trial of magnesium citrate for headache prevention if headache were to persist. Reiterated the importance of avoiding NSAIDs as she is on coumadin. She will let me know how she is doing.   Orders: -     Doxycycline Hyclate; Take 1 tablet (100 mg total) by mouth 2 (two) times daily for 7 days.  Dispense: 14 tablet; Refill: 0 -     Fluconazole; Take 1 tablet (150 mg total) by mouth once for 1 dose. Take one tablet PO once. If sxs persist, may take one tablet PO 3 days later.  Dispense: 2 tablet; Refill: 1     Return precautions given.   Risks, benefits, and alternatives of the medications and treatment plan prescribed today were discussed, and patient expressed understanding.   Education regarding symptom management and diagnosis given to patient on AVS either electronically or printed.  No follow-ups on file.  Mable Paris, FNP  Subjective:    Patient ID: Sabrina Woodard, female    DOB: [birth year], 47 y.o.   MRN: [id number]  Sabrina Woodard is a 47 y.o. female who presents today for an acute visit.    HPI: She complains of left frontal and maxillary sinus pressure x 1 week. Small amount nasal congestion.  No fever, ear pain, sore throat, sob, nausea, vision loss, vision loss.  Endorses photophobia. She has h/o left sided neck pain.   She had eye exam last week and will start using progressive lenses.  She notes residual cough, occasional x 6 weeks, waxed and waned. She had old dose of ciprofloxacin for 2 days for 5 days with improvement,  not resolution,  x 2 weeks ago,   Sinus pain is worse at end of the day.   She tried tessalon perles without relief. She used albuterol inhaler for cough.  She has taken excredrin, aleve D has helped with pain.  NO h/o migraine.  History of DVT, protein S deficiency.  She is compliant with Coumadin.  Never smoker  Allergies: Penicillins and Sulfa antibiotics Current Outpatient Medications on File Prior to Visit  Medication Sig Dispense Refill   ondansetron (ZOFRAN ODT) 4 MG disintegrating tablet Take 1 tablet (4 mg total) by mouth every 8 (eight) hours as needed for nausea or vomiting. 20 tablet 0   warfarin (COUMADIN) 1 MG tablet TAKE 1 TABLET (1 MG TOTAL) BY MOUTH DAILY. WITH 6 MG TABLET 90 tablet 0   warfarin (COUMADIN) 6 MG tablet TAKE 1 TABLET BY MOUTH EVERY DAY 90 tablet 1   No current facility-administered medications on file prior to visit.    Review of Systems  HENT:  Positive for congestion. Negative for ear pain and sore throat.   Eyes:  Negative for visual disturbance.  Neurological:  Positive for headaches.      Objective:    BP 124/78   Pulse 77   Temp 98.2 F (36.8 C) (Oral)   Ht 4\' 11"  (1.499 m)   Wt 111 lb 3.2 oz (50.4 kg)   LMP  (LMP Unknown)   SpO2 98%   BMI 22.46 kg/m  BP Readings from Last 3 Encounters:  09/19/22 124/78  06/15/22 123/80  06/05/22 (!) 146/83   Wt Readings from Last 3 Encounters:  09/19/22 111 lb 3.2 oz (50.4 kg)  06/15/22 118 lb (53.5 kg)  06/05/22 117 lb (53.1 kg)    Physical Exam Vitals reviewed.  Constitutional:      Appearance: She is well-developed.  HENT:     Head: Normocephalic and atraumatic.     Comments: No temporal pain with palpation    Right Ear: Hearing, tympanic membrane, ear canal and external ear normal. No swelling or tenderness. No middle ear effusion. Tympanic membrane is not erythematous or bulging.     Left Ear: Tympanic membrane, ear canal and external ear normal. No swelling or tenderness.  No  middle ear effusion. Tympanic membrane is not erythematous or bulging.     Nose: No rhinorrhea.     Right Sinus: No maxillary sinus tenderness or frontal sinus tenderness.     Left Sinus: Maxillary sinus tenderness and frontal sinus tenderness present.      Mouth/Throat:     Pharynx: Uvula midline. No posterior oropharyngeal erythema.  Eyes:     General: Lids are normal. Lids are everted, no foreign bodies appreciated.     Conjunctiva/sclera: Conjunctivae normal.     Pupils: Pupils are equal, round, and reactive to light.     Comments: Normal fundus bilaterally   Cardiovascular:     Rate and Rhythm: Normal rate and regular rhythm.     Pulses: Normal pulses.     Heart sounds: Normal heart sounds.  Pulmonary:     Effort: Pulmonary effort is normal.     Breath sounds: Normal breath sounds. No wheezing, rhonchi or rales.  Lymphadenopathy:     Head:     Right side of head: No submental, submandibular, tonsillar, preauricular, posterior auricular or occipital adenopathy.     Left side of head: No submental, submandibular, tonsillar, preauricular, posterior auricular or occipital adenopathy.     Cervical: No cervical adenopathy.     Right cervical: No superficial, deep or posterior cervical adenopathy.    Left cervical: No superficial, deep or posterior cervical adenopathy.  Skin:    General: Skin is warm and dry.  Neurological:     Mental Status: She is alert.     Cranial Nerves: No cranial nerve deficit.     Sensory: No sensory deficit.     Deep Tendon Reflexes:     Reflex Scores:      Bicep reflexes are 2+ on the right side and 2+ on the left side.      Patellar reflexes are 2+ on the right side and 2+ on the left side.    Comments: Grip equal and strong bilateral upper extremities. Gait strong and steady. Able to perform  finger-to-nose without difficulty.   Psychiatric:        Speech: Speech normal.        Behavior: Behavior normal.        Thought Content: Thought content  normal.

## 2022-09-19 NOTE — Patient Instructions (Addendum)
Start doxycycline, antibiotic.  You may also start over-the-counter azelastine nasal spray which is an antihistamine.   Ensure to take probiotics while on antibiotics and also for 2 weeks after completion. This can either be by eating yogurt daily or taking a probiotic supplement over the counter such as Culturelle.It is important to re-colonize the gut with good bacteria and also to prevent any diarrheal infections associated with antibiotic use.   Start magnesium citrate 400mg  daily if headache were to persist for headache prevention.  Often as a daily preventative, I recommend taking magnesium citrate with riboflavin 400mg  daily and CoQ10 100mg  three times daily. You may find these supplements over the counter.    Please let me know how you are doing.

## 2022-09-19 NOTE — Assessment & Plan Note (Addendum)
Presentation most consistent with frontal/maxillary sinusitis.  However long discussion with patient this could be a presentation for headache as well.  No temporal pain or vision changes to raise concern  temporal arteritis.  We agreed to start doxycycline, probiotics, azelastine.  She will check INR with [facility] INR at home midway through doxycycline course.  We discussed trial of magnesium citrate for headache prevention if headache were to persist. Reiterated the importance of avoiding NSAIDs as she is on coumadin. She will let me know how she is doing.

## 2022-09-20 ENCOUNTER — Other Ambulatory Visit: Payer: Self-pay | Admitting: Internal Medicine

## 2022-09-22 LAB — POCT INR: INR: 1.9 — AB (ref 0.80–1.20)

## 2022-09-26 ENCOUNTER — Telehealth: Payer: Self-pay

## 2022-09-26 NOTE — Telephone Encounter (Signed)
LMTCB. Need to find out what dose of coumadin pt is currently taking.    INR result from 09/22/2022 is 1.9.    Abstracted.

## 2022-10-10 LAB — PROTIME-INR: INR: 2.2 — AB (ref 0.80–1.20)

## 2022-10-13 ENCOUNTER — Encounter: Payer: Self-pay | Admitting: Internal Medicine

## 2022-10-27 ENCOUNTER — Encounter: Payer: Self-pay | Admitting: Internal Medicine

## 2022-11-30 LAB — PROTIME-INR: INR: 2 — AB (ref 0.80–1.20)

## 2022-12-01 ENCOUNTER — Encounter: Payer: Self-pay | Admitting: Internal Medicine

## 2022-12-23 ENCOUNTER — Other Ambulatory Visit: Payer: Self-pay | Admitting: Internal Medicine

## 2022-12-24 LAB — POCT INR: INR: 2.6 — AB (ref 0.80–1.20)

## 2022-12-26 ENCOUNTER — Telehealth: Payer: Self-pay

## 2022-12-26 NOTE — Telephone Encounter (Signed)
LMTCB in regards to lab results.  

## 2022-12-26 NOTE — Telephone Encounter (Signed)
-----   Message from Sherlene Shams, MD sent at 12/26/2022 12:26 AM EDT -----  Your INR/coumadin level is therapeutic at 2.6  Continue current regimen and repeat PT/INR in one week .

## 2023-01-21 LAB — POCT INR: INR: 1.9 — AB (ref 2.0–3.0)

## 2023-02-24 ENCOUNTER — Other Ambulatory Visit: Payer: Self-pay | Admitting: Internal Medicine

## 2023-02-26 ENCOUNTER — Encounter: Payer: Self-pay | Admitting: Internal Medicine

## 2023-02-28 ENCOUNTER — Encounter: Payer: Self-pay | Admitting: Internal Medicine

## 2023-03-02 ENCOUNTER — Encounter: Payer: Self-pay | Admitting: Internal Medicine

## 2023-03-18 LAB — POCT INR: INR: 2 — AB (ref 0.80–1.20)

## 2023-03-19 ENCOUNTER — Encounter: Payer: Self-pay | Admitting: Internal Medicine

## 2023-03-19 DIAGNOSIS — Z7901 Long term (current) use of anticoagulants: Secondary | ICD-10-CM

## 2023-03-20 NOTE — Assessment & Plan Note (Signed)
INRis 2.0 on 6/30  via MD INR on  6 mg daily

## 2023-04-20 LAB — POCT INR

## 2023-04-23 ENCOUNTER — Encounter: Payer: Self-pay | Admitting: Internal Medicine

## 2023-04-23 ENCOUNTER — Ambulatory Visit: Payer: 59 | Admitting: Internal Medicine

## 2023-04-23 VITALS — BP 108/62 | HR 63 | Temp 97.8°F | Ht 59.0 in | Wt 118.8 lb

## 2023-04-23 DIAGNOSIS — Z0000 Encounter for general adult medical examination without abnormal findings: Secondary | ICD-10-CM

## 2023-04-23 DIAGNOSIS — N926 Irregular menstruation, unspecified: Secondary | ICD-10-CM

## 2023-04-23 DIAGNOSIS — Z79899 Other long term (current) drug therapy: Secondary | ICD-10-CM

## 2023-04-23 DIAGNOSIS — L508 Other urticaria: Secondary | ICD-10-CM | POA: Insufficient documentation

## 2023-04-23 DIAGNOSIS — Z1231 Encounter for screening mammogram for malignant neoplasm of breast: Secondary | ICD-10-CM | POA: Diagnosis not present

## 2023-04-23 DIAGNOSIS — E782 Mixed hyperlipidemia: Secondary | ICD-10-CM | POA: Diagnosis not present

## 2023-04-23 DIAGNOSIS — Z7901 Long term (current) use of anticoagulants: Secondary | ICD-10-CM | POA: Diagnosis not present

## 2023-04-23 LAB — CBC WITH DIFFERENTIAL/PLATELET
Basophils Absolute: 0.1 10*3/uL (ref 0.0–0.1)
Basophils Relative: 1.2 % (ref 0.0–3.0)
Eosinophils Absolute: 0.3 10*3/uL (ref 0.0–0.7)
Eosinophils Relative: 6.8 % — ABNORMAL HIGH (ref 0.0–5.0)
HCT: 45.3 % (ref 36.0–46.0)
Hemoglobin: 15.1 g/dL — ABNORMAL HIGH (ref 12.0–15.0)
Lymphocytes Relative: 34.3 % (ref 12.0–46.0)
Lymphs Abs: 1.5 10*3/uL (ref 0.7–4.0)
MCHC: 33.4 g/dL (ref 30.0–36.0)
MCV: 91.8 fl (ref 78.0–100.0)
Monocytes Absolute: 0.4 10*3/uL (ref 0.1–1.0)
Monocytes Relative: 8.8 % (ref 3.0–12.0)
Neutro Abs: 2.2 10*3/uL (ref 1.4–7.7)
Neutrophils Relative %: 48.9 % (ref 43.0–77.0)
Platelets: 173 10*3/uL (ref 150.0–400.0)
RBC: 4.94 Mil/uL (ref 3.87–5.11)
RDW: 13.3 % (ref 11.5–15.5)
WBC: 4.5 10*3/uL (ref 4.0–10.5)

## 2023-04-23 LAB — COMPREHENSIVE METABOLIC PANEL
ALT: 22 U/L (ref 0–35)
AST: 34 U/L (ref 0–37)
Albumin: 4.2 g/dL (ref 3.5–5.2)
Alkaline Phosphatase: 65 U/L (ref 39–117)
BUN: 11 mg/dL (ref 6–23)
CO2: 26 mEq/L (ref 19–32)
Calcium: 8.8 mg/dL (ref 8.4–10.5)
Chloride: 105 mEq/L (ref 96–112)
Creatinine, Ser: 0.93 mg/dL (ref 0.40–1.20)
GFR: 72.75 mL/min (ref >60.00)
Glucose, Bld: 89 mg/dL (ref 70–99)
Potassium: 4.5 mEq/L (ref 3.5–5.1)
Sodium: 139 mEq/L (ref 135–145)
Total Bilirubin: 0.5 mg/dL (ref 0.2–1.2)
Total Protein: 6.8 g/dL (ref 6.0–8.3)

## 2023-04-23 LAB — TSH: TSH: 1.84 u[IU]/mL (ref 0.35–5.50)

## 2023-04-23 LAB — HEMOGLOBIN A1C: Hgb A1c MFr Bld: 5.5 % (ref 4.6–6.5)

## 2023-04-23 MED ORDER — TRIAMCINOLONE ACETONIDE 0.1 % EX CREA: 1.0000 | TOPICAL_CREAM | Freq: Two times a day (BID) | CUTANEOUS | 0 refills | Status: DC

## 2023-04-23 NOTE — Progress Notes (Signed)
Patient ID: Sabrina Woodard, female    DOB: 01/19/1976  Age: 47 y.o. MRN: [id number]  The patient is here for annual preventive examination and management of other chronic and acute problems.   The risk factors are reflected in the social history.   The roster of all physicians providing medical care to patient - is listed in the Snapshot section of the chart.   Activities of daily living:  The patient is 100% independent in all ADLs: dressing, toileting, feeding as well as independent mobility   Home safety : The patient has smoke detectors in the home. They wear seatbelts.  There are no unsecured firearms at home. There is no violence in the home.    There is no risks for hepatitis, STDs or HIV. There is no   history of blood transfusion. They have no travel history to infectious disease endemic areas of the world.   The patient has seen their dentist in the last six month. They have seen their eye doctor in the last year. The patinet  denies slight hearing difficulty with regard to whispered voices and some television programs.  They have deferred audiologic testing in the last year.  They do not  have excessive sun exposure. Discussed the need for sun protection: hats, long sleeves and use of sunscreen if there is significant sun exposure.    Diet: the importance of a healthy diet is discussed. They do have a healthy diet.   The benefits of regular aerobic exercise were discussed. The patient  exercises  3 to 5 days per week  for  60 minutes.    Depression screen: there are no signs or vegative symptoms of depression- irritability, change in appetite, anhedonia, sadness/tearfullness.   The following portions of the patient's history were reviewed and updated as appropriate: allergies, current medications, past family history, past medical history,  past surgical history, past social history  and problem list.   Visual acuity was not assessed per patient preference since the patient has  regular follow up with an  ophthalmologist. Hearing and body mass index were assessed and reviewed.    During the course of the visit the patient was educated and counseled about appropriate screening and preventive services including : fall prevention , diabetes screening, nutrition counseling, colorectal cancer screening, and recommended immunizations.    Chief Complaint:  1) itchy skin for several months  using benadryl also helping  her sleep.  .  Develops hives after scratching . Using unscented moisturizers witho no change in symptoms.  Worse at night . no rashes.   2) Headaches occurring premenstrually,  managed with tylenol    Review of Symptoms  Patient denies headache, fevers, malaise, unintentional weight loss, skin rash, eye pain, sinus congestion and sinus pain, sore throat, dysphagia,  hemoptysis , cough, dyspnea, wheezing, chest pain, palpitations, orthopnea, edema, abdominal pain, nausea, melena, diarrhea, constipation, flank pain, dysuria, hematuria, urinary  Frequency, nocturia, numbness, tingling, seizures,  Focal weakness, Loss of consciousness,  Tremor, insomnia, depression, anxiety, and suicidal ideation.    Physical Exam:  BP 108/62   Pulse 63   Temp 97.8 F (36.6 C) (Oral)   Ht 4\' 11"  (1.499 m)   Wt 118 lb 12.8 oz (53.9 kg)   SpO2 98%   BMI 23.99 kg/m    Physical Exam Vitals reviewed.  Constitutional:      General: She is not in acute distress.    Appearance: Normal appearance. She is well-developed and normal weight. She is  not ill-appearing, toxic-appearing or diaphoretic.  HENT:     Head: Normocephalic.     Right Ear: Tympanic membrane, ear canal and external ear normal. There is no impacted cerumen.     Left Ear: Tympanic membrane, ear canal and external ear normal. There is no impacted cerumen.     Nose: Nose normal.     Mouth/Throat:     Mouth: Mucous membranes are moist.     Pharynx: Oropharynx is clear.  Eyes:     General: No scleral icterus.        Right eye: No discharge.        Left eye: No discharge.     Conjunctiva/sclera: Conjunctivae normal.     Pupils: Pupils are equal, round, and reactive to light.  Neck:     Thyroid: No thyromegaly.     Vascular: No carotid bruit or JVD.  Cardiovascular:     Rate and Rhythm: Normal rate and regular rhythm.     Heart sounds: Normal heart sounds.  Pulmonary:     Effort: Pulmonary effort is normal. No respiratory distress.     Breath sounds: Normal breath sounds.  Chest:  Breasts:    Breasts are symmetrical.     Right: Normal. No swelling, inverted nipple, mass, nipple discharge, skin change or tenderness.     Left: Normal. No swelling, inverted nipple, mass, nipple discharge, skin change or tenderness.  Abdominal:     General: Bowel sounds are normal.     Palpations: Abdomen is soft. There is no mass.     Tenderness: There is no abdominal tenderness. There is no guarding or rebound.  Musculoskeletal:        General: Normal range of motion.     Cervical back: Normal range of motion and neck supple.  Lymphadenopathy:     Cervical: No cervical adenopathy.     Upper Body:     Right upper body: No supraclavicular, axillary or pectoral adenopathy.     Left upper body: No supraclavicular, axillary or pectoral adenopathy.  Skin:    General: Skin is warm and dry.  Neurological:     General: No focal deficit present.     Mental Status: She is alert and oriented to person, place, and time. Mental status is at baseline.  Psychiatric:        Mood and Affect: Mood normal.        Behavior: Behavior normal.        Thought Content: Thought content normal.        Judgment: Judgment normal.     Assessment and Plan: Encounter for screening mammogram for malignant neoplasm of breast -     3D Screening Mammogram, Left and Right; Future  Encounter for preventive health examination Assessment & Plan: age appropriate education and counseling updated, referrals for preventative services and  immunizations addressed, dietary and smoking counseling addressed, most recent labs reviewed.  I have personally reviewed and have noted:   1) the patient's medical and social history 2) The pt's use of alcohol, tobacco, and illicit drugs 3) The patient's current medications and supplements 4) Functional ability including ADL's, fall risk, home safety risk, hearing and visual impairment 5) Diet and physical activities 6) Evidence for depression or mood disorder 7) The patient's height, weight, and BMI have been recorded in the chart  I have made referrals, and provided counseling and education based on review of the above    Long-term use of high-risk medication -  CBC with Differential/Platelet  Moderate mixed hyperlipidemia not requiring statin therapy -     Comprehensive metabolic panel -     Hemoglobin A1c  Anticoagulation monitoring, INR range 2-3 Assessment & Plan: INR  is at goal of 2.0 on 8/2  via [facility] on  6 mg daily    Irregular menses -     TSH -     FSH/LH; Future  Chronic urticaria Assessment & Plan: Checking CBC with diff and LFTs    Lab Results  Component Value Date   WBC 4.5 04/23/2023   HGB 15.1 (H) 04/23/2023   HCT 45.3 04/23/2023   MCV 91.8 04/23/2023   PLT 173.0 04/23/2023   Lab Results  Component Value Date   ALT 22 04/23/2023   AST 34 04/23/2023   ALKPHOS 65 04/23/2023   BILITOT 0.5 04/23/2023       Menstrual irregularity Assessment & Plan: Likely perimenopause. Checking TSH/FS/LH    Other orders -     Triamcinolone Acetonide; Apply 1 Application topically 2 (two) times daily.  Dispense: 45 g; Refill: 0    No follow-ups on file.  Sherlene Shams, MD

## 2023-04-23 NOTE — Assessment & Plan Note (Addendum)
Checking CBC with diff and LFTs    Lab Results  Component Value Date   WBC 4.5 04/23/2023   HGB 15.1 (H) 04/23/2023   HCT 45.3 04/23/2023   MCV 91.8 04/23/2023   PLT 173.0 04/23/2023   Lab Results  Component Value Date   ALT 22 04/23/2023   AST 34 04/23/2023   ALKPHOS 65 04/23/2023   BILITOT 0.5 04/23/2023

## 2023-04-23 NOTE — Assessment & Plan Note (Signed)
Likely perimenopause. Checking TSH/FS/LH

## 2023-04-23 NOTE — Assessment & Plan Note (Addendum)
INR  is at goal of 2.0 on 8/2  via [facility] on  6 mg daily

## 2023-04-23 NOTE — Assessment & Plan Note (Signed)

## 2023-04-23 NOTE — Patient Instructions (Addendum)
Your annual mammogram has been ordered [facility] will not allow [place] to schedule it for you,  so please  call to make your appointment [phone]    For the itching:  Save the benadryl for nighttime.  Ok to add zyrtec, allegra or claritin up to 4 times daily as needed for itching  Moisturize daily.  Make sure razor is clean (no rust )

## 2023-05-31 ENCOUNTER — Ambulatory Visit
Admission: RE | Admit: 2023-05-31 | Discharge: 2023-05-31 | Disposition: A | Discharge status: Home / Self Care | Payer: 59 | Source: Ambulatory Visit | Attending: Internal Medicine | Admitting: Internal Medicine

## 2023-05-31 DIAGNOSIS — Z1231 Encounter for screening mammogram for malignant neoplasm of breast: Secondary | ICD-10-CM | POA: Insufficient documentation

## 2023-06-07 ENCOUNTER — Encounter: Payer: Self-pay | Admitting: Internal Medicine

## 2023-08-05 LAB — PROTIME-INR: INR: 3.2 — AB (ref 0.80–1.20)

## 2023-08-08 ENCOUNTER — Telehealth: Payer: Self-pay

## 2023-08-08 NOTE — Telephone Encounter (Signed)
Lvm for pt in regards to holding dosage and labs in 1 week see Dr Darrick Huntsman msg:  ----- Message from Sherlene Shams sent at 08/07/2023 10:35 PM EST ----- Pleaes tell her to skip one day, then resume regular dosing and repeat her INR in one week

## 2023-08-08 NOTE — Telephone Encounter (Signed)
-----   Message from Sherlene Shams sent at 08/07/2023 10:35 PM EST ----- Pleaes tell her to skip one day, then resume regular dosing and repeat her INR in one week ----- Message ----- From: Kristie Cowman, CMA Sent: 08/07/2023   4:12 PM EST To: Sherlene Shams, MD  Pt notified. Pt stated she is taking 6mg  every night. Pt states she took aleve D a couple of days last week and thinks that may be the reason for high INR

## 2023-09-05 LAB — POCT INR: POC INR: 1.8

## 2023-09-06 ENCOUNTER — Telehealth: Payer: Self-pay

## 2023-09-06 NOTE — Telephone Encounter (Signed)
Copied from CRM [id number]. Topic: Medical Record Request - Provider/Facility Request >> Sep 06, 2023  1:41 PM Almira Coaster wrote: Reason for CRM: Cordelia Pen from [facility] is calling to report patient's.  INR is 1.8 since yesterday. Sherry's contact number is [phone]

## 2023-09-07 NOTE — Telephone Encounter (Signed)
LMTCB. Also sent pt a message through mychart.

## 2023-09-08 ENCOUNTER — Encounter: Payer: Self-pay | Admitting: Internal Medicine

## 2023-09-10 NOTE — Telephone Encounter (Signed)
Pt has seen mychart message with directions on new coumadin dosing.

## 2023-09-28 ENCOUNTER — Telehealth: Payer: Self-pay | Admitting: *Deleted

## 2023-09-28 ENCOUNTER — Telehealth: Payer: Self-pay

## 2023-09-28 NOTE — Telephone Encounter (Signed)
 Loetta Rough called from [facility] to state she has Melissa from [facility] on the phone with an INR that is out of range.  Dr. Melina Schools CMA is at lunch.  I transferred call to Rita Ohara, LPN.

## 2023-09-28 NOTE — Telephone Encounter (Signed)
 Copied from CRM [id number]. Topic: General - Other >> Sep 28, 2023  2:21 PM Elizebeth Brooking wrote: Reason for CRM: Patient called in regarding missed call from Nurse, is requesting a callback from nurse

## 2023-09-28 NOTE — Telephone Encounter (Signed)
 Copied from CRM [id number]. Topic: General - Other >> Sep 28, 2023 12:04 PM Desma Mcgregor wrote: Reason for CRM: [facility] calling with an out of range INR from yesterday 09/28/23, but caller disconnected. Please f/u [phone]

## 2023-09-28 NOTE — Telephone Encounter (Signed)
 See result note message

## 2023-09-28 NOTE — Telephone Encounter (Signed)
 See result note message. Dr. Darrick Huntsman is already aware.

## 2023-10-01 ENCOUNTER — Encounter: Payer: Self-pay | Admitting: Family Medicine

## 2023-10-01 ENCOUNTER — Ambulatory Visit: Payer: Self-pay | Admitting: Internal Medicine

## 2023-10-01 ENCOUNTER — Ambulatory Visit (INDEPENDENT_AMBULATORY_CARE_PROVIDER_SITE_OTHER): Payer: 59 | Admitting: Family Medicine

## 2023-10-01 ENCOUNTER — Other Ambulatory Visit
Admission: RE | Admit: 2023-10-01 | Discharge: 2023-10-01 | Disposition: A | Discharge status: Home / Self Care | Payer: 59 | Source: Ambulatory Visit | Attending: Family Medicine | Admitting: Family Medicine

## 2023-10-01 ENCOUNTER — Ambulatory Visit
Admission: RE | Admit: 2023-10-01 | Discharge: 2023-10-01 | Disposition: A | Discharge status: Home / Self Care | Payer: 59 | Source: Ambulatory Visit | Attending: Family Medicine | Admitting: Family Medicine

## 2023-10-01 VITALS — BP 118/70 | HR 69 | Temp 98.1°F | Ht 59.0 in | Wt 119.4 lb

## 2023-10-01 DIAGNOSIS — D6859 Other primary thrombophilia: Secondary | ICD-10-CM | POA: Insufficient documentation

## 2023-10-01 DIAGNOSIS — R791 Abnormal coagulation profile: Secondary | ICD-10-CM | POA: Insufficient documentation

## 2023-10-01 DIAGNOSIS — Z79899 Other long term (current) drug therapy: Secondary | ICD-10-CM | POA: Insufficient documentation

## 2023-10-01 DIAGNOSIS — Z86718 Personal history of other venous thrombosis and embolism: Secondary | ICD-10-CM | POA: Diagnosis present

## 2023-10-01 DIAGNOSIS — R0789 Other chest pain: Secondary | ICD-10-CM

## 2023-10-01 DIAGNOSIS — Z86711 Personal history of pulmonary embolism: Secondary | ICD-10-CM

## 2023-10-01 LAB — CBC WITH DIFFERENTIAL/PLATELET
Abs Immature Granulocytes: 0.01 10*3/uL (ref 0.00–0.07)
Basophils Absolute: 0.1 10*3/uL (ref 0.0–0.1)
Basophils Relative: 1 %
Eosinophils Absolute: 0.1 10*3/uL (ref 0.0–0.5)
Eosinophils Relative: 2 %
HCT: 45.2 % (ref 36.0–46.0)
Hemoglobin: 15.3 g/dL — ABNORMAL HIGH (ref 12.0–15.0)
Immature Granulocytes: 0 %
Lymphocytes Relative: 23 %
Lymphs Abs: 1.2 10*3/uL (ref 0.7–4.0)
MCH: 30.9 pg (ref 26.0–34.0)
MCHC: 33.8 g/dL (ref 30.0–36.0)
MCV: 91.3 fL (ref 80.0–100.0)
Monocytes Absolute: 0.3 10*3/uL (ref 0.1–1.0)
Monocytes Relative: 6 %
Neutro Abs: 3.6 10*3/uL (ref 1.7–7.7)
Neutrophils Relative %: 68 %
Platelets: 194 10*3/uL (ref 150–400)
RBC: 4.95 MIL/uL (ref 3.87–5.11)
RDW: 12.3 % (ref 11.5–15.5)
WBC: 5.3 10*3/uL (ref 4.0–10.5)
nRBC: 0 % (ref 0.0–0.2)

## 2023-10-01 LAB — COMPREHENSIVE METABOLIC PANEL
ALT: 21 U/L (ref 0–44)
AST: 26 U/L (ref 15–41)
Albumin: 4.1 g/dL (ref 3.5–5.0)
Alkaline Phosphatase: 52 U/L (ref 38–126)
Anion gap: 10 (ref 5–15)
BUN: 13 mg/dL (ref 6–20)
CO2: 23 mmol/L (ref 22–32)
Calcium: 8.4 mg/dL — ABNORMAL LOW (ref 8.9–10.3)
Chloride: 104 mmol/L (ref 98–111)
Creatinine, Ser: 0.82 mg/dL (ref 0.44–1.00)
GFR, Estimated: >60 mL/min (ref >60)
Glucose, Bld: 99 mg/dL (ref 70–99)
Potassium: 4.1 mmol/L (ref 3.5–5.1)
Sodium: 137 mmol/L (ref 135–145)
Total Bilirubin: 0.8 mg/dL (ref 0.0–1.2)
Total Protein: 6.8 g/dL (ref 6.5–8.1)

## 2023-10-01 LAB — PROTIME-INR
INR: 1.7 — ABNORMAL HIGH (ref 0.8–1.2)
Prothrombin Time: 20.4 s — ABNORMAL HIGH (ref 11.4–15.2)

## 2023-10-01 MED ORDER — IOHEXOL 350 MG/ML SOLN
75.0000 mL | Freq: Once | INTRAVENOUS | Status: AC | PRN
  Administered 2023-10-01: 75 mL via INTRAVENOUS

## 2023-10-01 MED ORDER — IOHEXOL 350 MG/ML SOLN: 75.0000 mL | Freq: Once | INTRAVENOUS | Status: DC | PRN

## 2023-10-01 NOTE — Telephone Encounter (Signed)
 Copied from CRM [id number]. Topic: Clinical - Pink Word Triage >> Oct 01, 2023  8:03 AM Joanell B wrote: Reason for Triage: Pt stated that she is having pain in the upper right corner of her back, and has little bit of welling on her left leg since Thursday. She stated that she is needing INR f/u.  Chief Complaint: back pain & leg swelling Symptoms: upper right back pain and left leg since, INR 1.6 Frequency: Thursday Pertinent Negatives: Patient denies leg drainage Disposition: [] ED /[] Urgent Care (no appt availability in office) / [x] Appointment(In office/virtual)/ []  [place] [facility]/ [] Home Care/ [] Refused Recommended Disposition /[]  Mobile Bus/ []  Follow-up with PCP Additional Notes:  No: pt concerned she has been taking coumadin  7 mg since Friday checked INR 1.6 on Thursday & 1.8 month ago : has been having left leg swelling with no drainage or redness  Reason for Disposition  [1] SEVERE back pain (e.g., excruciating, unable to do any normal activities) AND [2] not improved 2 hours after pain medicine    Pt also c/o left leg swelling & not taking coumadin  as prescribed since Friday: pt now having concerns of INR not within range  Answer Assessment - Initial Assessment Questions 1. ONSET: When did the pain begin?      Thursday 2. LOCATION: Where does it hurt? (upper, mid or lower back)     upper right corner of her back, 3. SEVERITY: How bad is the pain?  (e.g., Scale 1-10; mild, moderate, or severe)   - MILD (1-3): Doesn't interfere with normal activities.    - MODERATE (4-7): Interferes with normal activities or awakens from sleep.    - SEVERE (8-10): Excruciating pain, unable to do any normal activities.      4/10 4. PATTERN: Is the pain constant? (e.g., yes, no; constant, intermittent)      constant 5. RADIATION: Does the pain shoot into your legs or somewhere else?     Radiates from back to front at times 6. CAUSE:  What do you think is causing the  back pain?      unknown 7. BACK OVERUSE:  Any recent lifting of heavy objects, strenuous work or exercise?     Could possibly related to exercise  8. MEDICINES: What have you taken so far for the pain? (e.g., nothing, acetaminophen , NSAIDS)     no 9. NEUROLOGIC SYMPTOMS: Do you have any weakness, numbness, or problems with bowel/bladder control?     no 10. OTHER SYMPTOMS: Do you have any other symptoms? (e.g., fever, abdomen pain, burning with urination, blood in urine)       No: pt concerned she has been taking coumadin  7 mg since Friday checked INR 1.6 on Thursday & 1.8 month ago : has been having left leg swelling with no drainage or redness 11. PREGNANCY: Is there any chance you are pregnant? When was your last menstrual period?       no  Protocols used: Back Pain-A-AH

## 2023-10-01 NOTE — Telephone Encounter (Signed)
 Pt is scheduled with Dr. Clent Ridges today

## 2023-10-01 NOTE — Addendum Note (Signed)
 Addended by: Enid Cutter on: 10/01/2023 12:43 PM   Modules accepted: Level of Service

## 2023-10-01 NOTE — Assessment & Plan Note (Signed)
 Currently on Warfarin 7 mg daily. Subtherapeutic INR (1.6) with a history of DV/PE and Protein C deficiency. Patient reported transient left leg swelling and heaviness. Trace left leg swelling or pain. -Check INR today. -Warfarin dose adjustment based on INR results.

## 2023-10-01 NOTE — Telephone Encounter (Signed)
Noted pt seen in office

## 2023-10-01 NOTE — Patient Instructions (Addendum)
 It was a pleasure meeting you today. Thank you for allowing me to take part in your health care.  Our goals for today as we discussed include:  Will get blood work today Please go to [facility] outpatient lab to have these labs completed [address]  Will get imaging of chest.  They will call you with appointment.     This is a list of the screening recommended for you and due dates:  Health Maintenance  Topic Date Due   COVID-19 Vaccine (1) Never done   Flu Shot  12/17/2023*   Mammogram  05/30/2024   Cologuard (Stool DNA test)  05/06/2025   Pap with HPV screening  04/20/2027   DTaP/Tdap/Td vaccine (3 - Td or Tdap) 04/19/2032   Hepatitis C Screening  Completed   HIV Screening  Completed   HPV Vaccine  Aged Out  *Topic was postponed. The date shown is not the original due date.    Follow up with PCP as needed   If you have any questions or concerns, please do not hesitate to call the office at [phone].  I look forward to our next visit and until then take care and stay safe.  Regards,   Glenys Ferrari, MD   [facility]

## 2023-10-01 NOTE — Progress Notes (Addendum)
 SUBJECTIVE:   Chief Complaint  Patient presents with   Leg Swelling   HPI Presents for acute visit  Discussed the use of AI scribe software for clinical note transcription with the patient, who gave verbal consent to proceed.  History of Present Illness The patient, with a known history of Protein C deficiency and on long-term warfarin therapy, presented with a recent episode of palpitations and a sensation of a racing heart. This was followed by a feeling of heaviness and throbbing in the left leg, which was transient and resolved by the next morning. The patient also reported a drop in their home-monitored INR from 1.8 to 1.6, which is below their usual therapeutic range of 2-3.  The patient then began experiencing a dull ache and tightness in the chest, which was felt from the front to the back. This sensation was noted to move or radiate with breathing. There was no associated difficulty in breathing, but the patient reported feeling slightly more short of breath than usual.  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 They also reported a history of clot formation during pregnancy, despite being on Lovenox  at the time. The patient denied any fever or other systemic symptoms.   PERTINENT PMH / PSH: As above  OBJECTIVE:  BP 118/70   Pulse 69   Temp 98.1 F (36.7 C)   Ht 4' 11 (1.499 m)   Wt 119 lb 6.4 oz (54.2 kg)   SpO2 96%   BMI 24.12 kg/m    Physical Exam Vitals reviewed.  Constitutional:      General: She is not in acute distress.    Appearance: Normal appearance. She is normal weight. She is not ill-appearing, toxic-appearing or diaphoretic.  Eyes:     General:        Right eye: No discharge.        Left eye: No discharge.     Conjunctiva/sclera: Conjunctivae normal.  Cardiovascular:     Rate and Rhythm: Normal rate and regular rhythm.     Heart sounds: Normal heart sounds.  Pulmonary:     Effort:  Pulmonary effort is normal.     Breath sounds: Normal breath sounds.  Abdominal:     General: Bowel sounds are normal.  Musculoskeletal:        General: Normal range of motion.     Left lower leg: Edema (trace) present.  Skin:    General: Skin is warm and dry.  Neurological:     General: No focal deficit present.     Mental Status: She is alert and oriented to person, place, and time. Mental status is at baseline.  Psychiatric:        Mood and Affect: Mood normal.        Behavior: Behavior normal.        Thought Content: Thought content normal.        Judgment: Judgment normal.        04/23/2023   10:39 AM 09/19/2022   11:22 AM 06/15/2022   10:18 AM 04/19/2022    2:26 PM 07/26/2018    8:49 AM  Depression screen PHQ 2/9  Decreased Interest 0 0 0 0 0  Down, Depressed, Hopeless 0 0 0 1 0  PHQ - 2 Score 0 0 0 1 0  Altered sleeping  0   1  Tired, decreased energy  1   1  Change in appetite  1  0  Feeling bad or failure about yourself   0   1  Trouble concentrating  1   1  Moving slowly or fidgety/restless  0   0  Suicidal thoughts  0   0  PHQ-9 Score  3   4  Difficult doing work/chores  Not difficult at all   Not difficult at all      09/19/2022   11:22 AM  GAD 7 : Generalized Anxiety Score  Nervous, Anxious, on Edge 1  Control/stop worrying 1  Worry too much - different things 1  Trouble relaxing 1  Restless 0  Easily annoyed or irritable 0  Afraid - awful might happen 0  Total GAD 7 Score 4  Anxiety Difficulty Not difficult at all    ASSESSMENT/PLAN:  Chest discomfort Assessment & Plan: New onset of right chest discomfort with a sensation of tightness and dull ache. No worsening of symptoms. No significant shortness of breath. Given the history of Protein C deficiency and subtherapeutic INR, there is a concern for pulmonary embolism. -CTA chest today to rule out pulmonary embolism. -INR, CBC, CMET stat  Orders: -     CBC with Differential/Platelet; Future -      Comprehensive metabolic panel; Future  Subtherapeutic international normalized ratio (INR) Assessment & Plan: Currently on Warfarin 7 mg daily. Subtherapeutic INR (1.6) with a history of DV/PE and Protein C deficiency. Patient reported transient left leg swelling and heaviness. Trace left leg swelling or pain. -Check INR today. -Warfarin dose adjustment based on INR results.  Orders: -     CT Angio Chest Pulmonary Embolism (PE) W or WO Contrast; Future -     Protime-INR; Future  Long-term use of high-risk medication -     Protime-INR; Future  History of pulmonary embolism -     CT Angio Chest Pulmonary Embolism (PE) W or WO Contrast; Future  Protein C deficiency (HCC) -     CT Angio Chest Pulmonary Embolism (PE) W or WO Contrast; Future  History of DVT (deep vein thrombosis) -     CT Angio Chest Pulmonary Embolism (PE) W or WO Contrast; Future   PDMP reviewed  Return if symptoms worsen or fail to improve, for PCP.  Addendum @1732  CTA chest negative for PE INR 1.7 Patient notified of results.  Increase Warfarin to 9 mg today then start Warfarin 8 mg tomorrow daily for 7 days.  Repeat INR Jan 21 and notify MD of results to adjust medication accordingly.    Glenys Ferrari, MD

## 2023-10-01 NOTE — Assessment & Plan Note (Signed)
 New onset of right chest discomfort with a sensation of tightness and dull ache. No worsening of symptoms. No significant shortness of breath. Given the history of Protein C deficiency and subtherapeutic INR, there is a concern for pulmonary embolism. -CTA chest today to rule out pulmonary embolism. -INR, CBC, CMET stat

## 2023-10-02 ENCOUNTER — Other Ambulatory Visit: Payer: Self-pay | Admitting: Internal Medicine

## 2023-10-10 ENCOUNTER — Encounter: Payer: Self-pay | Admitting: Internal Medicine

## 2023-10-10 LAB — POCT INR

## 2023-11-21 ENCOUNTER — Encounter: Payer: Self-pay | Admitting: Internal Medicine

## 2023-11-22 ENCOUNTER — Encounter: Payer: Self-pay | Admitting: Internal Medicine

## 2023-12-24 LAB — POCT INR

## 2023-12-25 ENCOUNTER — Encounter: Payer: Self-pay | Admitting: Internal Medicine

## 2023-12-26 ENCOUNTER — Encounter: Payer: Self-pay | Admitting: Internal Medicine

## 2024-01-30 LAB — POCT INR

## 2024-01-31 ENCOUNTER — Ambulatory Visit: Payer: Self-pay | Admitting: Internal Medicine

## 2024-03-19 ENCOUNTER — Telehealth: Payer: Self-pay

## 2024-03-19 DIAGNOSIS — Z7901 Long term (current) use of anticoagulants: Secondary | ICD-10-CM

## 2024-03-19 NOTE — Telephone Encounter (Signed)
 No antibiotics and no new medications. Pt stated she is currently taking 8 mg daily. Pt also stated that she did not take the coumadin  last night.

## 2024-03-19 NOTE — Assessment & Plan Note (Signed)
 INR was 4.5 on 8 mg daily dose of coumadin .  Advised to suspend for 2 doses and resume on Thursday July 3 at 7 mg daily.  Repeat INR In one week

## 2024-03-19 NOTE — Telephone Encounter (Signed)
 Copied from CRM [id number]. Topic: General - Other >> Mar 19, 2024 11:57 AM Lavanda D wrote: Reason for CRM: Joen [facility] report an INR for Ms. Hornbeck a 4.5 for yesterday. P#: [phone]

## 2024-03-19 NOTE — Telephone Encounter (Signed)
 Pt is aware of Dr. Lula message below and gave a verbal understanding.

## 2024-03-31 ENCOUNTER — Encounter: Payer: Self-pay | Admitting: Internal Medicine

## 2024-03-31 DIAGNOSIS — Z7901 Long term (current) use of anticoagulants: Secondary | ICD-10-CM

## 2024-03-31 LAB — POCT INR

## 2024-04-01 ENCOUNTER — Ambulatory Visit: Payer: Self-pay | Admitting: Internal Medicine

## 2024-04-01 NOTE — Assessment & Plan Note (Signed)
 Inr is stil high on 7 ng dally,  advised to reduce dose to 6 mg MWF and continue 7 mg on  T Th Sat and Sun

## 2024-04-27 LAB — POCT INR: INR: 1.1 (ref 0.80–1.20)

## 2024-04-28 ENCOUNTER — Encounter: Payer: Self-pay | Admitting: Internal Medicine

## 2024-04-28 ENCOUNTER — Telehealth: Payer: Self-pay | Admitting: *Deleted

## 2024-04-28 NOTE — Telephone Encounter (Signed)
 CRITICAL VALUE STICKER  CRITICAL VALUE: POCT INR-1.1  RECEIVER (on-site recipient of call): Sharene ORN, CMA  DATE & TIME NOTIFIED: 12:14pm on 04/28/24  MESSENGER (representative from lab): [facility]  MD NOTIFIED: Dr Onesimo (in absence of Dr Marylynn)  TIME OF NOTIFICATION: 12:14pm  RESPONSE:

## 2024-04-28 NOTE — Telephone Encounter (Signed)
 Patient states she is staying on her current dose due to missing 2 doses last week due to camp.

## 2024-04-29 NOTE — Telephone Encounter (Signed)
 Spoke with pt to advise her to double her dose for tonight and then resume normal regimen. Pt gave a verbal understanding stating that she will take 14 mg tonight and then resume the 6 mg on Mondays, Wednesdays, and Saturdays and 7 mg all other days.

## 2024-04-29 NOTE — Telephone Encounter (Signed)
 LMTCB

## 2024-05-10 ENCOUNTER — Other Ambulatory Visit: Payer: Self-pay | Admitting: Internal Medicine

## 2024-06-17 ENCOUNTER — Ambulatory Visit: Payer: Self-pay | Admitting: Internal Medicine

## 2024-07-23 LAB — POCT INR: INR: 2.5 — AB (ref 0.80–1.20)

## 2024-07-24 ENCOUNTER — Telehealth: Payer: Self-pay | Admitting: Internal Medicine

## 2024-07-24 NOTE — Telephone Encounter (Signed)
 INR result for 07/23/2024 is 2.5. Abstracted.

## 2024-07-24 NOTE — Telephone Encounter (Signed)
 INR result sent to S drive.

## 2024-08-11 ENCOUNTER — Other Ambulatory Visit: Payer: Self-pay | Admitting: Internal Medicine

## 2024-08-11 DIAGNOSIS — Z1231 Encounter for screening mammogram for malignant neoplasm of breast: Secondary | ICD-10-CM

## 2024-08-18 ENCOUNTER — Ambulatory Visit
Admission: RE | Admit: 2024-08-18 | Discharge: 2024-08-18 | Disposition: A | Source: Ambulatory Visit | Attending: Internal Medicine | Admitting: Internal Medicine

## 2024-08-18 DIAGNOSIS — Z1231 Encounter for screening mammogram for malignant neoplasm of breast: Secondary | ICD-10-CM | POA: Diagnosis present

## 2024-09-24 ENCOUNTER — Encounter

## 2024-10-14 ENCOUNTER — Telehealth: Payer: Self-pay | Admitting: Internal Medicine

## 2024-10-14 LAB — POCT INR: INR: 2.1 — AB (ref 0.80–1.20)

## 2024-10-14 NOTE — Telephone Encounter (Signed)
 LMTCB

## 2024-10-14 NOTE — Telephone Encounter (Signed)
 Copied from CRM #[id number]. Topic: General - Other >> Oct 14, 2024  9:41 AM Pinkey ORN wrote: Reason for CRM: INR Testing

## 2024-10-14 NOTE — Telephone Encounter (Signed)
 Copied from CRM #[id number]. Topic: General - Call Back - No Documentation >> Oct 14, 2024 11:37 AM [name] wrote: Reason for CRM: Patient is returning call to speak with Harlene in regards to INR testing.   [phone] (M)

## 2024-10-14 NOTE — Telephone Encounter (Signed)
 INR received and sent to S drive.

## 2024-10-14 NOTE — Telephone Encounter (Signed)
 Pt's INR for 10/14/2024 is 2.1.   Abstracted.

## 2024-10-14 NOTE — Telephone Encounter (Signed)
 See previous message

## 2024-10-15 NOTE — Telephone Encounter (Signed)
 Pt is aware and gave a verbal understanding.
